# Patient Record
Sex: Male | Born: 1945 | Race: White | Hispanic: No | Marital: Married | Smoking: Former smoker
Health system: Southern US, Community
[De-identification: ages and names within clinical notes are randomized; demographics above are authoritative.]

## PROBLEM LIST (undated history)

## (undated) DIAGNOSIS — H6120 Impacted cerumen, unspecified ear: Secondary | ICD-10-CM

## (undated) DIAGNOSIS — I639 Cerebral infarction, unspecified: Secondary | ICD-10-CM

## (undated) DIAGNOSIS — E785 Hyperlipidemia, unspecified: Secondary | ICD-10-CM

## (undated) DIAGNOSIS — R7309 Other abnormal glucose: Secondary | ICD-10-CM

## (undated) HISTORY — DX: Hyperlipidemia, unspecified: E78.5

## (undated) HISTORY — DX: Impacted cerumen, unspecified ear: H61.20

## (undated) HISTORY — DX: Other abnormal glucose: R73.09

## (undated) HISTORY — DX: Cerebral infarction, unspecified: I63.9

## (undated) HISTORY — PX: TONSILLECTOMY: SUR1361

---

## 1999-08-21 ENCOUNTER — Encounter (INDEPENDENT_AMBULATORY_CARE_PROVIDER_SITE_OTHER): Payer: Self-pay | Admitting: *Deleted

## 1999-08-21 ENCOUNTER — Ambulatory Visit (HOSPITAL_BASED_OUTPATIENT_CLINIC_OR_DEPARTMENT_OTHER): Admission: RE | Admit: 1999-08-21 | Discharge: 1999-08-21 | Payer: Self-pay | Admitting: Urology

## 2003-06-05 ENCOUNTER — Ambulatory Visit (HOSPITAL_COMMUNITY): Admission: RE | Admit: 2003-06-05 | Discharge: 2003-06-05 | Payer: Self-pay | Admitting: Gastroenterology

## 2003-06-05 ENCOUNTER — Encounter (INDEPENDENT_AMBULATORY_CARE_PROVIDER_SITE_OTHER): Payer: Self-pay | Admitting: Specialist

## 2005-05-06 ENCOUNTER — Emergency Department (HOSPITAL_COMMUNITY): Admission: EM | Admit: 2005-05-06 | Discharge: 2005-05-06 | Payer: Self-pay | Admitting: Emergency Medicine

## 2008-09-27 LAB — HM COLONOSCOPY: HM Colonoscopy: NORMAL

## 2008-12-25 ENCOUNTER — Encounter: Payer: Self-pay | Admitting: Family Medicine

## 2009-06-06 ENCOUNTER — Ambulatory Visit: Payer: Self-pay | Admitting: Family Medicine

## 2009-06-06 DIAGNOSIS — E785 Hyperlipidemia, unspecified: Secondary | ICD-10-CM

## 2009-06-06 HISTORY — DX: Hyperlipidemia, unspecified: E78.5

## 2009-06-09 LAB — CONVERTED CEMR LAB
AST: 18 units/L (ref 0–37)
Alkaline Phosphatase: 32 units/L — ABNORMAL LOW (ref 39–117)
BUN: 13 mg/dL (ref 6–23)
CO2: 31 meq/L (ref 19–32)
Creatinine, Ser: 0.9 mg/dL (ref 0.4–1.5)
Eosinophils Relative: 4.1 % (ref 0.0–5.0)
GFR calc non Af Amer: 90.01 mL/min (ref >60)
HCT: 43.4 % (ref 39.0–52.0)
HDL: 30.1 mg/dL — ABNORMAL LOW (ref >39.00)
Neutro Abs: 2.9 10*3/uL (ref 1.4–7.7)
Platelets: 244 10*3/uL (ref 150.0–400.0)
Potassium: 5.1 meq/L (ref 3.5–5.1)
RBC: 4.78 M/uL (ref 4.22–5.81)
RDW: 12.6 % (ref 11.5–14.6)
Sodium: 140 meq/L (ref 135–145)
Total CHOL/HDL Ratio: 7
Triglycerides: 178 mg/dL — ABNORMAL HIGH (ref 0.0–149.0)

## 2009-06-18 ENCOUNTER — Telehealth: Payer: Self-pay | Admitting: Family Medicine

## 2009-07-23 ENCOUNTER — Telehealth: Payer: Self-pay | Admitting: Family Medicine

## 2009-07-25 ENCOUNTER — Ambulatory Visit: Payer: Self-pay | Admitting: Family Medicine

## 2009-07-25 ENCOUNTER — Telehealth: Payer: Self-pay | Admitting: Family Medicine

## 2009-07-30 ENCOUNTER — Ambulatory Visit: Payer: Self-pay | Admitting: Family Medicine

## 2009-07-30 ENCOUNTER — Telehealth: Payer: Self-pay | Admitting: Family Medicine

## 2009-08-22 ENCOUNTER — Ambulatory Visit: Payer: Self-pay | Admitting: Family Medicine

## 2009-08-22 DIAGNOSIS — H6120 Impacted cerumen, unspecified ear: Secondary | ICD-10-CM

## 2009-08-22 HISTORY — DX: Impacted cerumen, unspecified ear: H61.20

## 2010-07-06 ENCOUNTER — Ambulatory Visit: Payer: Self-pay | Admitting: Family Medicine

## 2010-07-06 LAB — CONVERTED CEMR LAB
Basophils Relative: 1.2 % (ref 0.0–3.0)
CO2: 30 meq/L (ref 19–32)
Calcium: 9.6 mg/dL (ref 8.4–10.5)
Chloride: 101 meq/L (ref 96–112)
Cholesterol: 206 mg/dL — ABNORMAL HIGH (ref 0–200)
Creatinine, Ser: 1 mg/dL (ref 0.4–1.5)
Eosinophils Absolute: 0.2 10*3/uL (ref 0.0–0.7)
GFR calc non Af Amer: 81.31 mL/min (ref >60)
Glucose, Bld: 104 mg/dL — ABNORMAL HIGH (ref 70–99)
HCT: 41.7 % (ref 39.0–52.0)
HDL: 30.1 mg/dL — ABNORMAL LOW (ref >39.00)
Hemoglobin: 14.2 g/dL (ref 13.0–17.0)
Lymphocytes Relative: 31.6 % (ref 12.0–46.0)
Monocytes Absolute: 0.5 10*3/uL (ref 0.1–1.0)
PSA: 0.48 ng/mL (ref 0.10–4.00)
Potassium: 4.8 meq/L (ref 3.5–5.1)
RBC: 4.57 M/uL (ref 4.22–5.81)
RDW: 13.5 % (ref 11.5–14.6)
Sodium: 138 meq/L (ref 135–145)
Specific Gravity, Urine: 1.02
TSH: 0.59 microintl units/mL (ref 0.35–5.50)
Total Protein: 6.6 g/dL (ref 6.0–8.3)
WBC Urine, dipstick: NEGATIVE
WBC: 5.6 10*3/uL (ref 4.5–10.5)
pH: 7

## 2010-07-13 ENCOUNTER — Ambulatory Visit: Payer: Self-pay | Admitting: Family Medicine

## 2010-07-13 DIAGNOSIS — R7309 Other abnormal glucose: Secondary | ICD-10-CM | POA: Insufficient documentation

## 2010-07-13 HISTORY — DX: Other abnormal glucose: R73.09

## 2010-07-13 LAB — CONVERTED CEMR LAB
Cholesterol, target level: 200 mg/dL
HDL goal, serum: 40 mg/dL
LDL Goal: 130 mg/dL

## 2010-10-15 NOTE — Assessment & Plan Note (Signed)
Summary: cpx//ccm   Vital Signs:  Patient profile:   65 year old male Height:      66 inches Weight:      172 pounds Temp:     98.4 degrees F oral BP sitting:   140 / 72  (left arm) Cuff size:   regular  Vitals Entered By: Sid Falcon LPN (July 13, 2010 8:55 AM)  History of Present Illness: Here for Medicare wellness visit.  Poor dietary compliance recently. colonoscopy last year.  No hx Shingles vaccine. Tetanus and PVX up to date.  Also has hx of prediabetes.  Father had Type 2 DM.  Pt without symptoms of hyperglycemia. Hx hyperlipidemia but not treated with meds.  Here for Medicare AWV:  1.   Risk factors based on Past M, S, F history:  Hx of hyperlipidemia and prediabetes.  FH of CAD in father but no premature CAD.  Father also had hx type 2 diabetes. 2.   Physical Activities: stays very active but no formal exercise. 3.   Depression/mood: No anxiety or depression issues. 4.   Hearing: No defecits..   5.   ADL's: Fully independent in all ADLs. 6.   Fall Risk: VEry low.  No recent falls.  No major orthopedic, vestibular, or visual difficulty (vision is corrected). 7.   Home Safety: no concerns identified. 8.   Height, weight, &visual acuity:  Some mild weight gain this year.  Ht stable.  regular eye checks.  no visual changes. 9.   Counseling: Needs to lose some weight and exercise more.  consider shingles vaccine . 10.   Labs ordered based on risk factors: Pt had lipids, BMP,CBC, TSH, PSA. 11.           Referral Coordination  No referrals indicated at this time. 12.           Care Plan  Flu vaccine. Rec consider shingles vaccine.  Colonoscopy up to date. 13.            Cognitive Assessment  No difficulty with short term, long term memory, or reasoning skills.   Lipid Management History:      Positive NCEP/ATP III risk factors include male age 70 years old or older and HDL cholesterol less than 40.  Negative NCEP/ATP III risk factors include non-diabetic, no family  history for ischemic heart disease, non-tobacco-user status, non-hypertensive, no ASHD (atherosclerotic heart disease), no prior stroke/TIA, no peripheral vascular disease, and no history of aortic aneurysm.     Preventive Screening-Counseling & Management  Alcohol-Tobacco     Smoking Status: never  Clinical Review Panels:  Prevention   Last Colonoscopy:  normal (09/13/2008)   Last PSA:  0.48 (07/06/2010)  Immunizations   Last Tetanus Booster:  Historical (09/13/2004)   Last Flu Vaccine:  Fluvax 3+ (07/13/2010)   Last Pneumovax:  Pneumovax (Medicare) (06/06/2009)  Lipid Management   Cholesterol:  206 (07/06/2010)   HDL (good cholesterol):  30.10 (07/06/2010)  Diabetes Management   Creatinine:  1.0 (07/06/2010)   Last Flu Vaccine:  Fluvax 3+ (07/13/2010)   Last Pneumovax:  Pneumovax (Medicare) (06/06/2009)  CBC   WBC:  5.6 (07/06/2010)   RBC:  4.57 (07/06/2010)   Hgb:  14.2 (07/06/2010)   Hct:  41.7 (07/06/2010)   Platelets:  250.0 (07/06/2010)   MCV  91.3 (07/06/2010)   MCHC  34.1 (07/06/2010)   RDW  13.5 (07/06/2010)   PMN:  55.0 (07/06/2010)   Lymphs:  31.6 (07/06/2010)   Monos:  8.7 (07/06/2010)  Eosinophils:  3.5 (07/06/2010)   Basophil:  1.2 (07/06/2010)  Complete Metabolic Panel   Glucose:  104 (07/06/2010)   Sodium:  138 (07/06/2010)   Potassium:  4.8 (07/06/2010)   Chloride:  101 (07/06/2010)   CO2:  30 (07/06/2010)   BUN:  16 (07/06/2010)   Creatinine:  1.0 (07/06/2010)   Albumin:  3.7 (07/06/2010)   Total Protein:  6.6 (07/06/2010)   Calcium:  9.6 (07/06/2010)   Total Bili:  0.5 (07/06/2010)   Alk Phos:  38 (07/06/2010)   SGPT (ALT):  36 (07/06/2010)   SGOT (AST):  24 (07/06/2010)   Allergies (verified): No Known Drug Allergies  Past History:  Past Medical History: Last updated: 06/06/2009 Hyperlipidemia  Past Surgical History: Last updated: 06/06/2009 Tonsillectomy 1955  Family History: Last updated: 07/13/2010 Family History of  Alcoholism/Addiction Family History of Stroke Father Family history heart disease Father 59 Father type 2 DM  Social History: Last updated: 06/06/2009 Retired Married Alcohol use-no Has smoked in the past  Family History: Family History of Alcoholism/Addiction Family History of Stroke Father Family history heart disease Father 46 Father type 2 DM  Social History: Smoking Status:  never  Review of Systems  The patient denies anorexia, fever, weight loss, vision loss, decreased hearing, hoarseness, chest pain, syncope, dyspnea on exertion, peripheral edema, prolonged cough, headaches, hemoptysis, abdominal pain, melena, hematochezia, severe indigestion/heartburn, hematuria, incontinence, genital sores, muscle weakness, suspicious skin lesions, transient blindness, difficulty walking, depression, unusual weight change, abnormal bleeding, enlarged lymph nodes, and testicular masses.    Physical Exam  General:  Well-developed,well-nourished,in no acute distress; alert,appropriate and cooperative throughout examination Head:  Normocephalic and atraumatic without obvious abnormalities. No apparent alopecia or balding. Eyes:  No corneal or conjunctival inflammation noted. EOMI. Perrla. Funduscopic exam benign, without hemorrhages, exudates or papilledema. Vision grossly normal. Ears:  External ear exam shows no significant lesions or deformities.  Otoscopic examination reveals clear canals, tympanic membranes are intact bilaterally without bulging, retraction, inflammation or discharge. Hearing is grossly normal bilaterally. Mouth:  Oral mucosa and oropharynx without lesions or exudates.  Teeth in good repair. Neck:  No deformities, masses, or tenderness noted. Lungs:  Normal respiratory effort, chest expands symmetrically. Lungs are clear to auscultation, no crackles or wheezes. Heart:  Normal rate and regular rhythm. S1 and S2 normal without gallop, murmur, click, rub or other extra  sounds. Abdomen:  Bowel sounds positive,abdomen soft and non-tender without masses, organomegaly or hernias noted. Rectal:  colonoscopy 2010 Msk:  No deformity or scoliosis noted of thoracic or lumbar spine.   Extremities:  No clubbing, cyanosis, edema, or deformity noted with normal full range of motion of all joints.   Neurologic:  alert & oriented X3 and cranial nerves II-XII intact.   Skin:  no rashes and no suspicious lesions.   Cervical Nodes:  No lymphadenopathy noted Psych:  normally interactive, good eye contact, not anxious appearing, and not depressed appearing.     Impression & Recommendations:  Problem # 1:  ROUTINE GENERAL MEDICAL EXAM@[email or web address]  CARE FACL (ICD-V70.0)  Orders: Medicare -1st Annual Wellness Visit [phone])  Problem # 2:  HYPERLIPIDEMIA (ICD-272.4) discussed pros and cons of medical therapy and he is not interested at this time.  Problem # 3:  PREDIABETES (ICD-790.29) diet and exercise discussed.  Cont to monitor.  Complete Medication List: 1)  Aspirin 81 Mg Tabs (Aspirin) .... Once daily 2)  Bactroban 2 % Crea (Mupirocin calcium) .... Use as directed three times a day as needed  Other Orders:  Flu Vaccine 73yrs + MEDICARE PATIENTS (J4782) Administration Flu vaccine - MCR (G0008)  Lipid Assessment/Plan:      Based on NCEP/ATP III, the patient's risk factor category is "2 or more risk factors and a calculated 10 year CAD risk of > 20%".  The patient's lipid goals are as follows: Total cholesterol goal is 200; LDL cholesterol goal is 130; HDL cholesterol goal is 40; Triglyceride goal is 150.    Patient Instructions: 1)  It is important that you exercise reguarly at least 20 minutes 5 times a week. If you develop chest pain, have severe difficulty breathing, or feel very tired, stop exercising immediately and seek medical attention.  2)  Consider shingles vaccine (Zostavax)   Orders Added: 1)  Flu Vaccine 32yrs + MEDICARE PATIENTS [Q2039] 2)  Administration  Flu vaccine - MCR [G0008] 3)  Medicare -1st Annual Wellness Visit [G0438] 4)  Est. Patient Level III [95621]    Flu Vaccine Consent Questions     Do you have a history of severe allergic reactions to this vaccine? no    Any prior history of allergic reactions to egg and/or gelatin? no    Do you have a sensitivity to the preservative Thimersol? no    Do you have a past history of Guillan-Barre Syndrome? no    Do you currently have an acute febrile illness? no    Have you ever had a severe reaction to latex? no    Vaccine information given and explained to patient? yes    Are you currently pregnant? no    Lot Number:[id number]   Exp Date:03/13/2011   Site Given  Left Deltoid [id number]

## 2011-01-29 NOTE — Op Note (Signed)
[place]  Patient:    James Pugh                         MRN: [id number] Proc. Date: 08/21/99 Adm. Date:  [id number] Attending:  Ellwood Handler                           Operative Report  DATE OF BIRTH:  1946-05-16  PREOPERATIVE DIAGNOSIS:  Phimosis and balanitis.  POSTOPERATIVE DIAGNOSIS:  Phimosis and balanitis.  PROCEDURE:  Circumcision.  SURGEON:  Verl Dicker, M.D.  ANESTHESIA:  General.  DRAINS:  None.  COMPLICATIONS:  None.  DESCRIPTION OF PROCEDURE:  The patient was prepped and draped in the supine position after institution of an adequate level of general anesthesia by LMA. A  circumferential incision was made proximal to the subcoronal sulcus.  A similar  incision was made proximal to the original incision and a ring of fibrotic preputial skin was removed in a "parallel lines" technique.  Bleeding sites were lightly fulgurated using needle-tip cautery.  The subcutaneous was reapproximated with 4-0 Vicryl.  The skin was reapproximated with interrupted sutures of  4-0 chromic.  The wound was covered with Bacitracin ointment, Telfa gauze and Coban tape.  The patient was returned to recovery in satisfactory condition. DD:  08/21/99 TD:  08/22/99 Job: [id number]

## 2011-01-29 NOTE — Op Note (Signed)
NAMEDESHANNON, Pugh                         ACCOUNT NO.:  [id number]   MEDICAL RECORD NO.:  [id number]                   PATIENT TYPE:  AMB   LOCATION:  ENDO                                 FACILITY:  [facility]   PHYSICIAN:  Bernette Redbird, M.D.                DATE OF BIRTH:  Sep 16, 1945   DATE OF PROCEDURE:  06/05/2003  DATE OF DISCHARGE:                                 OPERATIVE REPORT   PROCEDURE:  Colonoscopy with biopsy.   ENDOSCOPIST:  Bernette Redbird, M.D.   INDICATIONS FOR PROCEDURE:  A 65 year old with heme-positive stool and  occasional rectal bleeding felt to be of hemorrhoidal origin.   FINDINGS:  Slight colonic friability in different places.  Mild-to-moderate  internal hemorrhoids.  Submucosal (probable lipoma) lesion at 20 cm.   DESCRIPTION OF PROCEDURE:  The nature and purpose and risks of the procedure  have been discussed with the patient who provided consent.  Sedation:  Fentanyl 100 mcg and Versed 10 mg IV prior to and during the course of the  procedure without arrhythmias or desaturation.  The Olympus adjustable  tension pediatric videocolonoscope was readily advanced to the terminal  ileum which had a normal appearance and which pull back was then performed.   This was essentially a normal examination.  At 20 cm there was a 1 cm  submucosal soft lesion, felt to probably represent a lipoma.  Several  biopsies were obtained. The overlying mucosa was normal.   No specific polyps apart from that lesion were seen nor did I see any  evidence of cancer, colitis, vascular malformations or diverticulosis.  Retroflexion in the rectum and reinspection of the rectosigmoid was  otherwise unremarkable other than some skid marks and contact mucosal  friability in the rectum and the region of the rectosigmoid junction and  also there was 1 red spot at a fulcrum point in the midcolon possibly  representing a small vascular malformation, but more likely represents a  mild  scope trauma.   The patient tolerated the procedure well and there were no recurrent  complications.   IMPRESSION:  Transiently Hemoccult-positive stool, and more recently  Hemoccult-negative, with no definite source seen on this exam.  Various  areas of friability or contact mucosal fragility noted, not felt to be  clinically significant, not correlated with any background endoscopic  evidence of colitis such as loss of vascularity.  Submucosal lesion at 20  cm, probably not clinically significant.    PLAN:  1. Await pathology results.  2. Consider follow up colonoscopy in 5 years for ongoing screening.                                               Bernette Redbird, M.D.    RB/MEDQ  D:  06/05/2003  T:  06/05/2003  Job:  [id number]   cc:   James Pugh, M.D.  [address]  [place]  [place] [address]  Fax: [phone]

## 2011-04-08 ENCOUNTER — Other Ambulatory Visit: Payer: Self-pay | Admitting: Dermatology

## 2011-07-29 ENCOUNTER — Encounter: Payer: Self-pay | Admitting: Family Medicine

## 2011-07-29 ENCOUNTER — Encounter: Payer: Self-pay | Admitting: *Deleted

## 2011-08-04 ENCOUNTER — Other Ambulatory Visit: Payer: Self-pay

## 2011-08-11 ENCOUNTER — Ambulatory Visit (INDEPENDENT_AMBULATORY_CARE_PROVIDER_SITE_OTHER): Payer: Medicare Other | Admitting: Family Medicine

## 2011-08-11 ENCOUNTER — Encounter: Payer: Self-pay | Admitting: Family Medicine

## 2011-08-11 DIAGNOSIS — R7309 Other abnormal glucose: Secondary | ICD-10-CM

## 2011-08-11 DIAGNOSIS — Z0000 Encounter for general adult medical examination without abnormal findings: Secondary | ICD-10-CM

## 2011-08-11 DIAGNOSIS — Z23 Encounter for immunization: Secondary | ICD-10-CM

## 2011-08-11 DIAGNOSIS — E785 Hyperlipidemia, unspecified: Secondary | ICD-10-CM

## 2011-08-11 DIAGNOSIS — N429 Disorder of prostate, unspecified: Secondary | ICD-10-CM

## 2011-08-11 LAB — PSA: PSA: 0.6 ng/mL (ref 0.10–4.00)

## 2011-08-11 LAB — HEPATIC FUNCTION PANEL
ALT: 28 U/L (ref 0–53)
AST: 27 U/L (ref 0–37)
Alkaline Phosphatase: 41 U/L (ref 39–117)
Total Protein: 7.1 g/dL (ref 6.0–8.3)

## 2011-08-11 LAB — BASIC METABOLIC PANEL
BUN: 15 mg/dL (ref 6–23)
Potassium: 5.9 mEq/L — ABNORMAL HIGH (ref 3.5–5.1)
Sodium: 141 mEq/L (ref 135–145)

## 2011-08-11 LAB — CBC WITH DIFFERENTIAL/PLATELET
Eosinophils Absolute: 0.2 10*3/uL (ref 0.0–0.7)
HCT: 42 % (ref 39.0–52.0)
Hemoglobin: 14.1 g/dL (ref 13.0–17.0)
Lymphs Abs: 1.8 10*3/uL (ref 0.7–4.0)
MCV: 91.4 fl (ref 78.0–100.0)
Monocytes Absolute: 0.4 10*3/uL (ref 0.1–1.0)
Neutro Abs: 2.5 10*3/uL (ref 1.4–7.7)
Platelets: 261 10*3/uL (ref 150.0–400.0)
WBC: 5 10*3/uL (ref 4.5–10.5)

## 2011-08-11 NOTE — Progress Notes (Signed)
Subjective:    Patient ID: James Pugh, male    DOB: Mar 06, 1946, 65 y.o.   MRN: [id number]  HPI  Patient here for Medicare wellness exam and medical followup. History of mild hyperlipidemia. No regular medications. Borderline elevated blood pressure. By home monitoring usually run 130 systolic. No recent dizziness or headaches. No chest pains. History of prediabetes. No symptoms of hyperglycemia.  1.  Risk factors based on Past Medical , Social, and Family history  All reviewed as recorded below 2.  Limitations in physical activities stays very active physically. Low risk for falls 3.  Depression/moodno depression or anxiety issues 4.  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  ADLs fully independent in all 6.  Cognitive function (orientation to time and place, language, writing, speech,memory) no short or long-term memory deficits. Recesses. Judgment intact 7.  Home Safety no issues identified 8.  Height, weight, and visual acuity. 9.  Counseling continue regular exercise. Close monitoring of blood pressure and be in touch if consistently over 140/90. 10. Recommendation of preventive services. Confirm prior shingles vaccine. Flu vaccine given 11. Labs based on risk factors PSA, BMP, lipid, hepatic,cbc 12. Care Plan as above.  Past Medical History  Diagnosis Date  . HYPERLIPIDEMIA 06/06/2009  . CERUMEN IMPACTION 08/22/2009  . PREDIABETES 07/13/2010   Past Surgical History  Procedure Date  . Tonsillectomy     reports that he quit smoking about 28 years ago. His smoking use included Cigarettes. He has a 20 pack-year smoking history. He does not have any smokeless tobacco history on file. His alcohol and drug histories not on file. family history includes Alcohol abuse in his other; Diabetes in his father; Heart disease in his father; and Stroke in his father. No Known Allergies     Review of Systems    Constitutional: Negative for fever, activity change, appetite change and fatigue.  HENT: Negative for ear pain, congestion and trouble swallowing.   Eyes: Negative for pain and visual disturbance.  Respiratory: Negative for cough, shortness of breath and wheezing.   Cardiovascular: Negative for chest pain and palpitations.  Gastrointestinal: Negative for nausea, vomiting, abdominal pain, diarrhea, constipation, blood in stool, abdominal distention and rectal pain.  Genitourinary: Negative for dysuria, hematuria and testicular pain.  Musculoskeletal: Negative for joint swelling and arthralgias.  Skin: Negative for rash.  Neurological: Negative for dizziness, syncope and headaches.  Hematological: Negative for adenopathy.  Psychiatric/Behavioral: Negative for confusion and dysphoric mood.       Objective:   Physical Exam  Constitutional: He is oriented to person, place, and time. He appears well-developed and well-nourished. No distress.  HENT:  Head: Normocephalic and atraumatic.  Right Ear: External ear normal.  Mouth/Throat: Oropharynx is clear and moist.       He has some dried blood at posterior aspect left TM. Relates ear irrigation per ENT last week  Eyes: Conjunctivae and EOM are normal. Pupils are equal, round, and reactive to light.  Neck: Normal range of motion. Neck supple. No thyromegaly present.  Cardiovascular: Normal rate, regular rhythm and normal heart sounds.   No murmur heard. Pulmonary/Chest: No respiratory distress. He has no wheezes. He has no rales.  Abdominal: Soft. Bowel sounds are normal. He exhibits no distension and no mass. There is no tenderness. There is no rebound and no guarding.  Genitourinary: Rectum normal.       Prostate minimally enlarged no nodules. No asymmetry  Musculoskeletal: He exhibits no edema.  Lymphadenopathy:    He has no cervical adenopathy.  Neurological: He is alert and oriented to person, place, and time. He displays normal  reflexes. No cranial nerve deficit.  Skin: No rash noted.  Psychiatric: He has a normal mood and affect.          Assessment & Plan:  #1 Health maintenance.  Check on Shingles vaccine coverage.  Flu vaccine given.  Regular exercise. #2 History of prediabetes.  Reassess CBG. #3 hyperlipidemia.  Reassess. #4 Borderline elevated blood pressure.  Monitor.

## 2011-08-13 ENCOUNTER — Other Ambulatory Visit: Payer: Self-pay | Admitting: Family Medicine

## 2011-08-13 DIAGNOSIS — E785 Hyperlipidemia, unspecified: Secondary | ICD-10-CM

## 2011-08-13 NOTE — Progress Notes (Signed)
Quick Note:  Pt informed on home VM, copy mailed to pt home with instructions highlighted, future labs ordered ______

## 2011-10-21 DIAGNOSIS — H5710 Ocular pain, unspecified eye: Secondary | ICD-10-CM | POA: Diagnosis not present

## 2011-10-21 DIAGNOSIS — T1510XA Foreign body in conjunctival sac, unspecified eye, initial encounter: Secondary | ICD-10-CM | POA: Diagnosis not present

## 2011-11-25 ENCOUNTER — Encounter: Payer: Self-pay | Admitting: Family Medicine

## 2011-11-25 ENCOUNTER — Ambulatory Visit (INDEPENDENT_AMBULATORY_CARE_PROVIDER_SITE_OTHER): Payer: Medicare Other | Admitting: Family Medicine

## 2011-11-25 VITALS — BP 108/58 | Temp 99.0°F | Wt 168.0 lb

## 2011-11-25 DIAGNOSIS — J209 Acute bronchitis, unspecified: Secondary | ICD-10-CM

## 2011-11-25 MED ORDER — AZITHROMYCIN 250 MG PO TABS: ORAL_TABLET | ORAL | Status: AC

## 2011-11-25 MED ORDER — HYDROCOD POLST-CHLORPHEN POLST 10-8 MG/5ML PO LQCR: 5.0000 mL | Freq: Two times a day (BID) | ORAL | Status: DC | PRN

## 2011-11-25 NOTE — Progress Notes (Signed)
  Subjective:    Patient ID: James Pugh, male    DOB: [birth year], 66 y.o.   MRN: [id number]  HPI  Acute illness. 2 weeks of upper respiratory symptoms. Initially had sore throat followed by nasal congestion and now productive cough. Cough is especially bothersome at night. Not relieved with over-the-counter cough medicines. No fever. No chills. Denies nausea/ vomiting. Sore throat symptoms have improved. Patient is nonsmoker.   Review of Systems  Constitutional: Positive for fatigue. Negative for fever and chills.  HENT: Positive for congestion, postnasal drip and sinus pressure.   Respiratory: Positive for cough. Negative for shortness of breath and wheezing.   Cardiovascular: Negative for chest pain.       Objective:   Physical Exam  Constitutional: He appears well-developed and well-nourished. No distress.  HENT:  Right Ear: External ear normal.  Left Ear: External ear normal.  Mouth/Throat: Oropharynx is clear and moist.  Neck: Neck supple. No thyromegaly present.  Cardiovascular: Normal rate and regular rhythm.   Pulmonary/Chest: Effort normal and breath sounds normal. No respiratory distress. He has no wheezes. He has no rales.  Lymphadenopathy:    He has no cervical adenopathy.          Assessment & Plan:  Acute bronchitis/sinusitis. Tussionex for nighttime cough. Start Zithromax. Followup in one to 2 weeks if no better

## 2011-11-25 NOTE — Patient Instructions (Signed)

## 2011-12-24 ENCOUNTER — Ambulatory Visit (INDEPENDENT_AMBULATORY_CARE_PROVIDER_SITE_OTHER)
Admission: RE | Admit: 2011-12-24 | Discharge: 2011-12-24 | Disposition: A | Discharge status: Home / Self Care | Payer: Medicare Other | Source: Ambulatory Visit | Attending: Family Medicine | Admitting: Family Medicine

## 2011-12-24 ENCOUNTER — Ambulatory Visit (INDEPENDENT_AMBULATORY_CARE_PROVIDER_SITE_OTHER): Payer: Medicare Other | Admitting: Family Medicine

## 2011-12-24 ENCOUNTER — Encounter: Payer: Self-pay | Admitting: Family Medicine

## 2011-12-24 VITALS — BP 120/70 | Temp 98.0°F | Wt 163.0 lb

## 2011-12-24 DIAGNOSIS — R05 Cough: Secondary | ICD-10-CM

## 2011-12-24 DIAGNOSIS — R053 Chronic cough: Secondary | ICD-10-CM

## 2011-12-24 DIAGNOSIS — R059 Cough, unspecified: Secondary | ICD-10-CM

## 2011-12-24 DIAGNOSIS — E785 Hyperlipidemia, unspecified: Secondary | ICD-10-CM | POA: Diagnosis not present

## 2011-12-24 DIAGNOSIS — M47814 Spondylosis without myelopathy or radiculopathy, thoracic region: Secondary | ICD-10-CM | POA: Diagnosis not present

## 2011-12-24 LAB — LIPID PANEL
Cholesterol: 190 mg/dL (ref 0–200)
HDL: 28.5 mg/dL — ABNORMAL LOW (ref >39.00)
LDL Cholesterol: 136 mg/dL — ABNORMAL HIGH (ref 0–99)
Total CHOL/HDL Ratio: 7
Triglycerides: 129 mg/dL (ref 0.0–149.0)
VLDL: 25.8 mg/dL (ref 0.0–40.0)

## 2011-12-24 MED ORDER — BENZONATATE 200 MG PO CAPS: 200.0000 mg | ORAL_CAPSULE | Freq: Three times a day (TID) | ORAL | Status: AC | PRN

## 2011-12-24 MED ORDER — ONDANSETRON HCL 8 MG PO TABS: 8.0000 mg | ORAL_TABLET | Freq: Three times a day (TID) | ORAL | Status: AC | PRN

## 2011-12-24 NOTE — Progress Notes (Signed)
  Subjective:    Patient ID: James Pugh, male    DOB: 1946/01/22, 66 y.o.   MRN: [id number]  HPI  Persistent cough. Onset about 6 weeks ago. Quit smoking in 1984. Cough is dry. We treated initially with Zithromax and Tussionex  which did help briefly. He had about 6 pounds of weight loss but this is due to his efforts. He denies any dyspnea. No postnasal drip symptoms.  Mucinex without relief. Denies any fever or chills. No hemoptysis. No pleuritic pain. No wheezing. No GERD symptoms. No history of reactive airway problems.   Review of Systems  Constitutional: Negative for fever and chills.  HENT: Negative for congestion and postnasal drip.   Respiratory: Positive for cough. Negative for shortness of breath and wheezing.   Cardiovascular: Negative for chest pain, palpitations and leg swelling.  Neurological: Negative for headaches.  Hematological: Negative for adenopathy.       Objective:   Physical Exam  Constitutional: He appears well-developed and well-nourished.  HENT:  Mouth/Throat: Oropharynx is clear and moist.  Neck: Neck supple.  Cardiovascular: Normal rate and regular rhythm.   Pulmonary/Chest: Effort normal and breath sounds normal. No respiratory distress. He has no wheezes. He has no rales.  Lymphadenopathy:    He has no cervical adenopathy.          Assessment & Plan:  Chronic cough. Most likely post viral. Nonfocal exam. He has not any obvious GERD symptoms or evidence for reactive airway component. No obvious postnasal drip symptoms. We did recommend consideration for chlorpheniramine 4 mg at night. Chest x-ray obtained. No indication for further antibiotics. Try Tessalon Perles 200 mg every 8 hours when necessary

## 2011-12-24 NOTE — Patient Instructions (Signed)
Consider chlorpheniramine 4mg  at night for any postnasal drip symptoms.

## 2011-12-27 ENCOUNTER — Telehealth: Payer: Self-pay | Admitting: Family Medicine

## 2011-12-27 NOTE — Telephone Encounter (Signed)
Patient called stating that he would like a call with his cxr results. Please assist.

## 2011-12-27 NOTE — Telephone Encounter (Signed)
Pt informed

## 2011-12-27 NOTE — Progress Notes (Signed)
Quick Note:  Pt informed, requested copy, mailed ______

## 2011-12-27 NOTE — Progress Notes (Signed)
Quick Note:  Pt informed ______ 

## 2011-12-28 ENCOUNTER — Other Ambulatory Visit: Payer: Medicare Other

## 2012-03-29 DIAGNOSIS — H02839 Dermatochalasis of unspecified eye, unspecified eyelid: Secondary | ICD-10-CM | POA: Diagnosis not present

## 2012-03-29 DIAGNOSIS — H2510 Age-related nuclear cataract, unspecified eye: Secondary | ICD-10-CM | POA: Diagnosis not present

## 2012-03-29 DIAGNOSIS — H40019 Open angle with borderline findings, low risk, unspecified eye: Secondary | ICD-10-CM | POA: Diagnosis not present

## 2012-03-29 DIAGNOSIS — H04129 Dry eye syndrome of unspecified lacrimal gland: Secondary | ICD-10-CM | POA: Diagnosis not present

## 2012-04-19 DIAGNOSIS — L821 Other seborrheic keratosis: Secondary | ICD-10-CM | POA: Diagnosis not present

## 2012-04-19 DIAGNOSIS — D239 Other benign neoplasm of skin, unspecified: Secondary | ICD-10-CM | POA: Diagnosis not present

## 2012-04-19 DIAGNOSIS — D1801 Hemangioma of skin and subcutaneous tissue: Secondary | ICD-10-CM | POA: Diagnosis not present

## 2012-04-19 DIAGNOSIS — L570 Actinic keratosis: Secondary | ICD-10-CM | POA: Diagnosis not present

## 2012-05-04 ENCOUNTER — Ambulatory Visit (INDEPENDENT_AMBULATORY_CARE_PROVIDER_SITE_OTHER): Payer: Medicare Other | Admitting: Family Medicine

## 2012-05-04 ENCOUNTER — Encounter: Payer: Self-pay | Admitting: Family Medicine

## 2012-05-04 VITALS — BP 120/78 | Temp 98.2°F | Wt 166.0 lb

## 2012-05-04 DIAGNOSIS — H6120 Impacted cerumen, unspecified ear: Secondary | ICD-10-CM

## 2012-05-04 NOTE — Progress Notes (Signed)
  Subjective:    Patient ID: James Pugh, male    DOB: [birth year], 66 y.o.   MRN: [id number]  HPI  Patient seen with concern for possible fluid in both ears left greater than right. She's had history of cerumen buildup in the past. Denies any vertigo. No hearing loss. No pain. No drainage. No fever or chills. He had no recent nasal congestion symptoms. No alleviating factors.   Review of Systems  Constitutional: Negative for fever and chills.  HENT: Negative for ear pain, congestion, tinnitus and ear discharge.   Respiratory: Negative for cough.   Neurological: Negative for dizziness and headaches.       Objective:   Physical Exam  Constitutional: He appears well-developed and well-nourished.  HENT:       Minimal cerumen in both canals. Removed by irrigation. Eardrums appear normal  Neck: Neck supple. No thyromegaly present.  Cardiovascular: Normal rate and regular rhythm.           Assessment & Plan:  Bilateral cerumen. Removed by irrigation. Patient symptomatically improved afterwards.

## 2012-08-02 DIAGNOSIS — Z23 Encounter for immunization: Secondary | ICD-10-CM | POA: Diagnosis not present

## 2012-08-11 ENCOUNTER — Encounter: Payer: Self-pay | Admitting: Family Medicine

## 2012-10-13 ENCOUNTER — Ambulatory Visit (INDEPENDENT_AMBULATORY_CARE_PROVIDER_SITE_OTHER): Payer: Medicare Other | Admitting: Family Medicine

## 2012-10-13 ENCOUNTER — Encounter: Payer: Self-pay | Admitting: Family Medicine

## 2012-10-13 VITALS — BP 120/70 | HR 72 | Temp 98.3°F | Resp 12 | Ht 66.0 in | Wt 168.0 lb

## 2012-10-13 DIAGNOSIS — Z0000 Encounter for general adult medical examination without abnormal findings: Secondary | ICD-10-CM | POA: Diagnosis not present

## 2012-10-13 DIAGNOSIS — R7309 Other abnormal glucose: Secondary | ICD-10-CM

## 2012-10-13 DIAGNOSIS — E785 Hyperlipidemia, unspecified: Secondary | ICD-10-CM

## 2012-10-13 DIAGNOSIS — Z125 Encounter for screening for malignant neoplasm of prostate: Secondary | ICD-10-CM

## 2012-10-13 DIAGNOSIS — N320 Bladder-neck obstruction: Secondary | ICD-10-CM | POA: Diagnosis not present

## 2012-10-13 LAB — LIPID PANEL
Cholesterol: 227 mg/dL — ABNORMAL HIGH (ref 0–200)
HDL: 31.2 mg/dL — ABNORMAL LOW (ref >39.00)
Total CHOL/HDL Ratio: 7
VLDL: 49.2 mg/dL — ABNORMAL HIGH (ref 0.0–40.0)

## 2012-10-13 LAB — HEPATIC FUNCTION PANEL
AST: 19 U/L (ref 0–37)
Albumin: 4 g/dL (ref 3.5–5.2)
Bilirubin, Direct: 0.1 mg/dL (ref 0.0–0.3)
Total Protein: 7.1 g/dL (ref 6.0–8.3)

## 2012-10-13 LAB — BASIC METABOLIC PANEL
BUN: 13 mg/dL (ref 6–23)
Glucose, Bld: 107 mg/dL — ABNORMAL HIGH (ref 70–99)
Potassium: 4.5 mEq/L (ref 3.5–5.1)

## 2012-10-13 NOTE — Progress Notes (Signed)
Subjective:    Patient ID: James Pugh, male    DOB: 1946-06-13, 67 y.o.   MRN: [id number]  HPI Patient here for Medicare wellness exam and medical followup. He has history of prediabetes and hyperlipidemia. He's been able to manage his lipids with lifestyle control. Immunizations up to date with exception of can't document shingles vaccine -though he thinks he's had previously. Colonoscopy up to date. No chronic medications. No formal exercise. No recent appetite or weight changes. No chest pains. No dizziness. Nonsmoker.  Past Medical History  Diagnosis Date  . HYPERLIPIDEMIA 06/06/2009  . CERUMEN IMPACTION 08/22/2009  . PREDIABETES 07/13/2010   Past Surgical History  Procedure Date  . Tonsillectomy     reports that he quit smoking about 29 years ago. His smoking use included Cigarettes. He has a 20 pack-year smoking history. He does not have any smokeless tobacco history on file. His alcohol and drug histories not on file. family history includes Alcohol abuse in his other; Diabetes in his father; Heart disease in his father; and Stroke in his father. No Known Allergies  1.  Risk factors based on Past Medical , Social, and Family history reviewed as above 2.  Limitations in physical activities low risk for fall. 3.  Depression/mood no depression or anxiety issues 4.  Hearing no deficits 5.  ADLs independent in all 6.  Cognitive function (orientation to time and place, language, writing, speech,memory) no memory deficits. Language and judgment intact 7.  Home Safety no issues 8.  Height, weight, and visual acuity. All stable 9.  Counseling discussed regular exercise and reduction in saturated fats 10. Recommendation of preventive services. Immunizations up to date. Confirm prior shingles vaccine 11. Labs based on risk factors basic metabolic panel, lipid panel, PSA 12. Care Plan as above    Review of Systems  Constitutional: Negative for fever, activity change, appetite  change and fatigue.  HENT: Negative for ear pain, congestion and trouble swallowing.   Eyes: Negative for pain and visual disturbance.  Respiratory: Negative for cough, shortness of breath and wheezing.   Cardiovascular: Negative for chest pain and palpitations.  Gastrointestinal: Negative for nausea, vomiting, abdominal pain, diarrhea, constipation, blood in stool, abdominal distention and rectal pain.  Genitourinary: Negative for dysuria, hematuria and testicular pain.  Musculoskeletal: Negative for joint swelling and arthralgias.  Skin: Negative for rash.  Neurological: Negative for dizziness, syncope and headaches.  Hematological: Negative for adenopathy.  Psychiatric/Behavioral: Negative for confusion and dysphoric mood.       Objective:   Physical Exam  Constitutional: He is oriented to person, place, and time. He appears well-developed and well-nourished. No distress.  HENT:  Head: Normocephalic and atraumatic.  Right Ear: External ear normal.  Left Ear: External ear normal.  Mouth/Throat: Oropharynx is clear and moist.  Eyes: Conjunctivae normal and EOM are normal. Pupils are equal, round, and reactive to light.  Neck: Normal range of motion. Neck supple. No thyromegaly present.  Cardiovascular: Normal rate, regular rhythm and normal heart sounds.   No murmur heard. Pulmonary/Chest: No respiratory distress. He has no wheezes. He has no rales.  Abdominal: Soft. Bowel sounds are normal. He exhibits no distension and no mass. There is no tenderness. There is no rebound and no guarding.  Genitourinary:       Prostate is mildly enlarged but symmetric with no nodules  Musculoskeletal: He exhibits no edema.  Lymphadenopathy:    He has no cervical adenopathy.  Neurological: He is alert and oriented to person,  place, and time. He displays normal reflexes. No cranial nerve deficit.  Skin: No rash noted.  Psychiatric: He has a normal mood and affect.          Assessment &  Plan:  Complete physical. Generally healthy 67 year old male. Check labs above. Confirm prior shingles. Colonoscopy up to date. History prediabetes. Work on establishing more consistent exercise and weight control. Hyperlipidemia.  Discussed dietary issues. He is reluctant to idea of statins

## 2012-12-25 DIAGNOSIS — H2510 Age-related nuclear cataract, unspecified eye: Secondary | ICD-10-CM | POA: Diagnosis not present

## 2012-12-25 DIAGNOSIS — H53009 Unspecified amblyopia, unspecified eye: Secondary | ICD-10-CM | POA: Diagnosis not present

## 2012-12-25 DIAGNOSIS — H25019 Cortical age-related cataract, unspecified eye: Secondary | ICD-10-CM | POA: Diagnosis not present

## 2012-12-25 DIAGNOSIS — H5200 Hypermetropia, unspecified eye: Secondary | ICD-10-CM | POA: Diagnosis not present

## 2013-04-19 ENCOUNTER — Other Ambulatory Visit: Payer: Self-pay | Admitting: Dermatology

## 2013-04-19 DIAGNOSIS — C44519 Basal cell carcinoma of skin of other part of trunk: Secondary | ICD-10-CM | POA: Diagnosis not present

## 2013-04-19 DIAGNOSIS — D1739 Benign lipomatous neoplasm of skin and subcutaneous tissue of other sites: Secondary | ICD-10-CM | POA: Diagnosis not present

## 2013-04-19 DIAGNOSIS — D1801 Hemangioma of skin and subcutaneous tissue: Secondary | ICD-10-CM | POA: Diagnosis not present

## 2013-04-19 DIAGNOSIS — D485 Neoplasm of uncertain behavior of skin: Secondary | ICD-10-CM | POA: Diagnosis not present

## 2013-08-14 ENCOUNTER — Other Ambulatory Visit: Payer: Self-pay | Admitting: Dermatology

## 2013-08-14 DIAGNOSIS — C4441 Basal cell carcinoma of skin of scalp and neck: Secondary | ICD-10-CM | POA: Diagnosis not present

## 2013-08-14 DIAGNOSIS — D485 Neoplasm of uncertain behavior of skin: Secondary | ICD-10-CM | POA: Diagnosis not present

## 2013-08-14 DIAGNOSIS — C44319 Basal cell carcinoma of skin of other parts of face: Secondary | ICD-10-CM | POA: Diagnosis not present

## 2013-08-28 DIAGNOSIS — Z85828 Personal history of other malignant neoplasm of skin: Secondary | ICD-10-CM | POA: Diagnosis not present

## 2013-08-28 DIAGNOSIS — C44319 Basal cell carcinoma of skin of other parts of face: Secondary | ICD-10-CM | POA: Diagnosis not present

## 2013-09-10 DIAGNOSIS — M25819 Other specified joint disorders, unspecified shoulder: Secondary | ICD-10-CM | POA: Diagnosis not present

## 2013-10-16 ENCOUNTER — Ambulatory Visit (INDEPENDENT_AMBULATORY_CARE_PROVIDER_SITE_OTHER): Payer: Medicare Other | Admitting: Family Medicine

## 2013-10-16 ENCOUNTER — Encounter: Payer: Self-pay | Admitting: Family Medicine

## 2013-10-16 VITALS — BP 130/70 | HR 66 | Temp 97.4°F | Ht 66.0 in | Wt 171.0 lb

## 2013-10-16 DIAGNOSIS — N320 Bladder-neck obstruction: Secondary | ICD-10-CM | POA: Diagnosis not present

## 2013-10-16 DIAGNOSIS — Z0000 Encounter for general adult medical examination without abnormal findings: Secondary | ICD-10-CM

## 2013-10-16 DIAGNOSIS — E785 Hyperlipidemia, unspecified: Secondary | ICD-10-CM

## 2013-10-16 DIAGNOSIS — R7309 Other abnormal glucose: Secondary | ICD-10-CM

## 2013-10-16 LAB — LIPID PANEL
CHOL/HDL RATIO: 7
Cholesterol: 240 mg/dL — ABNORMAL HIGH (ref 0–200)
HDL: 36 mg/dL — AB (ref >39.00)
Triglycerides: 283 mg/dL — ABNORMAL HIGH (ref 0.0–149.0)
VLDL: 56.6 mg/dL — ABNORMAL HIGH (ref 0.0–40.0)

## 2013-10-16 LAB — HEPATIC FUNCTION PANEL
ALK PHOS: 42 U/L (ref 39–117)
ALT: 23 U/L (ref 0–53)
AST: 19 U/L (ref 0–37)
Albumin: 3.9 g/dL (ref 3.5–5.2)
BILIRUBIN DIRECT: 0.1 mg/dL (ref 0.0–0.3)
BILIRUBIN TOTAL: 0.8 mg/dL (ref 0.3–1.2)
TOTAL PROTEIN: 6.9 g/dL (ref 6.0–8.3)

## 2013-10-16 LAB — PSA: PSA: 0.77 ng/mL (ref 0.10–4.00)

## 2013-10-16 LAB — BASIC METABOLIC PANEL
BUN: 14 mg/dL (ref 6–23)
CALCIUM: 9.9 mg/dL (ref 8.4–10.5)
CHLORIDE: 102 meq/L (ref 96–112)
CO2: 28 meq/L (ref 19–32)
CREATININE: 0.9 mg/dL (ref 0.4–1.5)
GFR: 87.71 mL/min (ref >60.00)
GLUCOSE: 99 mg/dL (ref 70–99)
Potassium: 4.4 mEq/L (ref 3.5–5.1)
SODIUM: 137 meq/L (ref 135–145)

## 2013-10-16 LAB — LDL CHOLESTEROL, DIRECT: Direct LDL: 132.6 mg/dL

## 2013-10-16 NOTE — Progress Notes (Signed)
Pre visit review using our clinic review tool, if applicable. No additional management support is needed unless otherwise documented below in the visit note. 

## 2013-10-16 NOTE — Progress Notes (Signed)
Subjective:    Patient ID: James Pugh, male    DOB: 07-15-1946, 68 y.o.   MRN: [id number]  HPI Patient is seen for Medicare wellness exam and medical followup. Has chronic problems include history of prediabetes, hyperlipidemia, and BPH. He has some slow stream occasionally at night and some nocturia but no recent progressive symptoms. He thinks he had shingles vaccine but cannot confirm. He had previous pneumonia vaccine about 5 years ago. Flu vaccine up-to-date. Tetanus up-to-date. Colonoscopy up to date.  He takes only aspirin 81 mg daily but no other medications.  Past Medical History  Diagnosis Date  . HYPERLIPIDEMIA 06/06/2009  . CERUMEN IMPACTION 08/22/2009  . PREDIABETES 07/13/2010   Past Surgical History  Procedure Laterality Date  . Tonsillectomy      reports that he quit smoking about 30 years ago. His smoking use included Cigarettes. He has a 20 pack-year smoking history. He does not have any smokeless tobacco history on file. His alcohol and drug histories are not on file. family history includes Alcohol abuse in his other; Diabetes in his father; Heart disease in his father; Stroke in his father. No Known Allergies  1.  Risk factors based on Past Medical , Social, and Family history reviewed and as above. 2.  Limitations in physical activities no recent fall. 3.  Depression/mood no depression or mood disorder 4.  Hearing he's had some hearing deficits in the past and has been followed by audiology 5.  ADLs independent in all 6.  Cognitive function (orientation to time and place, language, writing, speech,memory) language and judgment intact. Memory intact 7.  Home Safety no issues 8.  Height, weight, and visual acuity. All stable 9.  Counseling discussed regular exercise. 10. Recommendation of preventive services. Confirm prior shingles. Check on coverage for Prevnar 13 11. Labs based on risk factors PSA, lipid, basic metabolic panel 12. Care Plan as  above    Review of Systems  Constitutional: Negative for fever, activity change, appetite change and fatigue.  HENT: Negative for congestion, ear pain and trouble swallowing.   Eyes: Negative for pain and visual disturbance.  Respiratory: Negative for cough, shortness of breath and wheezing.   Cardiovascular: Negative for chest pain and palpitations.  Gastrointestinal: Negative for nausea, vomiting, abdominal pain, diarrhea, constipation, blood in stool, abdominal distention and rectal pain.  Genitourinary: Negative for dysuria, hematuria and testicular pain.  Musculoskeletal: Negative for arthralgias and joint swelling.  Skin: Negative for rash.  Neurological: Negative for dizziness, syncope and headaches.  Hematological: Negative for adenopathy.  Psychiatric/Behavioral: Negative for confusion and dysphoric mood.       Objective:   Physical Exam  Constitutional: He is oriented to person, place, and time. He appears well-developed and well-nourished. No distress.  HENT:  Head: Normocephalic and atraumatic.  Right Ear: External ear normal.  Left Ear: External ear normal.  Mouth/Throat: Oropharynx is clear and moist.  Eyes: Conjunctivae and EOM are normal. Pupils are equal, round, and reactive to light.  Neck: Normal range of motion. Neck supple. No thyromegaly present.  Cardiovascular: Normal rate, regular rhythm and normal heart sounds.   No murmur heard. Pulmonary/Chest: No respiratory distress. He has no wheezes. He has no rales.  Abdominal: Soft. Bowel sounds are normal. He exhibits no distension and no mass. There is no tenderness. There is no rebound and no guarding.  Genitourinary: Rectum normal and prostate normal.  Prostate is enlarged but no nodules and no asymmetry and no masses  Musculoskeletal: He exhibits  no edema.  Lymphadenopathy:    He has no cervical adenopathy.  Neurological: He is alert and oriented to person, place, and time. He displays normal reflexes. No  cranial nerve deficit.  Skin: No rash noted.  Psychiatric: He has a normal mood and affect.          Assessment & Plan:  #1 complete physical. Confirm prior shingles. Check on coverage for Prevnar 13. Colonoscopy and other preventative health up-to-date #2 prediabetes history. Weight control measures discussed. Check basic metabolic panel fasting #3 history of hyperlipidemia. Check lipid and hepatic panel #4 history of BPH. Symptomatically stable. Check PSA per patient request after discussion of risks and benefits

## 2013-10-16 NOTE — Patient Instructions (Signed)
Check on coverage for Prevnar 13- this is the new pneumonia vaccine Check on whether you have had prior Shingles vaccine.

## 2013-10-17 ENCOUNTER — Encounter: Payer: Self-pay | Admitting: Family Medicine

## 2013-10-19 ENCOUNTER — Ambulatory Visit: Payer: Medicare Other | Admitting: Family Medicine

## 2013-10-23 DIAGNOSIS — L819 Disorder of pigmentation, unspecified: Secondary | ICD-10-CM | POA: Diagnosis not present

## 2013-10-23 DIAGNOSIS — L570 Actinic keratosis: Secondary | ICD-10-CM | POA: Diagnosis not present

## 2013-10-23 DIAGNOSIS — L821 Other seborrheic keratosis: Secondary | ICD-10-CM | POA: Diagnosis not present

## 2013-10-23 DIAGNOSIS — Z85828 Personal history of other malignant neoplasm of skin: Secondary | ICD-10-CM | POA: Diagnosis not present

## 2013-10-23 DIAGNOSIS — D1801 Hemangioma of skin and subcutaneous tissue: Secondary | ICD-10-CM | POA: Diagnosis not present

## 2013-10-24 ENCOUNTER — Ambulatory Visit (INDEPENDENT_AMBULATORY_CARE_PROVIDER_SITE_OTHER): Payer: Medicare Other | Admitting: Family Medicine

## 2013-10-24 DIAGNOSIS — Z23 Encounter for immunization: Secondary | ICD-10-CM | POA: Diagnosis not present

## 2013-12-27 DIAGNOSIS — H5200 Hypermetropia, unspecified eye: Secondary | ICD-10-CM | POA: Diagnosis not present

## 2013-12-27 DIAGNOSIS — H53009 Unspecified amblyopia, unspecified eye: Secondary | ICD-10-CM | POA: Diagnosis not present

## 2013-12-27 DIAGNOSIS — H25019 Cortical age-related cataract, unspecified eye: Secondary | ICD-10-CM | POA: Diagnosis not present

## 2014-04-23 DIAGNOSIS — L570 Actinic keratosis: Secondary | ICD-10-CM | POA: Diagnosis not present

## 2014-04-23 DIAGNOSIS — Z85828 Personal history of other malignant neoplasm of skin: Secondary | ICD-10-CM | POA: Diagnosis not present

## 2014-04-23 DIAGNOSIS — L819 Disorder of pigmentation, unspecified: Secondary | ICD-10-CM | POA: Diagnosis not present

## 2014-04-23 DIAGNOSIS — D1739 Benign lipomatous neoplasm of skin and subcutaneous tissue of other sites: Secondary | ICD-10-CM | POA: Diagnosis not present

## 2014-04-23 DIAGNOSIS — L408 Other psoriasis: Secondary | ICD-10-CM | POA: Diagnosis not present

## 2014-04-23 DIAGNOSIS — D1801 Hemangioma of skin and subcutaneous tissue: Secondary | ICD-10-CM | POA: Diagnosis not present

## 2014-05-13 DIAGNOSIS — L408 Other psoriasis: Secondary | ICD-10-CM | POA: Diagnosis not present

## 2014-05-13 DIAGNOSIS — Z85828 Personal history of other malignant neoplasm of skin: Secondary | ICD-10-CM | POA: Diagnosis not present

## 2014-06-10 ENCOUNTER — Other Ambulatory Visit: Payer: Self-pay | Admitting: Dermatology

## 2014-06-10 DIAGNOSIS — D074 Carcinoma in situ of penis: Secondary | ICD-10-CM | POA: Diagnosis not present

## 2014-06-10 DIAGNOSIS — Z85828 Personal history of other malignant neoplasm of skin: Secondary | ICD-10-CM | POA: Diagnosis not present

## 2014-06-10 DIAGNOSIS — D048 Carcinoma in situ of skin of other sites: Secondary | ICD-10-CM | POA: Diagnosis not present

## 2014-06-10 DIAGNOSIS — R21 Rash and other nonspecific skin eruption: Secondary | ICD-10-CM | POA: Diagnosis not present

## 2014-07-02 DIAGNOSIS — M25551 Pain in right hip: Secondary | ICD-10-CM | POA: Diagnosis not present

## 2014-07-02 DIAGNOSIS — M7061 Trochanteric bursitis, right hip: Secondary | ICD-10-CM | POA: Diagnosis not present

## 2014-07-02 DIAGNOSIS — M5136 Other intervertebral disc degeneration, lumbar region: Secondary | ICD-10-CM | POA: Diagnosis not present

## 2014-07-02 DIAGNOSIS — M5431 Sciatica, right side: Secondary | ICD-10-CM | POA: Diagnosis not present

## 2014-07-08 DIAGNOSIS — Z85828 Personal history of other malignant neoplasm of skin: Secondary | ICD-10-CM | POA: Diagnosis not present

## 2014-07-08 DIAGNOSIS — L570 Actinic keratosis: Secondary | ICD-10-CM | POA: Diagnosis not present

## 2014-07-08 DIAGNOSIS — D045 Carcinoma in situ of skin of trunk: Secondary | ICD-10-CM | POA: Diagnosis not present

## 2014-07-20 DIAGNOSIS — L03031 Cellulitis of right toe: Secondary | ICD-10-CM | POA: Diagnosis not present

## 2014-08-01 DIAGNOSIS — M25551 Pain in right hip: Secondary | ICD-10-CM | POA: Diagnosis not present

## 2014-08-01 DIAGNOSIS — M5431 Sciatica, right side: Secondary | ICD-10-CM | POA: Diagnosis not present

## 2014-08-01 DIAGNOSIS — M7061 Trochanteric bursitis, right hip: Secondary | ICD-10-CM | POA: Diagnosis not present

## 2014-08-12 DIAGNOSIS — Z85828 Personal history of other malignant neoplasm of skin: Secondary | ICD-10-CM | POA: Diagnosis not present

## 2014-08-12 DIAGNOSIS — D045 Carcinoma in situ of skin of trunk: Secondary | ICD-10-CM | POA: Diagnosis not present

## 2014-09-23 DIAGNOSIS — Z85828 Personal history of other malignant neoplasm of skin: Secondary | ICD-10-CM | POA: Diagnosis not present

## 2014-09-23 DIAGNOSIS — D045 Carcinoma in situ of skin of trunk: Secondary | ICD-10-CM | POA: Diagnosis not present

## 2014-11-04 ENCOUNTER — Other Ambulatory Visit (INDEPENDENT_AMBULATORY_CARE_PROVIDER_SITE_OTHER): Payer: Medicare Other

## 2014-11-04 DIAGNOSIS — N400 Enlarged prostate without lower urinary tract symptoms: Secondary | ICD-10-CM

## 2014-11-04 DIAGNOSIS — E039 Hypothyroidism, unspecified: Secondary | ICD-10-CM | POA: Diagnosis not present

## 2014-11-04 DIAGNOSIS — D649 Anemia, unspecified: Secondary | ICD-10-CM

## 2014-11-04 DIAGNOSIS — I10 Essential (primary) hypertension: Secondary | ICD-10-CM | POA: Diagnosis not present

## 2014-11-04 DIAGNOSIS — E785 Hyperlipidemia, unspecified: Secondary | ICD-10-CM

## 2014-11-04 LAB — POCT URINALYSIS DIP (MANUAL ENTRY)
Bilirubin, UA: NEGATIVE
Glucose, UA: NEGATIVE
Ketones, POC UA: NEGATIVE
NITRITE UA: NEGATIVE
Protein Ur, POC: NEGATIVE
RBC UA: NEGATIVE
Urobilinogen, UA: 0.2
pH, UA: 5.5

## 2014-11-04 LAB — TSH: TSH: 0.36 u[IU]/mL (ref 0.35–4.50)

## 2014-11-04 LAB — CBC WITH DIFFERENTIAL/PLATELET
BASOS ABS: 0 10*3/uL (ref 0.0–0.1)
Basophils Relative: 0.7 % (ref 0.0–3.0)
EOS ABS: 0.2 10*3/uL (ref 0.0–0.7)
Eosinophils Relative: 3.2 % (ref 0.0–5.0)
HCT: 41.9 % (ref 39.0–52.0)
HEMOGLOBIN: 14.1 g/dL (ref 13.0–17.0)
Lymphocytes Relative: 26.9 % (ref 12.0–46.0)
Lymphs Abs: 1.8 10*3/uL (ref 0.7–4.0)
MCHC: 33.6 g/dL (ref 30.0–36.0)
MCV: 87.5 fl (ref 78.0–100.0)
Monocytes Absolute: 0.6 10*3/uL (ref 0.1–1.0)
Monocytes Relative: 9 % (ref 3.0–12.0)
Neutro Abs: 4 10*3/uL (ref 1.4–7.7)
Neutrophils Relative %: 60.2 % (ref 43.0–77.0)
PLATELETS: 182 10*3/uL (ref 150.0–400.0)
RBC: 4.79 Mil/uL (ref 4.22–5.81)
RDW: 12.9 % (ref 11.5–15.5)
WBC: 6.6 10*3/uL (ref 4.0–10.5)

## 2014-11-04 LAB — BASIC METABOLIC PANEL
BUN: 9 mg/dL (ref 6–23)
CHLORIDE: 105 meq/L (ref 96–112)
CO2: 28 mEq/L (ref 19–32)
Calcium: 9.5 mg/dL (ref 8.4–10.5)
Creatinine, Ser: 0.83 mg/dL (ref 0.40–1.50)
GFR: 97.23 mL/min (ref >60.00)
GLUCOSE: 108 mg/dL — AB (ref 70–99)
POTASSIUM: 5.1 meq/L (ref 3.5–5.1)
SODIUM: 138 meq/L (ref 135–145)

## 2014-11-04 LAB — LIPID PANEL
Cholesterol: 199 mg/dL (ref 0–200)
HDL: 30.5 mg/dL — ABNORMAL LOW (ref >39.00)
NonHDL: 168.5
Total CHOL/HDL Ratio: 7
Triglycerides: 215 mg/dL — ABNORMAL HIGH (ref 0.0–149.0)
VLDL: 43 mg/dL — ABNORMAL HIGH (ref 0.0–40.0)

## 2014-11-04 LAB — HEPATIC FUNCTION PANEL
ALT: 14 U/L (ref 0–53)
AST: 15 U/L (ref 0–37)
Albumin: 3.8 g/dL (ref 3.5–5.2)
Alkaline Phosphatase: 34 U/L — ABNORMAL LOW (ref 39–117)
BILIRUBIN DIRECT: 0.1 mg/dL (ref 0.0–0.3)
TOTAL PROTEIN: 6.4 g/dL (ref 6.0–8.3)
Total Bilirubin: 0.6 mg/dL (ref 0.2–1.2)

## 2014-11-04 LAB — LDL CHOLESTEROL, DIRECT: LDL DIRECT: 121 mg/dL

## 2014-11-04 LAB — PSA: PSA: 0.8 ng/mL (ref 0.10–4.00)

## 2014-11-11 ENCOUNTER — Ambulatory Visit (INDEPENDENT_AMBULATORY_CARE_PROVIDER_SITE_OTHER): Payer: Medicare Other | Admitting: Family Medicine

## 2014-11-11 ENCOUNTER — Encounter: Payer: Self-pay | Admitting: Family Medicine

## 2014-11-11 ENCOUNTER — Ambulatory Visit (INDEPENDENT_AMBULATORY_CARE_PROVIDER_SITE_OTHER)
Admission: RE | Admit: 2014-11-11 | Discharge: 2014-11-11 | Disposition: A | Discharge status: Home / Self Care | Payer: Medicare Other | Source: Ambulatory Visit | Attending: Family Medicine | Admitting: Family Medicine

## 2014-11-11 VITALS — BP 126/66 | HR 74 | Temp 98.4°F | Ht 66.0 in | Wt 166.0 lb

## 2014-11-11 DIAGNOSIS — R05 Cough: Secondary | ICD-10-CM | POA: Diagnosis not present

## 2014-11-11 DIAGNOSIS — Z23 Encounter for immunization: Secondary | ICD-10-CM

## 2014-11-11 DIAGNOSIS — R053 Chronic cough: Secondary | ICD-10-CM

## 2014-11-11 DIAGNOSIS — Z0000 Encounter for general adult medical examination without abnormal findings: Secondary | ICD-10-CM | POA: Diagnosis not present

## 2014-11-11 MED ORDER — HYDROCODONE-HOMATROPINE 5-1.5 MG/5ML PO SYRP: 5.0000 mL | ORAL_SOLUTION | Freq: Four times a day (QID) | ORAL | Status: AC | PRN

## 2014-11-11 NOTE — Patient Instructions (Signed)
Consider yearly flu vaccine.

## 2014-11-11 NOTE — Progress Notes (Addendum)
Subjective:    Patient ID: James Pugh, male    DOB: 1946/02/11, 69 y.o.   MRN: [id number]  HPI Patient is here for following issues  Medicare wellness exam. He has history of dyslipidemia and prediabetes. Currently takes no medication other than baby aspirin 1 daily. Colonoscopy up-to-date. He did not receive flu vaccine this year. He is due for repeat tetanus. Other vaccines up-to-date. Quit smoking 1984 after about a 20 pack year history.  He has acute issue of over 2 month history of cough. Started around Christmas. Wife had similar symptoms. His cough is dry. It is not associated dyspnea, fever, chills, GERD, postnasal drip symptoms, or any wheezing. No hemoptysis. No pleuritic pain.  Past Medical History  Diagnosis Date  . HYPERLIPIDEMIA 06/06/2009  . CERUMEN IMPACTION 08/22/2009  . PREDIABETES 07/13/2010   Past Surgical History  Procedure Laterality Date  . Tonsillectomy      reports that he quit smoking about 31 years ago. His smoking use included Cigarettes. He has a 20 pack-year smoking history. He does not have any smokeless tobacco history on file. His alcohol and drug histories are not on file. family history includes Alcohol abuse in his other; Diabetes in his father; Heart disease in his father; Stroke in his father. No Known Allergies   1.  Risk factors based on Past Medical , Social, and Family history reviewed and as indicated above with no changes 2.  Limitations in physical activities None.  No recent falls. 3.  Depression/mood No active depression or anxiety issues 4.  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  ADLs independent in all. 6.  Cognitive function (orientation to time and place, language, writing, speech,memory) no short or long term memory issues.  Language and judgement intact. 7.  Home Safety no issues 8.  Height, weight, and visual acuity.all stable. 9.  Counseling discussed flu vaccine annually and he declines for this year. 10.  Recommendation of preventive services. Tdap given.   11. Labs based on risk factors lipid, BMP, Hepatic 12. Care Plan as above. 13. Other Providers none regularly. 14. Written schedule of screening/prevention services given to patient.     Review of Systems  Constitutional: Negative for fever, chills, activity change, appetite change, fatigue and unexpected weight change.  HENT: Negative for congestion, ear pain, sore throat and trouble swallowing.   Respiratory: Positive for cough. Negative for shortness of breath, wheezing and stridor.   Cardiovascular: Negative for chest pain and leg swelling.  Gastrointestinal: Negative for abdominal pain.  Endocrine: Negative for polydipsia and polyuria.  Musculoskeletal: Negative for arthralgias.  Skin: Negative for rash.  Neurological: Negative for syncope and headaches.  Hematological: Negative for adenopathy.       Objective:   Physical Exam  Constitutional: He is oriented to person, place, and time. He appears well-developed and well-nourished. No distress.  HENT:  Head: Normocephalic and atraumatic.  Right Ear: External ear normal.  Left Ear: External ear normal.  Mouth/Throat: Oropharynx is clear and moist.  Eyes: Conjunctivae and EOM are normal. Pupils are equal, round, and reactive to light.  Neck: Normal range of motion. Neck supple. No thyromegaly present.  Cardiovascular: Normal rate, regular rhythm and normal heart sounds.   No murmur heard. Pulmonary/Chest: No respiratory distress. He has no wheezes. He has no rales.  Abdominal: Soft. Bowel sounds are normal. He exhibits no distension and no mass. There is no tenderness. There is no rebound and no guarding.  Musculoskeletal: He exhibits no  edema.  Lymphadenopathy:    He has no cervical adenopathy.  Neurological: He is alert and oriented to person, place, and time. He displays normal reflexes. No cranial nerve deficit.  Skin: No rash noted.  Psychiatric: He has a normal  mood and affect.          Assessment & Plan:  #1 complete physical. Labs reviewed. He has dyslipidemia and glucose in the prediabetes range with glucose 108. We discussed measures to reduce development of full blown type 2 diabetes. Overall, his cholesterol is improved compared to last year.  We discussed importance of yearly flu vaccine. He declines for this year. Tetanus booster given #2 chronic cough of now over 2 months duration. He does not have any red flags such as appetite or weight changes or hemoptysis. He does not have any obvious GERD, wheezing, or postnasal drip symptoms. Obtain chest x-ray. Briefly discussed low-dose CT scanning for lung cancer.  Hycodan cough syrup 1 teaspoon daily at bedtime for severe cough

## 2014-11-11 NOTE — Progress Notes (Signed)
Pre visit review using our clinic review tool, if applicable. No additional management support is needed unless otherwise documented below in the visit note. 

## 2014-11-24 NOTE — Addendum Note (Signed)
Addended by: [name] on: 11/24/2014 08:29 PM   Modules accepted: Level of Service

## 2014-11-28 DIAGNOSIS — Z85828 Personal history of other malignant neoplasm of skin: Secondary | ICD-10-CM | POA: Diagnosis not present

## 2015-01-03 DIAGNOSIS — H2513 Age-related nuclear cataract, bilateral: Secondary | ICD-10-CM | POA: Diagnosis not present

## 2015-01-03 DIAGNOSIS — H25012 Cortical age-related cataract, left eye: Secondary | ICD-10-CM | POA: Diagnosis not present

## 2015-01-21 DIAGNOSIS — Z85828 Personal history of other malignant neoplasm of skin: Secondary | ICD-10-CM | POA: Diagnosis not present

## 2015-01-21 DIAGNOSIS — D049 Carcinoma in situ of skin, unspecified: Secondary | ICD-10-CM | POA: Diagnosis not present

## 2015-02-26 ENCOUNTER — Encounter: Payer: Self-pay | Admitting: Family Medicine

## 2015-02-26 ENCOUNTER — Ambulatory Visit (INDEPENDENT_AMBULATORY_CARE_PROVIDER_SITE_OTHER): Payer: Medicare Other | Admitting: Family Medicine

## 2015-02-26 VITALS — BP 130/74 | HR 62 | Temp 97.4°F | Wt 162.0 lb

## 2015-02-26 DIAGNOSIS — R634 Abnormal weight loss: Secondary | ICD-10-CM

## 2015-02-26 DIAGNOSIS — R432 Parageusia: Secondary | ICD-10-CM | POA: Diagnosis not present

## 2015-02-26 LAB — VITAMIN B12: Vitamin B-12: 247 pg/mL (ref 211–911)

## 2015-02-26 LAB — CBC WITH DIFFERENTIAL/PLATELET
BASOS ABS: 0 10*3/uL (ref 0.0–0.1)
Basophils Relative: 0.5 % (ref 0.0–3.0)
EOS ABS: 0.1 10*3/uL (ref 0.0–0.7)
Eosinophils Relative: 1.8 % (ref 0.0–5.0)
HEMATOCRIT: 38.8 % — AB (ref 39.0–52.0)
HEMOGLOBIN: 12.8 g/dL — AB (ref 13.0–17.0)
LYMPHS ABS: 1.5 10*3/uL (ref 0.7–4.0)
Lymphocytes Relative: 42.1 % (ref 12.0–46.0)
MCHC: 33.1 g/dL (ref 30.0–36.0)
MCV: 87.8 fl (ref 78.0–100.0)
MONO ABS: 0.6 10*3/uL (ref 0.1–1.0)
Monocytes Relative: 15.9 % — ABNORMAL HIGH (ref 3.0–12.0)
NEUTROS ABS: 1.5 10*3/uL (ref 1.4–7.7)
Neutrophils Relative %: 39.7 % — ABNORMAL LOW (ref 43.0–77.0)
Platelets: 205 10*3/uL (ref 150.0–400.0)
RBC: 4.42 Mil/uL (ref 4.22–5.81)
RDW: 14.6 % (ref 11.5–15.5)
WBC: 3.7 10*3/uL — ABNORMAL LOW (ref 4.0–10.5)

## 2015-02-26 LAB — COMPREHENSIVE METABOLIC PANEL
ALT: 13 U/L (ref 0–53)
AST: 16 U/L (ref 0–37)
Albumin: 3.6 g/dL (ref 3.5–5.2)
Alkaline Phosphatase: 32 U/L — ABNORMAL LOW (ref 39–117)
BUN: 10 mg/dL (ref 6–23)
CALCIUM: 9.5 mg/dL (ref 8.4–10.5)
CHLORIDE: 103 meq/L (ref 96–112)
CO2: 29 mEq/L (ref 19–32)
CREATININE: 0.89 mg/dL (ref 0.40–1.50)
GFR: 89.63 mL/min (ref >60.00)
Glucose, Bld: 89 mg/dL (ref 70–99)
Potassium: 3.9 mEq/L (ref 3.5–5.1)
Sodium: 137 mEq/L (ref 135–145)
Total Bilirubin: 0.3 mg/dL (ref 0.2–1.2)
Total Protein: 6.3 g/dL (ref 6.0–8.3)

## 2015-02-26 NOTE — Progress Notes (Signed)
   Subjective:    Patient ID: James Pugh, male    DOB: 04-18-1946, 69 y.o.   MRN: [id number]  HPI Patient seen with several week history of loss of taste. This came on relatively acutely. He's had some diminished appetite and he states about 8 pounds weight loss, though we have recorded about 4 pounds weight loss since last visit last winter. He has not had any headaches. No recent facial weakness to suggest Bell's palsy. No history of B12 deficiency. Denies any dry eyes or dry mouth symptoms. No risk factors for lead toxicity or copper toxicity.  He also relates some intermittent bright blood per stools. He has appointment pending with GI. He had colonoscopy 2010. He denies any focal abdominal pain. No chest pains. No difficulty swallowing. No melena.  Past Medical History  Diagnosis Date  . HYPERLIPIDEMIA 06/06/2009  . CERUMEN IMPACTION 08/22/2009  . PREDIABETES 07/13/2010   Past Surgical History  Procedure Laterality Date  . Tonsillectomy      reports that he quit smoking about 31 years ago. His smoking use included Cigarettes. He has a 20 pack-year smoking history. He does not have any smokeless tobacco history on file. His alcohol and drug histories are not on file. family history includes Alcohol abuse in his other; Diabetes in his father; Heart disease in his father; Stroke in his father. No Known Allergies    Review of Systems  Constitutional: Positive for appetite change and unexpected weight change. Negative for fever and chills.  HENT: Negative for trouble swallowing.   Respiratory: Negative for cough and shortness of breath.   Cardiovascular: Negative for chest pain.  Gastrointestinal: Positive for blood in stool. Negative for nausea, vomiting and abdominal pain.  Genitourinary: Negative for dysuria.  Neurological: Negative for dizziness.  Hematological: Negative for adenopathy.       Objective:   Physical Exam  Constitutional: He appears well-developed and  well-nourished.  HENT:  Right Ear: External ear normal.  Left Ear: External ear normal.  Mouth/Throat: Oropharynx is clear and moist.  Neck: Neck supple. No thyromegaly present.  Cardiovascular: Normal rate and regular rhythm.   Pulmonary/Chest: Effort normal and breath sounds normal. No respiratory distress. He has no wheezes. He has no rales.  Abdominal: Soft. Bowel sounds are normal. He exhibits no distension and no mass. There is no tenderness. There is no rebound and no guarding.  Lymphadenopathy:    He has no cervical adenopathy.  Skin: No rash noted.          Assessment & Plan:  #1 reported relatively acute loss of taste. He does not have associated loss of smell. No evidence for Bell's palsy. No specific risk factors for B12 deficiency other than age. Exam nonfocal. Will check further labs with chemistries, CBC, B12, zinc level #2 loss of weight. Relatively mild. Check labs as above. Recent TSH normal. He has pending follow-up with GI already scheduled to evaluate occasional blood per stool

## 2015-02-26 NOTE — Progress Notes (Signed)
Pre visit review using our clinic review tool, if applicable. No additional management support is needed unless otherwise documented below in the visit note. 

## 2015-03-01 LAB — ZINC: Zinc: 62 ug/dL (ref 60–130)

## 2015-03-26 DIAGNOSIS — K625 Hemorrhage of anus and rectum: Secondary | ICD-10-CM | POA: Diagnosis not present

## 2015-03-26 DIAGNOSIS — K591 Functional diarrhea: Secondary | ICD-10-CM | POA: Diagnosis not present

## 2015-03-28 DIAGNOSIS — D045 Carcinoma in situ of skin of trunk: Secondary | ICD-10-CM | POA: Diagnosis not present

## 2015-03-28 DIAGNOSIS — D074 Carcinoma in situ of penis: Secondary | ICD-10-CM | POA: Diagnosis not present

## 2015-03-28 DIAGNOSIS — Z85828 Personal history of other malignant neoplasm of skin: Secondary | ICD-10-CM | POA: Diagnosis not present

## 2015-03-28 DIAGNOSIS — D485 Neoplasm of uncertain behavior of skin: Secondary | ICD-10-CM | POA: Diagnosis not present

## 2015-04-08 ENCOUNTER — Other Ambulatory Visit: Payer: Self-pay | Admitting: Gastroenterology

## 2015-04-08 DIAGNOSIS — K625 Hemorrhage of anus and rectum: Secondary | ICD-10-CM | POA: Diagnosis not present

## 2015-04-08 DIAGNOSIS — K6289 Other specified diseases of anus and rectum: Secondary | ICD-10-CM | POA: Diagnosis not present

## 2015-04-08 DIAGNOSIS — K529 Noninfective gastroenteritis and colitis, unspecified: Secondary | ICD-10-CM | POA: Diagnosis not present

## 2015-05-09 DIAGNOSIS — K625 Hemorrhage of anus and rectum: Secondary | ICD-10-CM | POA: Diagnosis not present

## 2015-05-26 DIAGNOSIS — Z85828 Personal history of other malignant neoplasm of skin: Secondary | ICD-10-CM | POA: Diagnosis not present

## 2015-05-26 DIAGNOSIS — D048 Carcinoma in situ of skin of other sites: Secondary | ICD-10-CM | POA: Diagnosis not present

## 2015-06-25 DIAGNOSIS — K625 Hemorrhage of anus and rectum: Secondary | ICD-10-CM | POA: Diagnosis not present

## 2015-07-28 DIAGNOSIS — Z85828 Personal history of other malignant neoplasm of skin: Secondary | ICD-10-CM | POA: Diagnosis not present

## 2015-07-28 DIAGNOSIS — L570 Actinic keratosis: Secondary | ICD-10-CM | POA: Diagnosis not present

## 2015-07-28 DIAGNOSIS — K130 Diseases of lips: Secondary | ICD-10-CM | POA: Diagnosis not present

## 2015-09-25 DIAGNOSIS — Z85828 Personal history of other malignant neoplasm of skin: Secondary | ICD-10-CM | POA: Diagnosis not present

## 2015-11-17 ENCOUNTER — Ambulatory Visit (INDEPENDENT_AMBULATORY_CARE_PROVIDER_SITE_OTHER): Payer: Medicare Other | Admitting: Family Medicine

## 2015-11-17 VITALS — BP 128/80 | HR 97 | Temp 98.9°F | Ht 66.0 in | Wt 168.0 lb

## 2015-11-17 DIAGNOSIS — J209 Acute bronchitis, unspecified: Secondary | ICD-10-CM

## 2015-11-17 MED ORDER — HYDROCOD POLST-CPM POLST ER 10-8 MG/5ML PO SUER: 5.0000 mL | Freq: Two times a day (BID) | ORAL | Status: DC | PRN

## 2015-11-17 NOTE — Progress Notes (Signed)
   Subjective:    Patient ID: James Pugh, male    DOB: June 11, 1946, 70 y.o.   MRN: [id number]  HPI Acute visit Onset last Friday of sore throat, nasal congestion, cough. No fevers or chills. Cough has been severe at times. Mostly nonproductive. No dyspnea or wheezing. Wife with similar symptoms. Mucinex DM without much relief. Denies any associated headaches, nausea, vomiting, or diarrhea. No known sick contacts.  Past Medical History  Diagnosis Date  . HYPERLIPIDEMIA 06/06/2009  . CERUMEN IMPACTION 08/22/2009  . PREDIABETES 07/13/2010   Past Surgical History  Procedure Laterality Date  . Tonsillectomy      reports that he quit smoking about 32 years ago. His smoking use included Cigarettes. He has a 20 pack-year smoking history. He does not have any smokeless tobacco history on file. His alcohol and drug histories are not on file. family history includes Alcohol abuse in his other; Diabetes in his father; Heart disease in his father; Stroke in his father. No Known Allergies      Review of Systems  Constitutional: Positive for fatigue. Negative for fever and chills.  HENT: Positive for congestion and sore throat.   Respiratory: Positive for cough.   Gastrointestinal: Negative for nausea and vomiting.       Objective:   Physical Exam  Constitutional: He appears well-developed and well-nourished.  HENT:  Right Ear: External ear normal.  Left Ear: External ear normal.  Mouth/Throat: Oropharynx is clear and moist.  Neck: Neck supple.  Cardiovascular: Normal rate and regular rhythm.   Pulmonary/Chest: Effort normal and breath sounds normal. No respiratory distress. He has no wheezes. He has no rales. He exhibits no tenderness.  Lymphadenopathy:    He has no cervical adenopathy.          Assessment & Plan:  Viral syndrome with cough. Nonfocal exam. Patient requesting cough suppressant. Tussionex 1 teaspoon daily at bedtime when necessary severe cough. Follow-up  for fever or worsening symptoms.

## 2015-11-17 NOTE — Patient Instructions (Signed)

## 2015-11-17 NOTE — Progress Notes (Signed)
Pre visit review using our clinic review tool, if applicable. No additional management support is needed unless otherwise documented below in the visit note. 

## 2016-01-01 DIAGNOSIS — C44319 Basal cell carcinoma of skin of other parts of face: Secondary | ICD-10-CM | POA: Diagnosis not present

## 2016-01-01 DIAGNOSIS — D1801 Hemangioma of skin and subcutaneous tissue: Secondary | ICD-10-CM | POA: Diagnosis not present

## 2016-01-01 DIAGNOSIS — D485 Neoplasm of uncertain behavior of skin: Secondary | ICD-10-CM | POA: Diagnosis not present

## 2016-01-01 DIAGNOSIS — Z85828 Personal history of other malignant neoplasm of skin: Secondary | ICD-10-CM | POA: Diagnosis not present

## 2016-01-01 DIAGNOSIS — L578 Other skin changes due to chronic exposure to nonionizing radiation: Secondary | ICD-10-CM | POA: Diagnosis not present

## 2016-01-01 DIAGNOSIS — D1721 Benign lipomatous neoplasm of skin and subcutaneous tissue of right arm: Secondary | ICD-10-CM | POA: Diagnosis not present

## 2016-01-01 DIAGNOSIS — L821 Other seborrheic keratosis: Secondary | ICD-10-CM | POA: Diagnosis not present

## 2016-01-01 DIAGNOSIS — D074 Carcinoma in situ of penis: Secondary | ICD-10-CM | POA: Diagnosis not present

## 2016-01-01 DIAGNOSIS — D1722 Benign lipomatous neoplasm of skin and subcutaneous tissue of left arm: Secondary | ICD-10-CM | POA: Diagnosis not present

## 2016-01-08 DIAGNOSIS — Z0100 Encounter for examination of eyes and vision without abnormal findings: Secondary | ICD-10-CM | POA: Diagnosis not present

## 2016-01-08 DIAGNOSIS — H2513 Age-related nuclear cataract, bilateral: Secondary | ICD-10-CM | POA: Diagnosis not present

## 2016-01-08 DIAGNOSIS — H53001 Unspecified amblyopia, right eye: Secondary | ICD-10-CM | POA: Diagnosis not present

## 2016-01-21 ENCOUNTER — Encounter: Payer: Self-pay | Admitting: Family Medicine

## 2016-01-28 DIAGNOSIS — C44319 Basal cell carcinoma of skin of other parts of face: Secondary | ICD-10-CM | POA: Diagnosis not present

## 2016-01-28 DIAGNOSIS — Z85828 Personal history of other malignant neoplasm of skin: Secondary | ICD-10-CM | POA: Diagnosis not present

## 2016-02-24 DIAGNOSIS — Z85828 Personal history of other malignant neoplasm of skin: Secondary | ICD-10-CM | POA: Diagnosis not present

## 2016-04-22 DIAGNOSIS — M7061 Trochanteric bursitis, right hip: Secondary | ICD-10-CM | POA: Diagnosis not present

## 2016-04-22 DIAGNOSIS — M5116 Intervertebral disc disorders with radiculopathy, lumbar region: Secondary | ICD-10-CM | POA: Diagnosis not present

## 2016-05-06 ENCOUNTER — Other Ambulatory Visit: Payer: Self-pay | Admitting: Orthopedic Surgery

## 2016-05-06 DIAGNOSIS — M545 Low back pain: Secondary | ICD-10-CM

## 2016-05-06 DIAGNOSIS — M5441 Lumbago with sciatica, right side: Secondary | ICD-10-CM | POA: Diagnosis not present

## 2016-05-06 DIAGNOSIS — M7061 Trochanteric bursitis, right hip: Secondary | ICD-10-CM | POA: Diagnosis not present

## 2016-05-07 ENCOUNTER — Ambulatory Visit
Admission: RE | Admit: 2016-05-07 | Discharge: 2016-05-07 | Disposition: A | Discharge status: Home / Self Care | Payer: Medicare Other | Source: Ambulatory Visit | Attending: Orthopedic Surgery | Admitting: Orthopedic Surgery

## 2016-05-07 DIAGNOSIS — M5126 Other intervertebral disc displacement, lumbar region: Secondary | ICD-10-CM | POA: Diagnosis not present

## 2016-05-07 DIAGNOSIS — M545 Low back pain: Secondary | ICD-10-CM

## 2016-05-19 DIAGNOSIS — M1611 Unilateral primary osteoarthritis, right hip: Secondary | ICD-10-CM | POA: Diagnosis not present

## 2016-05-19 DIAGNOSIS — M25551 Pain in right hip: Secondary | ICD-10-CM | POA: Diagnosis not present

## 2016-05-25 DIAGNOSIS — M5441 Lumbago with sciatica, right side: Secondary | ICD-10-CM | POA: Diagnosis not present

## 2016-05-28 DIAGNOSIS — M5441 Lumbago with sciatica, right side: Secondary | ICD-10-CM | POA: Diagnosis not present

## 2016-05-31 DIAGNOSIS — M5116 Intervertebral disc disorders with radiculopathy, lumbar region: Secondary | ICD-10-CM | POA: Diagnosis not present

## 2016-05-31 DIAGNOSIS — M5441 Lumbago with sciatica, right side: Secondary | ICD-10-CM | POA: Diagnosis not present

## 2016-05-31 DIAGNOSIS — M5136 Other intervertebral disc degeneration, lumbar region: Secondary | ICD-10-CM | POA: Diagnosis not present

## 2016-06-08 ENCOUNTER — Encounter: Payer: Self-pay | Admitting: Family Medicine

## 2016-06-08 DIAGNOSIS — M5136 Other intervertebral disc degeneration, lumbar region: Secondary | ICD-10-CM | POA: Diagnosis not present

## 2016-06-28 DIAGNOSIS — M5441 Lumbago with sciatica, right side: Secondary | ICD-10-CM | POA: Diagnosis not present

## 2016-06-28 DIAGNOSIS — M5136 Other intervertebral disc degeneration, lumbar region: Secondary | ICD-10-CM | POA: Diagnosis not present

## 2016-08-04 ENCOUNTER — Telehealth: Payer: Self-pay | Admitting: Family Medicine

## 2016-08-04 NOTE — Telephone Encounter (Signed)
Called James Pugh to schedule awv appt. Left msg for pt to call office to schedule appt.

## 2016-08-26 DIAGNOSIS — D485 Neoplasm of uncertain behavior of skin: Secondary | ICD-10-CM | POA: Diagnosis not present

## 2016-08-26 DIAGNOSIS — C44319 Basal cell carcinoma of skin of other parts of face: Secondary | ICD-10-CM | POA: Diagnosis not present

## 2016-08-26 DIAGNOSIS — L570 Actinic keratosis: Secondary | ICD-10-CM | POA: Diagnosis not present

## 2016-08-26 DIAGNOSIS — D1801 Hemangioma of skin and subcutaneous tissue: Secondary | ICD-10-CM | POA: Diagnosis not present

## 2016-08-26 DIAGNOSIS — Z85828 Personal history of other malignant neoplasm of skin: Secondary | ICD-10-CM | POA: Diagnosis not present

## 2016-09-22 DIAGNOSIS — Z85828 Personal history of other malignant neoplasm of skin: Secondary | ICD-10-CM | POA: Diagnosis not present

## 2016-09-22 DIAGNOSIS — C44319 Basal cell carcinoma of skin of other parts of face: Secondary | ICD-10-CM | POA: Diagnosis not present

## 2016-10-04 DIAGNOSIS — Z85828 Personal history of other malignant neoplasm of skin: Secondary | ICD-10-CM | POA: Diagnosis not present

## 2016-10-04 DIAGNOSIS — C44319 Basal cell carcinoma of skin of other parts of face: Secondary | ICD-10-CM | POA: Diagnosis not present

## 2016-10-13 ENCOUNTER — Other Ambulatory Visit (INDEPENDENT_AMBULATORY_CARE_PROVIDER_SITE_OTHER): Payer: Medicare Other

## 2016-10-13 DIAGNOSIS — R7303 Prediabetes: Secondary | ICD-10-CM | POA: Diagnosis not present

## 2016-10-13 DIAGNOSIS — I10 Essential (primary) hypertension: Secondary | ICD-10-CM

## 2016-10-13 DIAGNOSIS — C61 Malignant neoplasm of prostate: Secondary | ICD-10-CM | POA: Diagnosis not present

## 2016-10-13 DIAGNOSIS — E785 Hyperlipidemia, unspecified: Secondary | ICD-10-CM | POA: Diagnosis not present

## 2016-10-13 LAB — CBC WITH DIFFERENTIAL/PLATELET
Basophils Absolute: 0.1 10*3/uL (ref 0.0–0.1)
Basophils Relative: 1 % (ref 0.0–3.0)
EOS PCT: 3.8 % (ref 0.0–5.0)
Eosinophils Absolute: 0.2 10*3/uL (ref 0.0–0.7)
HEMATOCRIT: 43.1 % (ref 39.0–52.0)
HEMOGLOBIN: 14.5 g/dL (ref 13.0–17.0)
LYMPHS PCT: 36.4 % (ref 12.0–46.0)
Lymphs Abs: 2 10*3/uL (ref 0.7–4.0)
MCHC: 33.6 g/dL (ref 30.0–36.0)
MCV: 88.6 fl (ref 78.0–100.0)
Monocytes Absolute: 0.5 10*3/uL (ref 0.1–1.0)
Monocytes Relative: 8.5 % (ref 3.0–12.0)
Neutro Abs: 2.7 10*3/uL (ref 1.4–7.7)
Neutrophils Relative %: 50.3 % (ref 43.0–77.0)
Platelets: 283 10*3/uL (ref 150.0–400.0)
RBC: 4.86 Mil/uL (ref 4.22–5.81)
RDW: 13.2 % (ref 11.5–15.5)
WBC: 5.4 10*3/uL (ref 4.0–10.5)

## 2016-10-13 LAB — BASIC METABOLIC PANEL
BUN: 14 mg/dL (ref 6–23)
CO2: 31 mEq/L (ref 19–32)
Calcium: 9.7 mg/dL (ref 8.4–10.5)
Chloride: 105 mEq/L (ref 96–112)
Creatinine, Ser: 0.89 mg/dL (ref 0.40–1.50)
GFR: 89.22 mL/min (ref >60.00)
GLUCOSE: 127 mg/dL — AB (ref 70–99)
Potassium: 4.7 mEq/L (ref 3.5–5.1)
SODIUM: 139 meq/L (ref 135–145)

## 2016-10-13 LAB — HEPATIC FUNCTION PANEL
ALBUMIN: 4 g/dL (ref 3.5–5.2)
ALT: 20 U/L (ref 0–53)
AST: 16 U/L (ref 0–37)
Alkaline Phosphatase: 38 U/L — ABNORMAL LOW (ref 39–117)
Bilirubin, Direct: 0.1 mg/dL (ref 0.0–0.3)
Total Bilirubin: 0.3 mg/dL (ref 0.2–1.2)
Total Protein: 6.7 g/dL (ref 6.0–8.3)

## 2016-10-13 LAB — HEMOGLOBIN A1C: Hgb A1c MFr Bld: 6.6 % — ABNORMAL HIGH (ref 4.6–6.5)

## 2016-10-13 LAB — LIPID PANEL
CHOLESTEROL: 207 mg/dL — AB (ref 0–200)
HDL: 31.4 mg/dL — ABNORMAL LOW (ref >39.00)
LDL Cholesterol: 139 mg/dL — ABNORMAL HIGH (ref 0–99)
NONHDL: 175.13
Total CHOL/HDL Ratio: 7
Triglycerides: 183 mg/dL — ABNORMAL HIGH (ref 0.0–149.0)
VLDL: 36.6 mg/dL (ref 0.0–40.0)

## 2016-10-13 LAB — PSA: PSA: 0.77 ng/mL (ref 0.10–4.00)

## 2016-10-13 LAB — TSH: TSH: 0.65 u[IU]/mL (ref 0.35–4.50)

## 2016-10-21 ENCOUNTER — Other Ambulatory Visit: Payer: Medicare Other

## 2016-10-26 ENCOUNTER — Ambulatory Visit (INDEPENDENT_AMBULATORY_CARE_PROVIDER_SITE_OTHER): Payer: Medicare Other | Admitting: Family Medicine

## 2016-10-26 ENCOUNTER — Encounter: Payer: Self-pay | Admitting: Family Medicine

## 2016-10-26 VITALS — BP 120/70 | HR 72 | Ht 66.0 in | Wt 169.1 lb

## 2016-10-26 DIAGNOSIS — Z0000 Encounter for general adult medical examination without abnormal findings: Secondary | ICD-10-CM

## 2016-10-26 DIAGNOSIS — E785 Hyperlipidemia, unspecified: Secondary | ICD-10-CM

## 2016-10-26 DIAGNOSIS — R7309 Other abnormal glucose: Secondary | ICD-10-CM | POA: Diagnosis not present

## 2016-10-26 NOTE — Progress Notes (Signed)
Subjective:     Patient ID: James Pugh, male   DOB: [birth year], 71 y.o.   MRN: [id number]  HPI Patient seen for Medicare wellness visit and medical follow-up. Generally very healthy. He does have history of dyslipidemia and prediabetes. Not monitoring blood sugars. No polyuria or polydipsia. He has been trying to scale back sugar intake for the past few weeks. Poor compliance over the winter. During the past year, he had some right lower extremity pain. He saw multiple specialists and had MRI and nerve conduction velocities with no clear etiology. He eventually had epidural injection and pain is fully resolved at this time. Takes no medications other than baby aspirin. He walks some for exercise but no consistent exercise. No recent chest pains. No dyspnea.  No specific risk factors for hepatitis C. Donated blood 2 months ago Immunizations are up-to-date. He declines flu vaccination  He had recent skin cancer excised from his face. He is not sure if this was basal cell or squamous cell.  Past Medical History:  Diagnosis Date  . CERUMEN IMPACTION 08/22/2009  . HYPERLIPIDEMIA 06/06/2009  . PREDIABETES 07/13/2010   Past Surgical History:  Procedure Laterality Date  . TONSILLECTOMY      reports that he quit smoking about 33 years ago. His smoking use included Cigarettes. He has a 20.00 pack-year smoking history. He has never used smokeless tobacco. He reports that he does not drink alcohol or use drugs. family history includes Alcohol abuse in his other; Diabetes in his father; Heart disease in his father; Stroke in his father. No Known Allergies  1.  Risk factors based on Past Medical , Social, and Family history reviewed and as indicated above with no changes 2.  Limitations in physical activities None.  No recent falls. 3.  Depression/mood No active depression or anxiety issues 4.  Hearing No defiits 5.  ADLs independent in all. 6.  Cognitive function (orientation to time and place,  language, writing, speech,memory) no short or long term memory issues.  Language and judgement intact. 7.  Home Safety no issues 8.  Height, weight, and visual acuity.all stable. 9.  Counseling discussed -Dietary changes to restrict sugars and white starches and also saturated fats 10. Recommendation of preventive services. Flu vaccine offered and declined. He had recent blood donation so hepatitis C screening not indicated 11. Labs based on risk factors-recent labs reviewed with patient's 12. Care Plan-as above 13. Other Providers-none 14. Written schedule of screening/prevention services given to patient.   Review of Systems  Constitutional: Negative for activity change, appetite change, fatigue and fever.  HENT: Negative for congestion, ear pain and trouble swallowing.   Eyes: Negative for pain and visual disturbance.  Respiratory: Negative for cough, shortness of breath and wheezing.   Cardiovascular: Negative for chest pain and palpitations.  Gastrointestinal: Negative for abdominal distention, abdominal pain, blood in stool, constipation, diarrhea, nausea, rectal pain and vomiting.  Endocrine: Negative for polydipsia and polyuria.  Genitourinary: Negative for dysuria, hematuria and testicular pain.  Musculoskeletal: Negative for arthralgias and joint swelling.  Skin: Negative for rash.  Neurological: Negative for dizziness, syncope and headaches.  Hematological: Negative for adenopathy.  Psychiatric/Behavioral: Negative for confusion and dysphoric mood.       Objective:   Physical Exam  Constitutional: He is oriented to person, place, and time. He appears well-developed and well-nourished. No distress.  HENT:  Head: Normocephalic and atraumatic.  Right Ear: External ear normal.  Left Ear: External ear normal.  Mouth/Throat: Oropharynx is  clear and moist.  Eyes: Conjunctivae and EOM are normal. Pupils are equal, round, and reactive to light.  Neck: Normal range of motion.  Neck supple. No thyromegaly present.  Cardiovascular: Normal rate, regular rhythm and normal heart sounds.   No murmur heard. Pulmonary/Chest: No respiratory distress. He has no wheezes. He has no rales.  Abdominal: Soft. Bowel sounds are normal. He exhibits no distension and no mass. There is no tenderness. There is no rebound and no guarding.  Musculoskeletal: He exhibits no edema.  Lymphadenopathy:    He has no cervical adenopathy.  Neurological: He is alert and oriented to person, place, and time. He displays normal reflexes. No cranial nerve deficit.  Skin: No rash noted.  Psychiatric: He has a normal mood and affect.       Assessment:     #1 Medicare subsequent annual wellness visit-flu vaccine offered and declined. Other immunizations up-to-date  #2 hyperglycemia. Recent A1c 6.6%  #3 dyslipidemia with high triglycerides and low HDL      Plan:     -Discussed dietary modification- especially reduction in saturated fats and sugars and starches -Establish more consistent exercise -Follow-up in 6 months and repeat A1c then  [name] MD [facility]

## 2016-10-26 NOTE — Progress Notes (Signed)
Pre visit review using our clinic review tool, if applicable. No additional management support is needed unless otherwise documented below in the visit note. 

## 2016-10-26 NOTE — Patient Instructions (Signed)
Hyperglycemia  Hyperglycemia occurs when the level of sugar (glucose) in the blood is too high. Glucose is a type of sugar that provides the body's main source of energy. Certain hormones (insulin and glucagon) control the level of glucose in the blood. Insulin lowers blood glucose, and glucagon increases blood glucose. Hyperglycemia can result from having too little insulin in the bloodstream, or from the body not responding normally to insulin.  Hyperglycemia occurs most often in people who have diabetes (diabetes mellitus), but it can happen in people who do not have diabetes. It can develop quickly, and it can be life-threatening if it causes you to become severely dehydrated (diabetic ketoacidosis or hyperglycemic hyperosmolar state). Severe hyperglycemia is a medical emergency.  What are the causes?  If you have diabetes, hyperglycemia may be caused by:  · Diabetes medicine.  · Medicines that increase blood glucose or affect your diabetes control.  · Not eating enough, or not eating often enough.  · Changes in physical activity level.  · Being sick or having an infection.    If you have prediabetes or undiagnosed diabetes:  · Hyperglycemia may be caused by those conditions.    If you do not have diabetes, hyperglycemia may be caused by:  · Certain medicines, including steroid medicines, beta-blockers, epinephrine, and thiazide diuretics.  · Stress.  · Serious illness.  · Surgery.  · Diseases of the pancreas.  · Infection.    What increases the risk?  Hyperglycemia is more likely to develop in people who have risk factors for diabetes, such as:  · Having a family member with diabetes.  · Having a gene for type 1 diabetes that is passed from parent to child (inherited).  · Living in an area with cold weather conditions.  · Exposure to certain viruses.  · Certain conditions in which the body's disease-fighting (immune) system attacks itself (autoimmune disorders).  · Being overweight or obese.  · Having an  inactive (sedentary) lifestyle.  · Having been diagnosed with insulin resistance.  · Having a history of prediabetes, gestational diabetes, or polycystic ovarian syndrome (PCOS).  · Being of American-Indian, African-American, Hispanic/Latino, or Asian/Pacific Islander descent.    What are the signs or symptoms?  Hyperglycemia may not cause any symptoms. If you do have symptoms, they may include early warning signs, such as:  · Increased thirst.  · Hunger.  · Feeling very tired.  · Needing to urinate more often than usual.  · Blurry vision.    Other symptoms may develop if hyperglycemia gets worse, such as:  · Dry mouth.  · Loss of appetite.  · Fruity-smelling breath.  · Weakness.  · Unexpected or rapid weight gain or weight loss.  · Tingling or numbness in the hands or feet.  · Headache.  · Skin that does not quickly return to normal after being lightly pinched and released (poor skin turgor).  · Abdominal pain.  · Cuts or bruises that are slow to heal.    How is this diagnosed?  Hyperglycemia is diagnosed with a blood test to measure your blood glucose level. This blood test is usually done while you are having symptoms. Your health care provider may also do a physical exam and review your medical history.  You may have more tests to determine the cause of your hyperglycemia, such as:  · A fasting blood glucose (FBG) test. You will not be allowed to eat (you will fast) for at least 8 hours before a blood sample is   taken.  · An A1c (hemoglobin A1c) blood test. This provides information about blood glucose control over the previous 2-3 months.  · An oral glucose tolerance test (OGTT). This measures your blood glucose at two times:  ? After fasting. This is your baseline blood glucose level.  ? Two hours after drinking a beverage that contains glucose.    How is this treated?  Treatment depends on the cause of your hyperglycemia. Treatment may include:  · Taking medicine to regulate your blood glucose levels. If you  take insulin or other diabetes medicines, your medicine or dosage may be adjusted.  · Lifestyle changes, such as exercising more, eating healthier foods, or losing weight.  · Treating an illness or infection, if this caused your hyperglycemia.  · Checking your blood glucose more often.  · Stopping or reducing steroid medicines, if these caused your hyperglycemia.    If your hyperglycemia becomes severe and it results in hyperglycemic hyperosmolar state, you must be hospitalized and given IV fluids.  Follow these instructions at home:  General instructions   · Take over-the-counter and prescription medicines only as told by your health care provider.  · Do not use any products that contain nicotine or tobacco, such as cigarettes and e-cigarettes. If you need help quitting, ask your health care provider.  · Limit alcohol intake to no more than 1 drink per day for nonpregnant women and 2 drinks per day for men. One drink equals 12 oz of beer, 5 oz of wine, or 1½ oz of hard liquor.  · Learn to manage stress. If you need help with this, ask your health care provider.  · Keep all follow-up visits as told by your health care provider. This is important.  Eating and drinking   · Maintain a healthy weight.  · Exercise regularly, as directed by your health care provider.  · Stay hydrated, especially when you exercise, get sick, or spend time in hot temperatures.  · Eat healthy foods, such as:  ? Lean proteins.  ? Complex carbohydrates.  ? Fresh fruits and vegetables.  ? Low-fat dairy products.  ? Healthy fats.  · Drink enough fluid to keep your urine clear or pale yellow.  If you have diabetes:     · Make sure you know the symptoms of hyperglycemia.  · Follow your diabetes management plan, as told by your health care provider. Make sure you:  ? Take your insulin and medicines as directed.  ? Follow your exercise plan.  ? Follow your meal plan. Eat on time, and do not skip meals.  ? Check your blood glucose as often as  directed. Make sure to check your blood glucose before and after exercise. If you exercise longer or in a different way than usual, check your blood glucose more often.  ? Follow your sick day plan whenever you cannot eat or drink normally. Make this plan in advance with your health care provider.  · Share your diabetes management plan with people in your workplace, school, and household.  · Check your urine for ketones when you are ill and as told by your health care provider.  · Carry a medical alert card or wear medical alert jewelry.  Contact a health care provider if:  · Your blood glucose is at or above 240 mg/dL (13.3 mmol/L) for 2 days in a row.  · You have problems keeping your blood glucose in your target range.  · You have frequent episodes of hyperglycemia.    having symptoms. Your health care provider may also do a physical exam and review your medical history.  If you have diabetes, follow your diabetes management plan as told by your health care provider.  Contact your health care provider if you have problems keeping your blood glucose in your target range. This information is not intended to replace advice given to you by your health care provider. Make sure you discuss any questions you have with your health care provider. Document Released: 02/23/2001 Document Revised: 05/17/2016  Document Reviewed: 05/17/2016 Elsevier Interactive Patient Education  2017 [place] and Cholesterol Restricted Diet Introduction Getting too much fat and cholesterol in your diet may cause health problems. Following this diet helps keep your fat and cholesterol at normal levels. This can keep you from getting sick. What types of fat should I choose?  Choose monosaturated and polyunsaturated fats. These are found in foods such as olive oil, canola oil, flaxseeds, walnuts, almonds, and seeds.  Eat more omega-3 fats. Good choices include salmon, mackerel, sardines, tuna, flaxseed oil, and ground flaxseeds.  Limit saturated fats. These are in animal products such as meats, butter, and cream. They can also be in plant products such as palm oil, palm kernel oil, and coconut oil.  Avoid foods with partially hydrogenated oils in them. These contain trans fats. Examples of foods that have trans fats are stick margarine, some tub margarines, cookies, crackers, and other baked goods. What general guidelines do I need to follow?  Check food labels. Look for the words "trans fat" and "saturated fat."  When preparing a meal:  Fill half of your plate with vegetables and green salads.  Fill one fourth of your plate with whole grains. Look for the word "whole" as the first word in the ingredient list.  Fill one fourth of your plate with lean protein foods.  Eat more foods that have fiber, like apples, carrots, beans, peas, and barley.  Eat more home-cooked foods. Eat less at restaurants and buffets.  Limit or avoid alcohol.  Limit foods high in starch and sugar.  Limit fried foods.  Cook foods without frying them. Baking, boiling, grilling, and broiling are all great options.  Lose weight if you are overweight. Losing even a small amount of weight can help your overall health. It can also help prevent diseases such as diabetes and heart disease. What foods can I eat? Grains  Whole  grains, such as whole wheat or whole grain breads, crackers, cereals, and pasta. Unsweetened oatmeal, bulgur, barley, quinoa, or brown rice. Corn or whole wheat flour tortillas. Vegetables  Fresh or frozen vegetables (raw, steamed, roasted, or grilled). Green salads. Fruits  All fresh, canned (in natural juice), or frozen fruits. Meat and Other Protein Products  Ground beef (85% or leaner), grass-fed beef, or beef trimmed of fat. Skinless chicken or [place]. Ground chicken or [place]. Pork trimmed of fat. All fish and seafood. Eggs. Dried beans, peas, or lentils. Unsalted nuts or seeds. Unsalted canned or dry beans. Dairy  Low-fat dairy products, such as skim or 1% milk, 2% or reduced-fat cheeses, low-fat ricotta or cottage cheese, or plain low-fat yogurt. Fats and Oils  Tub margarines without trans fats. Light or reduced-fat mayonnaise and salad dressings. Avocado. Olive, canola, sesame, or safflower oils. Natural peanut or almond butter (choose ones without added sugar and oil). The items listed above may not be a complete list of recommended foods or beverages. Contact your dietitian for more options.  What foods are not  recommended? Grains  White bread. White pasta. White rice. Cornbread. Bagels, pastries, and croissants. Crackers that contain trans fat. Vegetables  White potatoes. Corn. Creamed or fried vegetables. Vegetables in a cheese sauce. Fruits  Dried fruits. Canned fruit in light or heavy syrup. Fruit juice. Meat and Other Protein Products  Fatty cuts of meat. Ribs, chicken wings, bacon, sausage, bologna, salami, chitterlings, fatback, hot dogs, bratwurst, and packaged luncheon meats. Liver and organ meats. Dairy  Whole or 2% milk, cream, half-and-half, and cream cheese. Whole milk cheeses. Whole-fat or sweetened yogurt. Full-fat cheeses. Nondairy creamers and whipped toppings. Processed cheese, cheese spreads, or cheese curds. Sweets and Desserts  Corn syrup, sugars, honey, and  molasses. Candy. Jam and jelly. Syrup. Sweetened cereals. Cookies, pies, cakes, donuts, muffins, and ice cream. Fats and Oils  Butter, stick margarine, lard, shortening, ghee, or bacon fat. Coconut, palm kernel, or palm oils. Beverages  Alcohol. Sweetened drinks (such as sodas, lemonade, and fruit drinks or punches). The items listed above may not be a complete list of foods and beverages to avoid. Contact your dietitian for more information.  This information is not intended to replace advice given to you by your health care provider. Make sure you discuss any questions you have with your health care provider. Document Released: 02/29/2012 Document Revised: 05/06/2016 Document Reviewed: 11/29/2013  2017 Elsevier  Let's plan on 6 month follow up and recheck A1C then.

## 2017-03-03 DIAGNOSIS — D1801 Hemangioma of skin and subcutaneous tissue: Secondary | ICD-10-CM | POA: Diagnosis not present

## 2017-03-03 DIAGNOSIS — L821 Other seborrheic keratosis: Secondary | ICD-10-CM | POA: Diagnosis not present

## 2017-03-03 DIAGNOSIS — Z85828 Personal history of other malignant neoplasm of skin: Secondary | ICD-10-CM | POA: Diagnosis not present

## 2017-03-03 DIAGNOSIS — L570 Actinic keratosis: Secondary | ICD-10-CM | POA: Diagnosis not present

## 2017-03-04 DIAGNOSIS — H53001 Unspecified amblyopia, right eye: Secondary | ICD-10-CM | POA: Diagnosis not present

## 2017-03-04 DIAGNOSIS — H2513 Age-related nuclear cataract, bilateral: Secondary | ICD-10-CM | POA: Diagnosis not present

## 2017-03-04 DIAGNOSIS — H524 Presbyopia: Secondary | ICD-10-CM | POA: Diagnosis not present

## 2017-04-25 ENCOUNTER — Ambulatory Visit: Payer: Medicare Other | Admitting: Family Medicine

## 2017-05-09 IMAGING — MR MR LUMBAR SPINE W/O CM
4 of 5 series · 21 of 48 positions shown · non-contrast
Comparison: None.

CLINICAL DATA: Left hip, buttock and leg pain for 1 month. No known
injury. Initial encounter.

EXAM:
MRI LUMBAR SPINE WITHOUT CONTRAST
TECHNIQUE: Multiplanar, multisequence MR imaging of the lumbar spine was
performed. No intravenous contrast was administered.

[Series 7: T1 · sagittal · 4.0mm · 0.73mm/px · 3 of 15 slices shown (1 of 2)]
[im 3/15]
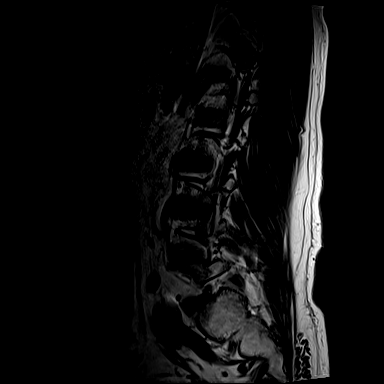
[im 9/15]
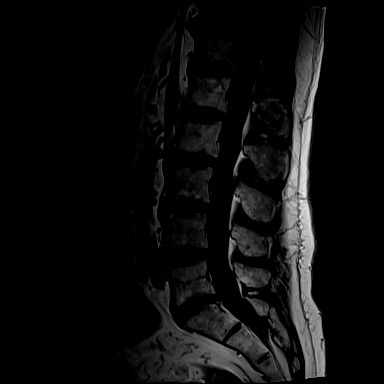
[im 15/15]
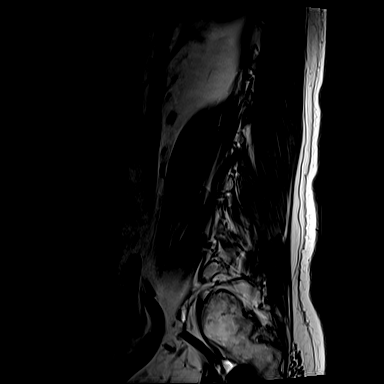

[Series 8: T2 · sagittal · 4.0mm · 0.73mm/px · 6 of 15 slices shown (1 of 2)]
[im 1/15]
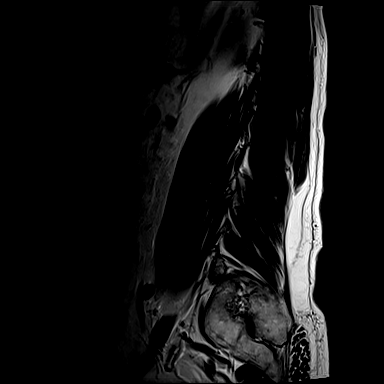
[im 3/15]
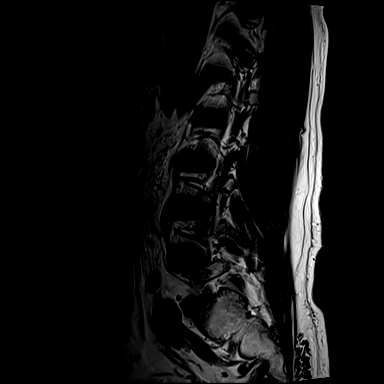
[im 6/15]
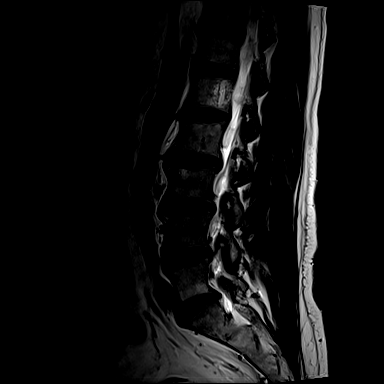
[im 9/15]
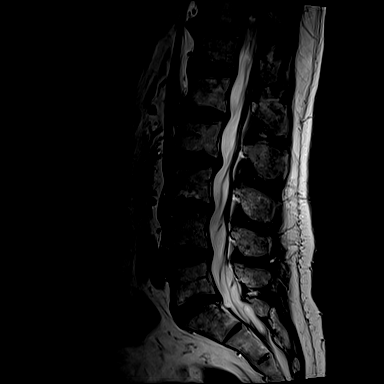
[im 12/15]
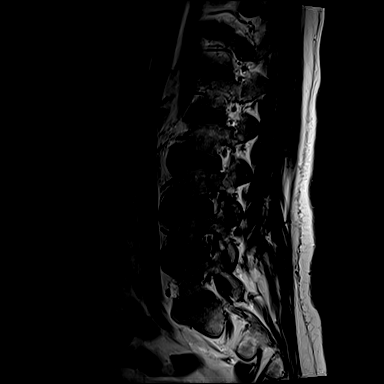
[im 15/15]
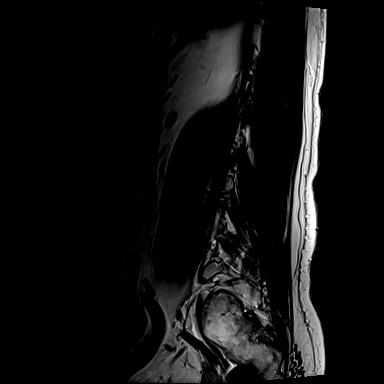

[Series 9: T1 · axial · 4.0mm · 0.56mm/px · z∈[-89,+71]mm · 3 of 41 slices shown (2 of 2)]
[im 6/41]
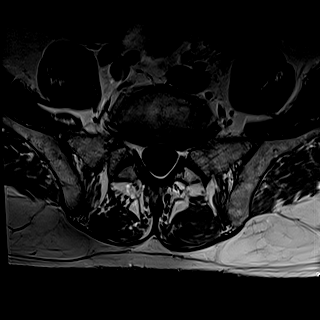
[im 21/41]
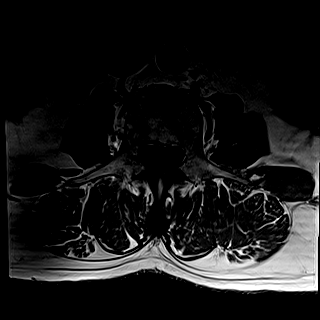
[im 35/41]
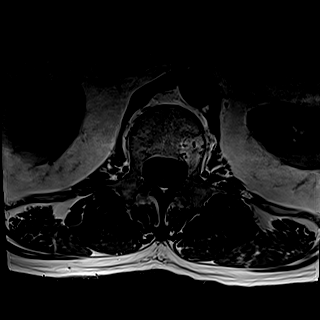

[Series 10: T2 · axial · 4.0mm · 0.28mm/px · z∈[-114,+101]mm · 9 of 41 slices shown (2 of 2)]
[im 1/41]
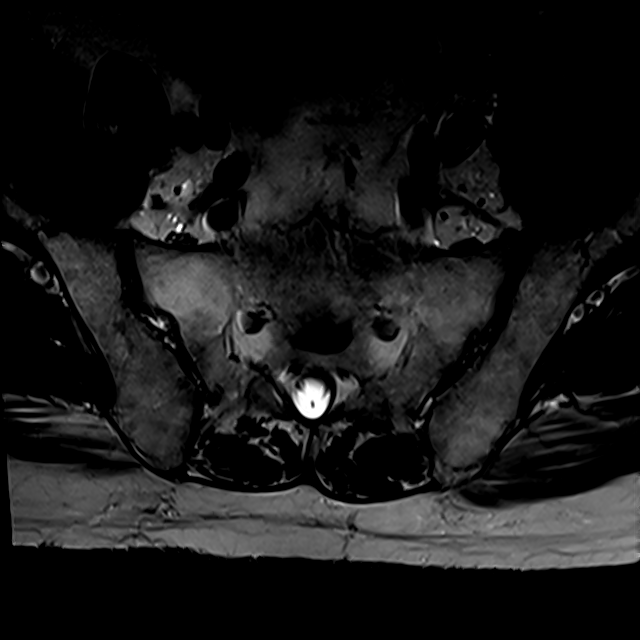
[im 6/41]
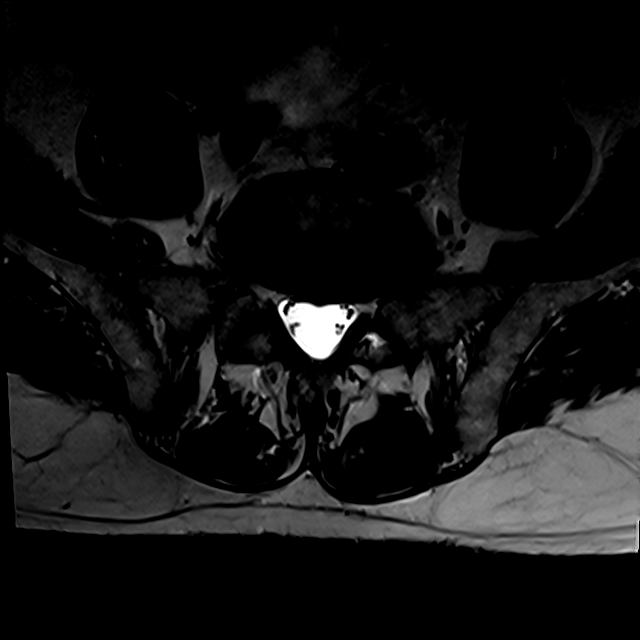
[im 12/41]
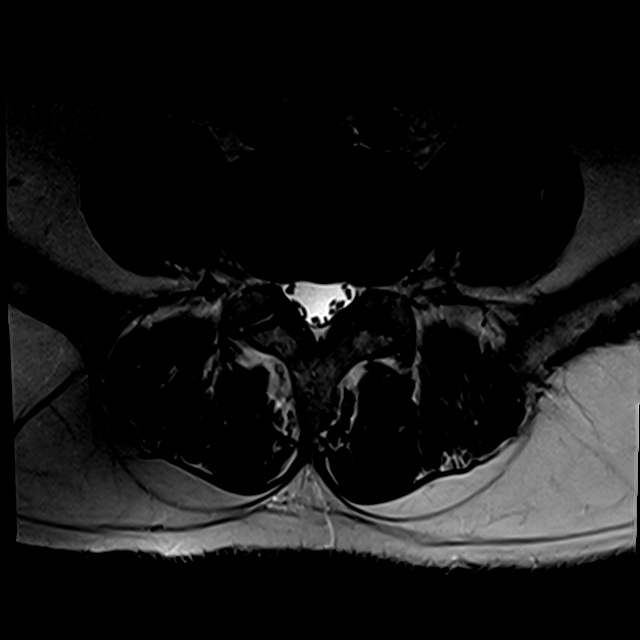
[im 18/41]
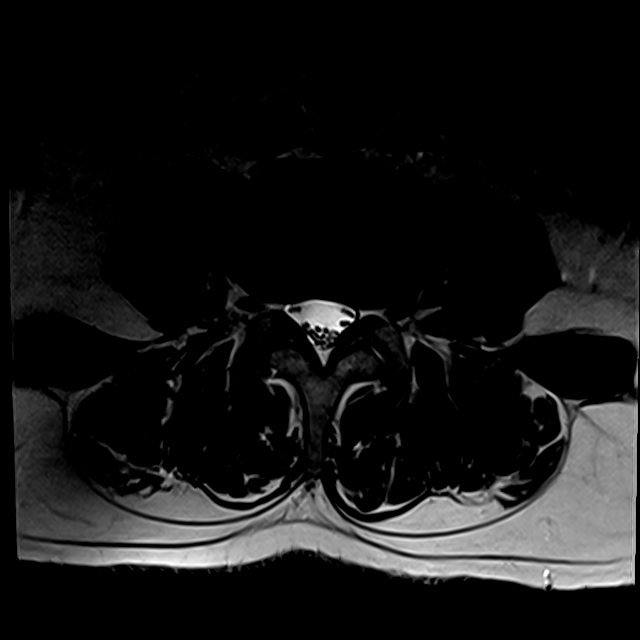
[im 21/41]
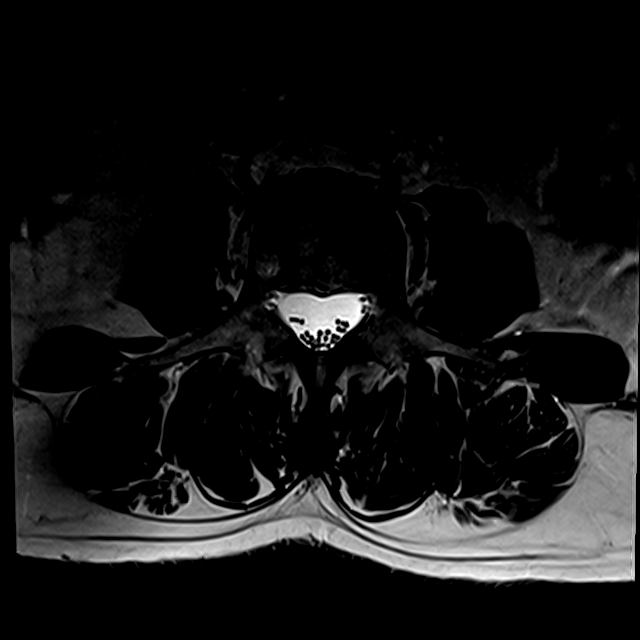
[im 23/41]
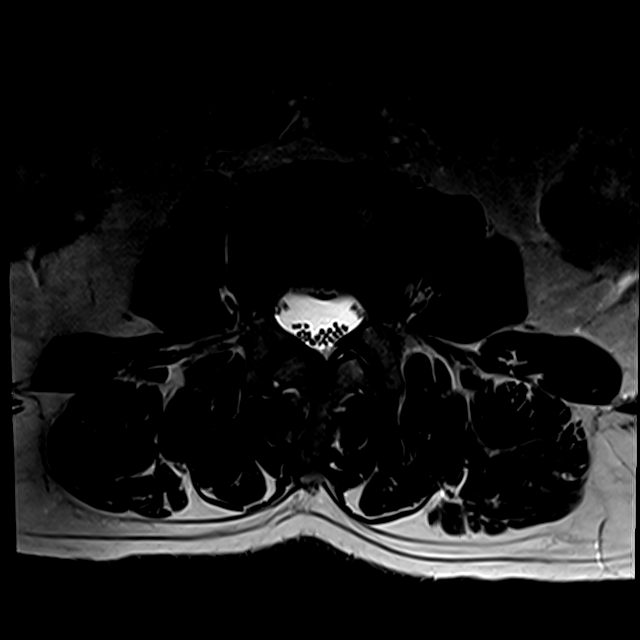
[im 29/41]
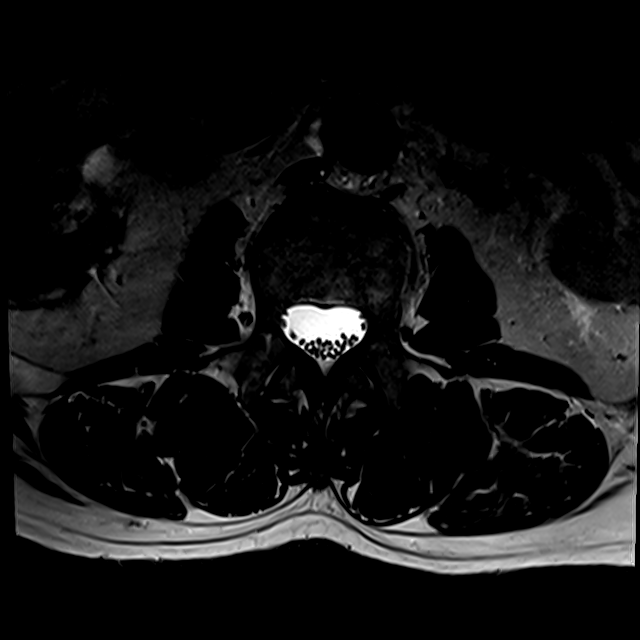
[im 35/41]
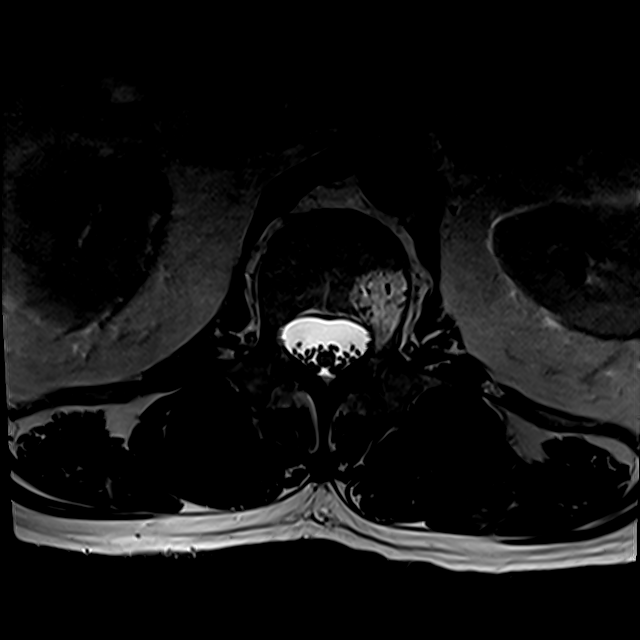
[im 41/41]
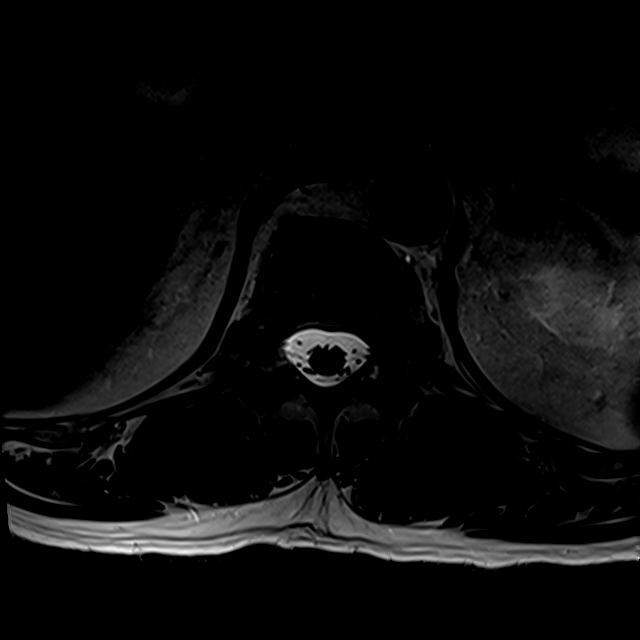

[21 of 48 positions shown; findings below may reference images not displayed]

FINDINGS: Segmentation:  Maintained.

Alignment:  Anatomic.

Vertebrae: No fracture. Hemangioma in L1 is incidentally noted. A
few small Schmorl's nodes are seen. No worrisome marrow lesion.

Conus medullaris: Extends to the L1 level and appears normal.

Paraspinal and other soft tissues: Unremarkable.

Disc levels:

T11-12 is imaged in the sagittal plane only and negative.

T12-L1:  Negative.

L1-2:  Negative.

L2-3: A synovial or ganglion cyst measuring 0.5 cm AP by 0.3 cm
transverse by 0.8 cm craniocaudal is seen subjacent to the right
ligamentum flavum posteriorly and does not impact the thecal sac. A
shallow disc bulge and small annular fissure are identified. The
central canal and foramina are widely patent.

L3-4: A very shallow central protrusion without central canal
stenosis is identified. The foramina are open.

L4-5: Shallow disc bulge without central canal or foraminal
narrowing.

L5-S1: Shallow disc bulge without central canal or foraminal
narrowing.
IMPRESSION: Mild spondylosis of the lumbar spine as described above. The central
canal and foramina appear open at all levels.

## 2017-05-13 DIAGNOSIS — R0789 Other chest pain: Secondary | ICD-10-CM | POA: Diagnosis not present

## 2017-05-13 DIAGNOSIS — R071 Chest pain on breathing: Secondary | ICD-10-CM | POA: Diagnosis not present

## 2017-05-13 DIAGNOSIS — Z7982 Long term (current) use of aspirin: Secondary | ICD-10-CM | POA: Diagnosis not present

## 2017-05-13 DIAGNOSIS — Z87891 Personal history of nicotine dependence: Secondary | ICD-10-CM | POA: Diagnosis not present

## 2017-05-13 DIAGNOSIS — R079 Chest pain, unspecified: Secondary | ICD-10-CM | POA: Diagnosis not present

## 2017-05-13 DIAGNOSIS — J9811 Atelectasis: Secondary | ICD-10-CM | POA: Diagnosis not present

## 2017-05-23 ENCOUNTER — Ambulatory Visit: Payer: Medicare Other | Admitting: Family Medicine

## 2017-05-24 DIAGNOSIS — H25811 Combined forms of age-related cataract, right eye: Secondary | ICD-10-CM | POA: Diagnosis not present

## 2017-05-24 DIAGNOSIS — H2511 Age-related nuclear cataract, right eye: Secondary | ICD-10-CM | POA: Diagnosis not present

## 2017-06-02 ENCOUNTER — Encounter: Payer: Self-pay | Admitting: Family Medicine

## 2017-06-07 DIAGNOSIS — H2512 Age-related nuclear cataract, left eye: Secondary | ICD-10-CM | POA: Diagnosis not present

## 2017-06-07 DIAGNOSIS — H25012 Cortical age-related cataract, left eye: Secondary | ICD-10-CM | POA: Diagnosis not present

## 2017-06-07 DIAGNOSIS — H25812 Combined forms of age-related cataract, left eye: Secondary | ICD-10-CM | POA: Diagnosis not present

## 2017-07-18 ENCOUNTER — Ambulatory Visit (INDEPENDENT_AMBULATORY_CARE_PROVIDER_SITE_OTHER): Payer: Medicare Other | Admitting: Family Medicine

## 2017-07-18 ENCOUNTER — Encounter: Payer: Self-pay | Admitting: Family Medicine

## 2017-07-18 VITALS — BP 128/68 | HR 59

## 2017-07-18 DIAGNOSIS — R7303 Prediabetes: Secondary | ICD-10-CM

## 2017-07-18 DIAGNOSIS — Z0000 Encounter for general adult medical examination without abnormal findings: Secondary | ICD-10-CM

## 2017-07-18 LAB — POCT GLYCOSYLATED HEMOGLOBIN (HGB A1C): Hemoglobin A1C: 6.1

## 2017-07-18 NOTE — Progress Notes (Signed)
Subjective:     Patient ID: James Pugh, male   DOB: 02-02-1946, 71 y.o.   MRN: [id number]  HPI Patient is here for follow-up or guarding prediabetes. Get A1c which is actually early diabetes range of 6.6% several months ago. No polyuria or polydipsia. Not monitoring blood sugars regularly. His weight is down a few pounds. He has had some stress recently in dealing with a leak problem in his house. Takes aspirin but no other medications. He declines flu vaccine.  Past Medical History:  Diagnosis Date  . CERUMEN IMPACTION 08/22/2009  . HYPERLIPIDEMIA 06/06/2009  . PREDIABETES 07/13/2010   Past Surgical History:  Procedure Laterality Date  . TONSILLECTOMY      reports that he quit smoking about 33 years ago. His smoking use included cigarettes. He has a 20.00 pack-year smoking history. he has never used smokeless tobacco. He reports that he does not drink alcohol or use drugs. family history includes Alcohol abuse in his other; Diabetes in his father; Heart disease in his father; Stroke in his father. No Known Allergies   Review of Systems  Constitutional: Negative for fatigue.  Eyes: Negative for visual disturbance.  Respiratory: Negative for cough, chest tightness and shortness of breath.   Cardiovascular: Negative for chest pain, palpitations and leg swelling.  Endocrine: Negative for polydipsia and polyuria.  Neurological: Negative for dizziness, syncope, weakness, light-headedness and headaches.       Objective:   Physical Exam  Constitutional: He is oriented to person, place, and time. He appears well-developed and well-nourished.  HENT:  Right Ear: External ear normal.  Left Ear: External ear normal.  Mouth/Throat: Oropharynx is clear and moist.  Eyes: Pupils are equal, round, and reactive to light.  Neck: Neck supple. No thyromegaly present.  Cardiovascular: Normal rate and regular rhythm.  Pulmonary/Chest: Effort normal and breath sounds normal. No respiratory  distress. He has no wheezes. He has no rales.  Musculoskeletal: He exhibits no edema.  Neurological: He is alert and oriented to person, place, and time.  Skin: No rash noted.       Assessment:     History prediabetes/early type 2 diabetes improved today with A1c 6.1%    Plan:     -Continue lifestyle management of hyperglycemia -Recommend flu vaccine and he declines -Will schedule physical exam for after February 2019  [name] MD [facility]

## 2017-07-18 NOTE — Patient Instructions (Signed)
Set up physical for after 10-26-17.

## 2017-09-02 DIAGNOSIS — Z85828 Personal history of other malignant neoplasm of skin: Secondary | ICD-10-CM | POA: Diagnosis not present

## 2017-09-02 DIAGNOSIS — L812 Freckles: Secondary | ICD-10-CM | POA: Diagnosis not present

## 2017-09-02 DIAGNOSIS — L853 Xerosis cutis: Secondary | ICD-10-CM | POA: Diagnosis not present

## 2017-09-02 DIAGNOSIS — L821 Other seborrheic keratosis: Secondary | ICD-10-CM | POA: Diagnosis not present

## 2017-09-02 DIAGNOSIS — L570 Actinic keratosis: Secondary | ICD-10-CM | POA: Diagnosis not present

## 2017-09-02 DIAGNOSIS — D1801 Hemangioma of skin and subcutaneous tissue: Secondary | ICD-10-CM | POA: Diagnosis not present

## 2017-10-22 ENCOUNTER — Other Ambulatory Visit: Payer: Self-pay

## 2017-10-22 ENCOUNTER — Emergency Department (HOSPITAL_COMMUNITY): Payer: Medicare Other

## 2017-10-22 ENCOUNTER — Inpatient Hospital Stay (HOSPITAL_COMMUNITY)
Admission: EM | Admit: 2017-10-22 | Discharge: 2017-10-24 | DRG: 065 | Disposition: A | Discharge status: Rehabilitation Facility | Payer: Medicare Other | Attending: Internal Medicine | Admitting: Internal Medicine

## 2017-10-22 ENCOUNTER — Encounter (HOSPITAL_COMMUNITY): Payer: Self-pay | Admitting: Emergency Medicine

## 2017-10-22 DIAGNOSIS — I6502 Occlusion and stenosis of left vertebral artery: Secondary | ICD-10-CM | POA: Diagnosis present

## 2017-10-22 DIAGNOSIS — E663 Overweight: Secondary | ICD-10-CM | POA: Diagnosis present

## 2017-10-22 DIAGNOSIS — G8194 Hemiplegia, unspecified affecting left nondominant side: Secondary | ICD-10-CM | POA: Diagnosis not present

## 2017-10-22 DIAGNOSIS — E119 Type 2 diabetes mellitus without complications: Secondary | ICD-10-CM

## 2017-10-22 DIAGNOSIS — I6523 Occlusion and stenosis of bilateral carotid arteries: Secondary | ICD-10-CM | POA: Diagnosis present

## 2017-10-22 DIAGNOSIS — Z7982 Long term (current) use of aspirin: Secondary | ICD-10-CM

## 2017-10-22 DIAGNOSIS — Z6827 Body mass index (BMI) 27.0-27.9, adult: Secondary | ICD-10-CM

## 2017-10-22 DIAGNOSIS — E785 Hyperlipidemia, unspecified: Secondary | ICD-10-CM | POA: Diagnosis not present

## 2017-10-22 DIAGNOSIS — R260 Ataxic gait: Secondary | ICD-10-CM | POA: Diagnosis present

## 2017-10-22 DIAGNOSIS — Z87891 Personal history of nicotine dependence: Secondary | ICD-10-CM

## 2017-10-22 DIAGNOSIS — I6302 Cerebral infarction due to thrombosis of basilar artery: Secondary | ICD-10-CM | POA: Diagnosis not present

## 2017-10-22 DIAGNOSIS — G4733 Obstructive sleep apnea (adult) (pediatric): Secondary | ICD-10-CM | POA: Diagnosis present

## 2017-10-22 DIAGNOSIS — I10 Essential (primary) hypertension: Secondary | ICD-10-CM

## 2017-10-22 DIAGNOSIS — I6389 Other cerebral infarction: Secondary | ICD-10-CM

## 2017-10-22 DIAGNOSIS — I69322 Dysarthria following cerebral infarction: Secondary | ICD-10-CM

## 2017-10-22 DIAGNOSIS — R471 Dysarthria and anarthria: Secondary | ICD-10-CM | POA: Diagnosis not present

## 2017-10-22 DIAGNOSIS — R29706 NIHSS score 6: Secondary | ICD-10-CM | POA: Diagnosis present

## 2017-10-22 DIAGNOSIS — I6381 Other cerebral infarction due to occlusion or stenosis of small artery: Secondary | ICD-10-CM | POA: Diagnosis not present

## 2017-10-22 DIAGNOSIS — Z9849 Cataract extraction status, unspecified eye: Secondary | ICD-10-CM

## 2017-10-22 DIAGNOSIS — I639 Cerebral infarction, unspecified: Secondary | ICD-10-CM | POA: Diagnosis not present

## 2017-10-22 DIAGNOSIS — R29818 Other symptoms and signs involving the nervous system: Secondary | ICD-10-CM | POA: Diagnosis not present

## 2017-10-22 DIAGNOSIS — R200 Anesthesia of skin: Secondary | ICD-10-CM | POA: Diagnosis not present

## 2017-10-22 LAB — BASIC METABOLIC PANEL
ANION GAP: 10 (ref 5–15)
BUN: 11 mg/dL (ref 6–20)
CHLORIDE: 102 mmol/L (ref 101–111)
CO2: 25 mmol/L (ref 22–32)
Calcium: 9.4 mg/dL (ref 8.9–10.3)
Creatinine, Ser: 0.99 mg/dL (ref 0.61–1.24)
GFR calc Af Amer: >60 mL/min (ref >60)
GLUCOSE: 138 mg/dL — AB (ref 65–99)
POTASSIUM: 4.2 mmol/L (ref 3.5–5.1)
Sodium: 137 mmol/L (ref 135–145)

## 2017-10-22 LAB — URINALYSIS, ROUTINE W REFLEX MICROSCOPIC
BILIRUBIN URINE: NEGATIVE
Glucose, UA: NEGATIVE mg/dL
Hgb urine dipstick: NEGATIVE
Ketones, ur: 20 mg/dL — AB
Leukocytes, UA: NEGATIVE
NITRITE: NEGATIVE
PH: 6 (ref 5.0–8.0)
Protein, ur: NEGATIVE mg/dL
SPECIFIC GRAVITY, URINE: 1.018 (ref 1.005–1.030)

## 2017-10-22 LAB — CBC
HEMATOCRIT: 42.5 % (ref 39.0–52.0)
HEMOGLOBIN: 14.2 g/dL (ref 13.0–17.0)
MCH: 29.3 pg (ref 26.0–34.0)
MCHC: 33.4 g/dL (ref 30.0–36.0)
MCV: 87.6 fL (ref 78.0–100.0)
Platelets: 337 10*3/uL (ref 150–400)
RBC: 4.85 MIL/uL (ref 4.22–5.81)
RDW: 14 % (ref 11.5–15.5)
WBC: 6.8 10*3/uL (ref 4.0–10.5)

## 2017-10-22 LAB — TROPONIN I

## 2017-10-22 LAB — CBG MONITORING, ED: Glucose-Capillary: 108 mg/dL — ABNORMAL HIGH (ref 65–99)

## 2017-10-22 MED ORDER — ACETAMINOPHEN 325 MG PO TABS: 650.0000 mg | ORAL_TABLET | ORAL | Status: DC | PRN

## 2017-10-22 MED ORDER — SENNOSIDES-DOCUSATE SODIUM 8.6-50 MG PO TABS: 1.0000 | ORAL_TABLET | Freq: Every evening | ORAL | Status: DC | PRN

## 2017-10-22 MED ORDER — CLOPIDOGREL BISULFATE 75 MG PO TABS
75.0000 mg | ORAL_TABLET | Freq: Every day | ORAL | Status: DC
  Administered 2017-10-23 – 2017-10-24 (×2): 75 mg via ORAL
  Filled 2017-10-22 (×2): qty 1

## 2017-10-22 MED ORDER — SODIUM CHLORIDE 0.9 % IV BOLUS (SEPSIS)
1000.0000 mL | Freq: Once | INTRAVENOUS | Status: AC
  Administered 2017-10-22: 1000 mL via INTRAVENOUS

## 2017-10-22 MED ORDER — SODIUM CHLORIDE 0.9 % IV BOLUS (SEPSIS)
500.0000 mL | Freq: Once | INTRAVENOUS | Status: AC
  Administered 2017-10-22: 500 mL via INTRAVENOUS

## 2017-10-22 MED ORDER — ACETAMINOPHEN 650 MG RE SUPP: 650.0000 mg | RECTAL | Status: DC | PRN

## 2017-10-22 MED ORDER — SODIUM CHLORIDE 0.9 % IV SOLN
INTRAVENOUS | Status: DC
  Administered 2017-10-22 – 2017-10-24 (×3): via INTRAVENOUS

## 2017-10-22 MED ORDER — GADOBENATE DIMEGLUMINE 529 MG/ML IV SOLN
15.0000 mL | Freq: Once | INTRAVENOUS | Status: AC
  Administered 2017-10-22: 15 mL via INTRAVENOUS

## 2017-10-22 MED ORDER — CLOPIDOGREL BISULFATE 75 MG PO TABS
300.0000 mg | ORAL_TABLET | Freq: Every day | ORAL | Status: DC
  Administered 2017-10-22: 300 mg via ORAL
  Filled 2017-10-22: qty 4

## 2017-10-22 MED ORDER — STROKE: EARLY STAGES OF RECOVERY BOOK
Freq: Once | Status: AC
  Administered 2017-10-22: 22:00:00

## 2017-10-22 MED ORDER — ATORVASTATIN CALCIUM 80 MG PO TABS
80.0000 mg | ORAL_TABLET | Freq: Every day | ORAL | Status: DC
  Administered 2017-10-22 – 2017-10-24 (×3): 80 mg via ORAL
  Filled 2017-10-22 (×3): qty 1

## 2017-10-22 MED ORDER — ACETAMINOPHEN 160 MG/5ML PO SOLN: 650.0000 mg | ORAL | Status: DC | PRN

## 2017-10-22 MED ORDER — ASPIRIN 325 MG PO TABS
325.0000 mg | ORAL_TABLET | Freq: Every day | ORAL | Status: DC
  Administered 2017-10-22 – 2017-10-23 (×2): 325 mg via ORAL
  Filled 2017-10-22 (×2): qty 1

## 2017-10-22 MED ORDER — STROKE: EARLY STAGES OF RECOVERY BOOK
Freq: Once | Status: AC
  Administered 2017-10-22: 16:00:00
  Filled 2017-10-22: qty 1

## 2017-10-22 MED ORDER — ENOXAPARIN SODIUM 40 MG/0.4ML ~~LOC~~ SOLN
40.0000 mg | SUBCUTANEOUS | Status: DC
  Administered 2017-10-22 – 2017-10-23 (×2): 40 mg via SUBCUTANEOUS
  Filled 2017-10-22 (×2): qty 0.4

## 2017-10-22 NOTE — Consult Note (Signed)
Referring Physician: Hayden Rasmussen, MD    Chief Complaint: left leg weakness  HPI: James Pugh is an 72 y.o. male without any significant past medical history presents to the ED with persistent left upper and lower extremity weakness, numbness tingling and ataxic gait which began the night before at 9 PM.  Patient states last night around 9 PM he noted intermittent jerking of his left arm it would last for 30 seconds and continued until almost 4 AM this morning.  Also that time, he noted he was leaning more to his left side with left upper extremity and left lower extremity weakness-feelings of  loss of sensation, numbness and tingling in the left upper and left lower extremity and left face as well as unsteady gait.  He noted his speech had been a little bit slurred but he went to bed and woke up this morning without any improvement in his symptoms.  He called his daughter who was able to confirm that patient did have slurred speech with word finding difficulties.  Admits to feeling off balanced yesterday through to today, denies associated headache, dizziness, visual changes-he had recent cataract surgery,, nausea or vomiting.  Patient denies chest pain, shortness of breath recent fevers, chills or  illnesses but admits to being under immense stress.    Patient was brought to the ER by his daughter, who describes patient with unsteady gait as he walked into the ER.  Initial Noncon CT of the head did not show any acute abnormalities.  Due to patient presenting with posterior ischemic stroke symptoms, neuro was consulted.  She denies a personal history of stroke but admits to history of stroke in his brother, as well as over 38-year 1 pack/day smoking history.  Of note about 4 days ago, patient hit his head on a beam in his crawl space.  He said he hit the top of his head, without any resultant loss of consciousness but he did see stars and denied any confusion.  Patient's vitals and abs have remained  stable while in the ER.  On assessment , patient is awake alert and oriented to self place and time with normal comprehension, and repetition and recall. Family states slurred speech has been intermittent and it was evident during assessment.    LSN: 10/21/17 9pm tPA Given: No: out of the time frame window  Premorbid modified Rankin scale (mRS):  0  Past Medical History:  Diagnosis Date  . CERUMEN IMPACTION 08/22/2009  . HYPERLIPIDEMIA 06/06/2009  . PREDIABETES 07/13/2010    Past Surgical History:  Procedure Laterality Date  . TONSILLECTOMY      Family History  Problem Relation Age of Onset  . Stroke Father   . Heart disease Father   . Diabetes Father        type ll  . Alcohol abuse Other    Social History:  reports that he quit smoking about 34 years ago. His smoking use included cigarettes. He has a 20.00 pack-year smoking history. he has never used smokeless tobacco. He reports that he does not drink alcohol or use drugs.  Allergies: No Known Allergies  Medications:  Scheduled: . aspirin  325 mg Oral Daily  . atorvastatin  80 mg Oral q1800    ROS: 13 point systems reviewed with patient and are negative except for above in HPI   Physical Examination: Blood pressure (!) 149/69, pulse (!) 50, temperature 97.9 F (36.6 C), resp. rate 18, height 5\' 6"  (1.676 m), weight 76.2  kg (168 lb), SpO2 97 %. HEENT-  Normocephalic, no lesions, without obvious abnormality.  Normal external eye and conjunctiva.   Cardiovascular- S1-S2 audible, pulses palpable throughout   Lungs-no rhonchi or wheezing noted, no excessive working breathing.  Saturations within normal limits Abdomen- All 4 quadrants palpated and nontender Extremities- Warm, dry and intact Musculoskeletal-no joint tenderness, deformity or swelling Skin-warm and dry,   Neurological Examination Mental Status: Alert, oriented, thought content appropriate.  Speech fluent with mild dysarthria able to follow 3 step  commands without difficulty. Cranial Nerves: II: Discs flat bilaterally; Visual fields grossly normal, -recent cataract surgery III,IV, VI: ptosis not present, extra-ocular motions intact bilaterally pupils equal, round, reactive to light and accommodation V,VII: Left nasolabial flattening, smile mildly assymmetric, left facial weakness and decreased sensation on left side of face VIII: Hearing intact to voice IX,X: uvula rises symmetrically XI: bilateral shoulder shrug XII: midline tongue extension Motor: Right : Upper extremity/ tricep/bicep   5/5    Left:     Upper extremity/bicep/tricep   4/5    Lower extremity   5/5     Lower extremity   4/5  Abduction adduction 5/5    Abduction/adduction 5/5 Tone and bulk:normal tone throughout; no atrophy noted Sensory: Decreased sensation to pinprick and light touch on the left face, upper and left lower extremity Deep Tendon Reflexes: Admitted  plantars: Right: downgoing   Left: downgoing Cerebellar: Left hand ataxia with normal finger-to-nose, left hand stationary with orbiting, mild ataxia with left  heel-to-shin test Gait: Not assessed secondary to safety  NIHSS 1a Level of Conscious.: 0 1b LOC Questions: 0 1c LOC Commands: 0 2 Best Gaze: 0 3 Visual: 0 4 Facial Palsy: 1 5a Motor Arm - left: 1 5b Motor Arm - Right: 0 6a Motor Leg - Left: 0 6b Motor Leg - Right: 0 7 Limb Ataxia: 2 8 Sensory: 1 9 Best Language: 0 10 Dysarthria:1 11 Extinct. and Inatten.: 0 TOTAL: 6   Ct Head Wo Contrast  10/22/2017 IMPRESSION: No acute finding or explanation for symptoms.   Assessment: 72 y.o. male without any significant past medical history presents to the ED with persistent left upper and lower extremity weakness, numbness tingling,  Left facial weakens, dysarthria and ataxic gait which began the night before at 9 PM and persisted until now with minimal improvement  1. Acute stroke-presenting with Ischemic posterior circulation symptoms of  feeling off balance, unilateral limb weakness, gait and limb ataxia and dysarthria.  CT head did not show any acute findings.  Currently pending MRI of the brain and MRA of the head and neck, for more definitive head imaging results.  We will complete the full stroke workup with echocardiogram to rule out further etiologies.  At this time will start patient on therapeutic dose aspirin 325 mg as well as high-dose statin atorvastatin 80 mg daily.  We will also consult rehab therapy PT, OT, speech therapy.  Stroke Risk Factors - family history and smoking  Plan: - HgbA1c, fasting lipid panel pending - MRI brain MRA  of the head/neck pending - PT consult, OT consult, Speech consult - Echocardiogram - Carotid dopplers - Prophylactic therapy-Antiplatelet med: Aspirin - dose 325 - High dose Statin : Atorvastatin 80mg  - Risk factor modification - Telemetry monitoring -  Frequent neuro checks - Fall precautions   Jacob Moores DNP Neuro-hospitalist Team [phone] 10/22/2017, 3:51 PM

## 2017-10-22 NOTE — ED Triage Notes (Signed)
Pt. Stated, I started having some left side weakness last night and it seems like Im having a little bit of putting words together.  No weakness bilateral, = grips, symmetrical smile. Alert and oriented x 4.

## 2017-10-22 NOTE — ED Provider Notes (Signed)
[name] Provider Note   CSN: [id number] Arrival date & time: 10/22/17  0932     History   Chief Complaint Chief Complaint  Patient presents with  . Weakness    left side    HPI James Pugh is a 72 y.o. male.  The history is provided by the patient.  Weakness  Primary symptoms include focal weakness, loss of sensation, loss of balance, speech change. This is a new problem. The current episode started 12 to 24 hours ago. The problem has not changed since onset.There was left upper extremity and left lower extremity focality noted. There has been no fever. Pertinent negatives include no shortness of breath, no chest pain, no vomiting, no altered mental status, no confusion and no headaches. There were no medications administered prior to arrival. Associated medical issues include trauma. Associated medical issues do not include CVA.    72 year old male with history of hyperlipidemia and prediabetes here with left arm and leg tingling numbness and weakness since about 8 PM last night.  States he is never had these symptoms before.  He is noticed a little bit of difficulty with ambulating feeling he is drifting into the side.  It is not associated with any blurry vision or numbness.  He feels his speech is a little bit difficult in getting the words articulated correctly.  There was a minor head trauma a few days ago when he hit his head on a beam at home.  He states there was no LOC but he did see stars.  He is not complaining of any headache.  Past Medical History:  Diagnosis Date  . CERUMEN IMPACTION 08/22/2009  . HYPERLIPIDEMIA 06/06/2009  . PREDIABETES 07/13/2010    Patient Active Problem List   Diagnosis Date Noted  . PREDIABETES 07/13/2010  . CERUMEN IMPACTION 08/22/2009  . Dyslipidemia 06/06/2009    Past Surgical History:  Procedure Laterality Date  . TONSILLECTOMY         Home Medications    Prior to Admission  medications   Medication Sig Start Date End Date Taking? Authorizing Provider  aspirin 81 MG tablet Take 81 mg by mouth daily.     [provider]    Family History Family History  Problem Relation Age of Onset  . Stroke Father   . Heart disease Father   . Diabetes Father        type ll  . Alcohol abuse Other     Social History Social History   Tobacco Use  . Smoking status: Former Smoker    Packs/day: 1.00    Years: 20.00    Pack years: 20.00    Types: Cigarettes    Last attempt to quit: 08/11/1983    Years since quitting: 34.2  . Smokeless tobacco: Never Used  Substance Use Topics  . Alcohol use: No  . Drug use: No     Allergies   Patient has no known allergies.   Review of Systems Review of Systems  Constitutional: Negative for chills and fever.  HENT: Negative for ear pain and sore throat.   Eyes: Negative for pain and visual disturbance.  Respiratory: Negative for cough and shortness of breath.   Cardiovascular: Negative for chest pain and palpitations.  Gastrointestinal: Negative for abdominal pain and vomiting.  Genitourinary: Negative for dysuria and hematuria.  Musculoskeletal: Negative for arthralgias and back pain.  Skin: Negative for color change and rash.  Neurological: Positive for speech change, focal weakness,  speech difficulty, weakness, numbness and loss of balance. Negative for seizures, syncope and headaches.  Psychiatric/Behavioral: Negative for confusion.  All other systems reviewed and are negative.    Physical Exam Updated Vital Signs BP (!) 141/67 (BP Location: Left Arm)   Pulse (!) 55   Temp 98 F (36.7 C) (Oral)   Resp 17   Ht 5\' 6"  (1.676 m)   Wt 76.2 kg (168 lb)   SpO2 98%   BMI 27.12 kg/m   Physical Exam  Constitutional: He appears well-developed and well-nourished.  HENT:  Head: Normocephalic and atraumatic.  Eyes: Conjunctivae are normal.  Neck: Neck supple.  Cardiovascular: Normal rate and regular  rhythm.  No murmur heard. Pulmonary/Chest: Effort normal and breath sounds normal. No respiratory distress.  Abdominal: Soft. There is no tenderness.  Musculoskeletal: He exhibits no edema.  Neurological: He is alert. He displays no tremor. A cranial nerve deficit (sl assymetry left) and sensory deficit (subjective decrease left arm and leg, nl proprioception) is present. He exhibits normal muscle tone. He displays a negative Romberg sign. GCS eye subscore is 4. GCS verbal subscore is 5. GCS motor subscore is 6.  Strength is 5/5 upper and lower, but patient states feels like it takes more effort on left.   Skin: Skin is warm and dry.  Psychiatric: He has a normal mood and affect.  Nursing note and vitals reviewed.    ED Treatments / Results  Labs (all labs ordered are listed, but only abnormal results are displayed) Labs Reviewed  BASIC METABOLIC PANEL - Abnormal; Notable for the following components:      Result Value   Glucose, Bld 138 (*)    All other components within normal limits  CBC  URINALYSIS, ROUTINE W REFLEX MICROSCOPIC  TROPONIN I  CBG MONITORING, ED    EKG  EKG Interpretation  Date/Time:  Saturday October 22 2017 09:37:04 EST Ventricular Rate:  62 PR Interval:  156 QRS Duration: 82 QT Interval:  388 QTC Calculation: 393 R Axis:   84 Text Interpretation:  Normal sinus rhythm Normal ECG no acute st/ts no prior available Confirmed by Aletta Edouard [phone]) on 10/22/2017 12:01:57 PM       Radiology Ct Head Wo Contrast  Result Date: 10/22/2017 CLINICAL DATA:  Left-sided numbness and tingling since 10 a.m. today. EXAM: CT HEAD WITHOUT CONTRAST TECHNIQUE: Contiguous axial images were obtained from the base of the skull through the vertex without intravenous contrast. COMPARISON:  None. FINDINGS: Brain: No evidence of acute infarction, hemorrhage, hydrocephalus, extra-axial collection or mass lesion/mass effect. Mild patchy is likely chronic small vessel ischemia  low-density in the cerebral white matter that. Normal brain volume. Vascular: Atherosclerotic calcification.  No hyperdense vessel. Skull: Normal. Negative for fracture or focal lesion. Sinuses/Orbits: Bilateral cataract resection.  No acute finding. IMPRESSION: No acute finding or explanation for symptoms. Electronically Signed   By: Monte Fantasia M.D.   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 Dr. Leonie Man is aware and first noted this finding. Electronically Signed   By: Monte Fantasia M.D.   On: 10/23/2017 08:19   Result Date: 10/23/2017 CLINICAL DATA:  Focal neuro deficit for greater than 6 hours, stroke suspected. New onset left upper and lower extremity weakness beginning at 9 p.m. last night. EXAM: MRI HEAD WITHOUT CONTRAST MRA HEAD WITHOUT CONTRAST MRA NECK WITHOUT AND WITH CONTRAST  TECHNIQUE: Multiplanar, multiecho pulse sequences of the brain and surrounding structures were obtained without intravenous contrast. Angiographic images of the Circle of Willis were obtained using MRA technique without intravenous contrast. Angiographic images of the neck were obtained using MRA technique without and with intravenous contrast. Carotid stenosis measurements (when applicable) are obtained utilizing NASCET criteria, using the distal internal carotid diameter as the denominator. 21mL MULTIHANCE GADOBENATE DIMEGLUMINE 529 MG/ML IV SOLN COMPARISON:  CT head without contrast from the same day. FINDINGS: MRI HEAD FINDINGS Brain: Diffusion-weighted images demonstrate no acute or subacute infarction. Scattered periventricular and subcortical T2 hyperintensities bilaterally are mildly advanced for age. No acute hemorrhage or mass lesion is present. Ventricles are of normal size. No significant extra-axial fluid collection is present. The internal auditory canals are within normal limits  bilaterally. The brainstem and cerebellum are normal. Vascular: Flow is present in the major intracranial arteries. Skull and upper cervical spine: The craniocervical junction is normal. Midline sagittal structures are unremarkable. Marrow signal is normal. Sinuses/Orbits: The paranasal sinuses are clear. There is some fluid in left mastoid air cells. No obstructing nasopharyngeal lesion is present. Bilateral lens replacements are present. Globes and orbits are within normal limits. Other: MRA HEAD FINDINGS The time-of-flight images demonstrate a normal appearance of the internal carotid arteries from the high cervical segments through the ICA termini bilaterally. The A1 and M1 segments are normal. The anterior communicating artery is patent. MCA bifurcations are normal. The ACA and MCA branch vessels are intact. The left vertebral artery is slightly dominant to the right. PICA origins are visualized and normal. The vertebrobasilar junction is normal. The basilar artery is normal. Both posterior cerebral arteries originate from basilar tip. There is some attenuation of distal PCA branch vessels bilaterally without a significant proximal stenosis or occlusion. MRA NECK FINDINGS The time-of-flight images demonstrate no significant flow disturbance at either carotid bifurcation. Flow is antegrade in the vertebral arteries bilaterally. The postcontrast images demonstrate a 3 vessel arch configuration. The common carotid arteries are within normal limits bilaterally. The right MCA scratched at the right carotid bifurcation is normal. The right cervical ICA is normal. Atherosclerotic changes are present at the left carotid bifurcation with 50% narrowing relative to the more distal vessel. No tandem stenoses are present. The left vertebral artery is slightly dominant to the right. There is a moderate stenosis of the left vertebral artery origin without other tandem stenosis. There is no significant stenosis at the origin  of the right vertebral artery. IMPRESSION: 1. No acute or focal abnormality to explain the patient's symptoms. No acute or subacute infarct. 2. Atrophy and white matter changes are mildly advanced for age. 3. Normal MRA circle of will is without significant proximal stenosis, aneurysm, or branch vessel occlusion. 4. 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 5. 50% stenosis at the left carotid bifurcation without a significant stenosis of greater than 50% in the anterior circulation on either side. Electronically Signed: By: San Morelle M.D. 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 Dr. Leonie Man is aware and first noted this finding. Electronically Signed   By: Monte Fantasia M.D.   On: 10/23/2017 08:19   Result Date: 10/23/2017 CLINICAL DATA:  Focal neuro deficit for greater than 6 hours, stroke suspected. New onset left upper and lower extremity weakness beginning at 9 p.m. last night. EXAM:  MRI HEAD WITHOUT CONTRAST MRA HEAD WITHOUT CONTRAST MRA NECK WITHOUT AND WITH CONTRAST TECHNIQUE: Multiplanar, multiecho pulse sequences of the brain and surrounding structures were obtained without intravenous contrast. Angiographic images of the Circle of Willis were obtained using MRA technique without intravenous contrast. Angiographic images of the neck were obtained using MRA technique without and with intravenous contrast. Carotid stenosis measurements (when applicable) are obtained utilizing NASCET criteria, using the distal internal carotid diameter as the denominator. 61mL MULTIHANCE GADOBENATE DIMEGLUMINE 529 MG/ML IV SOLN COMPARISON:  CT head without contrast from the same day. FINDINGS: MRI HEAD FINDINGS Brain: Diffusion-weighted images demonstrate no acute or subacute infarction. Scattered periventricular and  subcortical T2 hyperintensities bilaterally are mildly advanced for age. No acute hemorrhage or mass lesion is present. Ventricles are of normal size. No significant extra-axial fluid collection is present. The internal auditory canals are within normal limits bilaterally. The brainstem and cerebellum are normal. Vascular: Flow is present in the major intracranial arteries. Skull and upper cervical spine: The craniocervical junction is normal. Midline sagittal structures are unremarkable. Marrow signal is normal. Sinuses/Orbits: The paranasal sinuses are clear. There is some fluid in left mastoid air cells. No obstructing nasopharyngeal lesion is present. Bilateral lens replacements are present. Globes and orbits are within normal limits. Other: MRA HEAD FINDINGS The time-of-flight images demonstrate a normal appearance of the internal carotid arteries from the high cervical segments through the ICA termini bilaterally. The A1 and M1 segments are normal. The anterior communicating artery is patent. MCA bifurcations are normal. The ACA and MCA branch vessels are intact. The left vertebral artery is slightly dominant to the right. PICA origins are visualized and normal. The vertebrobasilar junction is normal. The basilar artery is normal. Both posterior cerebral arteries originate from basilar tip. There is some attenuation of distal PCA branch vessels bilaterally without a significant proximal stenosis or occlusion. MRA NECK FINDINGS The time-of-flight images demonstrate no significant flow disturbance at either carotid bifurcation. Flow is antegrade in the vertebral arteries bilaterally. The postcontrast images demonstrate a 3 vessel arch configuration. The common carotid arteries are within normal limits bilaterally. The right MCA scratched at the right carotid bifurcation is normal. The right cervical ICA is normal. Atherosclerotic changes are present at the left carotid bifurcation with 50% narrowing relative to  the more distal vessel. No tandem stenoses are present. The left vertebral artery is slightly dominant to the right. There is a moderate stenosis of the left vertebral artery origin without other tandem stenosis. There is no significant stenosis at the origin of the right vertebral artery. IMPRESSION: 1. No acute or focal abnormality to explain the patient's symptoms. No acute or subacute infarct. 2. Atrophy and white matter changes are mildly advanced for age. 3. Normal MRA circle of will is without significant proximal stenosis, aneurysm, or branch vessel occlusion. 4. 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 5. 50% stenosis at the left carotid bifurcation without a significant stenosis of greater than 50% in the anterior circulation on either side. Electronically Signed: By: San Morelle M.D. 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 Dr. Leonie Man is aware and first noted this finding. Electronically Signed   By: Monte Fantasia M.D.   On: 10/23/2017 08:19   Result Date: 10/23/2017 CLINICAL DATA:  Focal neuro deficit for greater than 6 hours, stroke suspected.  New onset left upper and lower extremity weakness beginning at 9 p.m. last night. EXAM: MRI HEAD WITHOUT CONTRAST MRA HEAD WITHOUT CONTRAST MRA NECK WITHOUT AND WITH CONTRAST TECHNIQUE: Multiplanar, multiecho pulse sequences of the brain and surrounding structures were obtained without intravenous contrast. Angiographic images of the Circle of Willis were obtained using MRA technique without intravenous contrast. Angiographic images of the neck were obtained using MRA technique without and with intravenous contrast. Carotid stenosis measurements (when applicable) are obtained utilizing NASCET criteria, using the distal internal carotid diameter as the  denominator. 20mL MULTIHANCE GADOBENATE DIMEGLUMINE 529 MG/ML IV SOLN COMPARISON:  CT head without contrast from the same day. FINDINGS: MRI HEAD FINDINGS Brain: Diffusion-weighted images demonstrate no acute or subacute infarction. Scattered periventricular and subcortical T2 hyperintensities bilaterally are mildly advanced for age. No acute hemorrhage or mass lesion is present. Ventricles are of normal size. No significant extra-axial fluid collection is present. The internal auditory canals are within normal limits bilaterally. The brainstem and cerebellum are normal. Vascular: Flow is present in the major intracranial arteries. Skull and upper cervical spine: The craniocervical junction is normal. Midline sagittal structures are unremarkable. Marrow signal is normal. Sinuses/Orbits: The paranasal sinuses are clear. There is some fluid in left mastoid air cells. No obstructing nasopharyngeal lesion is present. Bilateral lens replacements are present. Globes and orbits are within normal limits. Other: MRA HEAD FINDINGS The time-of-flight images demonstrate a normal appearance of the internal carotid arteries from the high cervical segments through the ICA termini bilaterally. The A1 and M1 segments are normal. The anterior communicating artery is patent. MCA bifurcations are normal. The ACA and MCA branch vessels are intact. The left vertebral artery is slightly dominant to the right. PICA origins are visualized and normal. The vertebrobasilar junction is normal. The basilar artery is normal. Both posterior cerebral arteries originate from basilar tip. There is some attenuation of distal PCA branch vessels bilaterally without a significant proximal stenosis or occlusion. MRA NECK FINDINGS The time-of-flight images demonstrate no significant flow disturbance at either carotid bifurcation. Flow is antegrade in the vertebral arteries bilaterally. The postcontrast images demonstrate a 3 vessel arch configuration. The  common carotid arteries are within normal limits bilaterally. The right MCA scratched at the right carotid bifurcation is normal. The right cervical ICA is normal. Atherosclerotic changes are present at the left carotid bifurcation with 50% narrowing relative to the more distal vessel. No tandem stenoses are present. The left vertebral artery is slightly dominant to the right. There is a moderate stenosis of the left vertebral artery origin without other tandem stenosis. There is no significant stenosis at the origin of the right vertebral artery. IMPRESSION: 1. No acute or focal abnormality to explain the patient's symptoms. No acute or subacute infarct. 2. Atrophy and white matter changes are mildly advanced for age. 3. Normal MRA circle of will is without significant proximal stenosis, aneurysm, or branch vessel occlusion. 4. 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 5. 50% stenosis at the left carotid bifurcation without a significant stenosis of greater than 50% in the anterior circulation on either side. Electronically Signed: By: San Morelle M.D. On: 10/22/2017 17:51    Procedures Procedures (including critical care time)  Medications Ordered in ED Medications - No data to display   Initial Impression / Assessment and Plan / ED Course  I have reviewed the triage vital signs and the nursing notes.  Pertinent labs & imaging results that were available  during my care of the patient were reviewed by me and considered in my medical decision making (see chart for details).  Clinical Course as of Oct 23 1812  Sat Oct 22, 2017  1411 Discussed with Dr. Leonel Ramsay neurology who agrees with current workup and will see the patient in the department.  [MB]  1507 Discussed with the hospitalist accepts patient for admission.  I updated the patient with the plan for an MRI neuro consult and admission to the hospital on they are  in agreement with the plan.  [MB]    Clinical Course User Index [MB] Hayden Rasmussen, MD   72 year old male with no prior stroke history here with acute onset of left arm left leg paresthesias and some subtle weakness.  He symptoms been ongoing since about 8 PM last night.  I have consulted neurology and ordered him a head CT.  Rest of his lab work and EKG are ongoing.  He is outside the window for TPA.  Final Clinical Impressions(s) / ED Diagnoses   Final diagnoses:  Acute CVA (cerebrovascular accident) [facility])    ED Discharge Orders    None       Hayden Rasmussen, MD 10/23/17 [phone]

## 2017-10-22 NOTE — Progress Notes (Signed)
Patient arrived to room 3w09 from ED; oriented to room and unit routine. Family at bedside.

## 2017-10-22 NOTE — ED Notes (Signed)
Patient transported to CT 

## 2017-10-22 NOTE — ED Notes (Signed)
Pt returned from MRI, neuro at bedside.

## 2017-10-22 NOTE — Progress Notes (Signed)
Symptoms are waxing/waning, I suspect stuttering TIA. No evidence of stroke on MRI, but when symptoms are present, consistent with stroke. No LVO, suspect stuttering small vessel infarct. Unclear that he has ever been completely asymptomatic, so I do not think he is a candidate for tPA.   I will load with plavix 300mg  and give 500 cc bolus.   Roland Rack, MD [facility] [phone]  If 7pm- 7am, please page neurology on call as listed in [place].

## 2017-10-22 NOTE — ED Notes (Signed)
Attempted to call report x 1  

## 2017-10-22 NOTE — Progress Notes (Signed)
Patient symptoms waxing/waning since arrival; neurology paged; orders received.

## 2017-10-22 NOTE — H&P (Signed)
History and Physical    James Pugh ZYS:[id number] DOB: January 07, 1946 DOA: 10/22/2017  PCP: [name], MD   Patient coming from: home   Chief Complaint: L sided weakness  HPI: James Pugh is a 73 y.o. male with medical history significant of hyperlipidemia who comes in with left-sided weakness.  In brief yesterday patient noted to have some dysarthria, left-sided weakness including arm and leg along with numbness and tingling.  Patient went to sleep as he was hoping this would improve however this did not improve in the morning.  He is still able to ambulate and walk.  He continues to have numbness and tingling.  He reports his weakness is getting better.  He called his daughter who is a Marine scientist and was brought into EMS.  He denies any chest pain, palpitations, nausea, vomiting, abdominal pain, diarrhea, rash, prior stroke. ED Course: Vitals were stable in the ED.  MRI is pending.  Noncontrasted head CT was negative.  Labs were reassuring.  Patient was admitted for stroke rule out  Review of Systems: As per HPI otherwise 10 point review of systems negative.   Past Medical History:  Diagnosis Date  . CERUMEN IMPACTION 08/22/2009  . HYPERLIPIDEMIA 06/06/2009  . PREDIABETES 07/13/2010    Past Surgical History:  Procedure Laterality Date  . TONSILLECTOMY       reports that he quit smoking about 34 years ago. His smoking use included cigarettes. He has a 20.00 pack-year smoking history. he has never used smokeless tobacco. He reports that he does not drink alcohol or use drugs.  No Known Allergies  Family History  Problem Relation Age of Onset  . Stroke Father   . Heart disease Father   . Diabetes Father        type ll  . Alcohol abuse Other    Unacceptable: Noncontributory, unremarkable, or negative. Acceptable: Family history reviewed and not pertinent (If you reviewed it)  Prior to Admission medications   Medication Sig Start Date End Date Taking? Authorizing  Provider  aspirin 81 MG tablet Take 81 mg by mouth daily.     [provider]    Physical Exam: Vitals:   10/22/17 1300 10/22/17 1330 10/22/17 1348 10/22/17 1400  BP: 135/69 128/70  (!) 149/69  Pulse: (!) 49 (!) 50  (!) 50  Resp: 14 15  18   Temp:   97.9 F (36.6 C)   TempSrc:      SpO2: 98% 99%  97%  Weight:      Height:        Constitutional: NAD, calm, comfortable Vitals:   10/22/17 1300 10/22/17 1330 10/22/17 1348 10/22/17 1400  BP: 135/69 128/70  (!) 149/69  Pulse: (!) 49 (!) 50  (!) 50  Resp: 14 15  18   Temp:   97.9 F (36.6 C)   TempSrc:      SpO2: 98% 99%  97%  Weight:      Height:       Eyes: PERRL, extraocular muscles intact, anicteric sclera ENMT: Moist mucous membranes Neck: normal, supple Respiratory: clear to auscultation bilaterally, no wheezing, no crackles. Normal respiratory effort. No accessory muscle use.  Cardiovascular: R regular rate and rhythm, no carotid bruits Abdomen: no tenderness, no masses palpated. No hepatosplenomegaly. Bowel sounds positive.  Musculoskeletal: Edema Skin: no rashes on visible skin Neurologic: CN 2-12 intact, diminished sensation on left side of face, arm, leg.  Strength 5 out of 5 in upper extremity and lower extremity on  right.  Strength 4+ out of 5 on left upper extremity with flexion of the forearm.  Reflexes 2+ throughout, upgoing toe on left Psychiatric: Normal judgment and insight. Alert and oriented x 3. Normal mood.   Labs on Admission: I have personally reviewed following labs and imaging studies  CBC: Recent Labs  Lab 10/22/17 0943  WBC 6.8  HGB 14.2  HCT 42.5  MCV 87.6  PLT 361   Basic Metabolic Panel: Recent Labs  Lab 10/22/17 0943  NA 137  K 4.2  CL 102  CO2 25  GLUCOSE 138*  BUN 11  CREATININE 0.99  CALCIUM 9.4   GFR: Estimated Creatinine Clearance: 60 mL/min (by C-G formula based on SCr of 0.99 mg/dL). Liver Function Tests: No results for input(s): AST, ALT, ALKPHOS, BILITOT,  PROT, ALBUMIN in the last 168 hours. No results for input(s): LIPASE, AMYLASE in the last 168 hours. No results for input(s): AMMONIA in the last 168 hours. Coagulation Profile: No results for input(s): INR, PROTIME in the last 168 hours. Cardiac Enzymes: Recent Labs  Lab 10/22/17 1000  TROPONINI <0.03   BNP (last 3 results) No results for input(s): PROBNP in the last 8760 hours. HbA1C: No results for input(s): HGBA1C in the last 72 hours. CBG: Recent Labs  Lab 10/22/17 1249  GLUCAP 108*   Lipid Profile: No results for input(s): CHOL, HDL, LDLCALC, TRIG, CHOLHDL, LDLDIRECT in the last 72 hours. Thyroid Function Tests: No results for input(s): TSH, T4TOTAL, FREET4, T3FREE, THYROIDAB in the last 72 hours. Anemia Panel: No results for input(s): VITAMINB12, FOLATE, FERRITIN, TIBC, IRON, RETICCTPCT in the last 72 hours. Urine analysis:    Component Value Date/Time   COLORURINE YELLOW 10/22/2017 0945   APPEARANCEUR HAZY (A) 10/22/2017 0945   LABSPEC 1.018 10/22/2017 0945   PHURINE 6.0 10/22/2017 0945   GLUCOSEU NEGATIVE 10/22/2017 0945   HGBUR NEGATIVE 10/22/2017 0945   HGBUR negative 07/06/2010 0837   BILIRUBINUR NEGATIVE 10/22/2017 0945   BILIRUBINUR negative 11/04/2014 1116   KETONESUR 20 (A) 10/22/2017 0945   PROTEINUR NEGATIVE 10/22/2017 0945   UROBILINOGEN 0.2 11/04/2014 1116   UROBILINOGEN 0.2 07/06/2010 0837   NITRITE NEGATIVE 10/22/2017 0945   LEUKOCYTESUR NEGATIVE 10/22/2017 0945    Radiological Exams on Admission: Ct Head Wo Contrast  Result Date: 10/22/2017 CLINICAL DATA:  Left-sided numbness and tingling since 10 a.m. today. EXAM: CT HEAD WITHOUT CONTRAST TECHNIQUE: Contiguous axial images were obtained from the base of the skull through the vertex without intravenous contrast. COMPARISON:  None. FINDINGS: Brain: No evidence of acute infarction, hemorrhage, hydrocephalus, extra-axial collection or mass lesion/mass effect. Mild patchy is likely chronic small  vessel ischemia low-density in the cerebral white matter that. Normal brain volume. Vascular: Atherosclerotic calcification.  No hyperdense vessel. Skull: Normal. Negative for fracture or focal lesion. Sinuses/Orbits: Bilateral cataract resection.  No acute finding. IMPRESSION: No acute finding or explanation for symptoms. Electronically Signed   By: Monte Fantasia M.D.   On: 10/22/2017 14:23    EKG: Independently reviewed.  No acute ST segment changes  Assessment/Plan Active Problems:   Dyslipidemia   CVA (cerebral vascular accident) ([place])  ##) TIA versus stroke: Symptoms appear to be improving however quite large territory. -Neurology consult -Carotid ultrasound -MRI pending -Lipid and A1c panel ordered -Permissive hypertension -Neurochecks -Maintain on telemetry for possible A. fib -Continue atorvastatin 80 mg -Continue aspirin 325 mg  Fluids: Tolerating p.o., passed bedside speech eval, 2 g sodium restricted diet Electrolytes: Normal Nutrition 2 g sodium restricted diet  Prophylaxis: Enoxaparin  Disposition: Pending neurology input, stroke workup  Full code  Cristy Folks MD [facility]  If 7PM-7AM, please contact night-coverage [email or web address] Password [email or web address]  10/22/2017, 4:09 PM

## 2017-10-22 NOTE — ED Notes (Signed)
Patient transported to MRI 

## 2017-10-23 ENCOUNTER — Observation Stay (HOSPITAL_BASED_OUTPATIENT_CLINIC_OR_DEPARTMENT_OTHER): Payer: Medicare Other

## 2017-10-23 DIAGNOSIS — K5900 Constipation, unspecified: Secondary | ICD-10-CM | POA: Diagnosis present

## 2017-10-23 DIAGNOSIS — Z7982 Long term (current) use of aspirin: Secondary | ICD-10-CM | POA: Diagnosis not present

## 2017-10-23 DIAGNOSIS — E785 Hyperlipidemia, unspecified: Secondary | ICD-10-CM | POA: Diagnosis not present

## 2017-10-23 DIAGNOSIS — I6350 Cerebral infarction due to unspecified occlusion or stenosis of unspecified cerebral artery: Secondary | ICD-10-CM | POA: Diagnosis not present

## 2017-10-23 DIAGNOSIS — I6300 Cerebral infarction due to thrombosis of unspecified precerebral artery: Secondary | ICD-10-CM | POA: Diagnosis not present

## 2017-10-23 DIAGNOSIS — I69354 Hemiplegia and hemiparesis following cerebral infarction affecting left non-dominant side: Secondary | ICD-10-CM | POA: Diagnosis present

## 2017-10-23 DIAGNOSIS — Z6827 Body mass index (BMI) 27.0-27.9, adult: Secondary | ICD-10-CM | POA: Diagnosis not present

## 2017-10-23 DIAGNOSIS — R260 Ataxic gait: Secondary | ICD-10-CM | POA: Diagnosis present

## 2017-10-23 DIAGNOSIS — E663 Overweight: Secondary | ICD-10-CM | POA: Diagnosis present

## 2017-10-23 DIAGNOSIS — I6381 Other cerebral infarction due to occlusion or stenosis of small artery: Secondary | ICD-10-CM | POA: Diagnosis present

## 2017-10-23 DIAGNOSIS — I639 Cerebral infarction, unspecified: Secondary | ICD-10-CM | POA: Diagnosis not present

## 2017-10-23 DIAGNOSIS — R471 Dysarthria and anarthria: Secondary | ICD-10-CM | POA: Diagnosis present

## 2017-10-23 DIAGNOSIS — E46 Unspecified protein-calorie malnutrition: Secondary | ICD-10-CM | POA: Diagnosis not present

## 2017-10-23 DIAGNOSIS — I6302 Cerebral infarction due to thrombosis of basilar artery: Secondary | ICD-10-CM | POA: Diagnosis not present

## 2017-10-23 DIAGNOSIS — I10 Essential (primary) hypertension: Secondary | ICD-10-CM

## 2017-10-23 DIAGNOSIS — I6523 Occlusion and stenosis of bilateral carotid arteries: Secondary | ICD-10-CM | POA: Diagnosis present

## 2017-10-23 DIAGNOSIS — Z823 Family history of stroke: Secondary | ICD-10-CM | POA: Diagnosis not present

## 2017-10-23 DIAGNOSIS — R29706 NIHSS score 6: Secondary | ICD-10-CM | POA: Diagnosis present

## 2017-10-23 DIAGNOSIS — E1142 Type 2 diabetes mellitus with diabetic polyneuropathy: Secondary | ICD-10-CM | POA: Diagnosis present

## 2017-10-23 DIAGNOSIS — I69322 Dysarthria following cerebral infarction: Secondary | ICD-10-CM | POA: Diagnosis not present

## 2017-10-23 DIAGNOSIS — G8194 Hemiplegia, unspecified affecting left nondominant side: Secondary | ICD-10-CM | POA: Diagnosis present

## 2017-10-23 DIAGNOSIS — Z87891 Personal history of nicotine dependence: Secondary | ICD-10-CM | POA: Diagnosis not present

## 2017-10-23 DIAGNOSIS — E119 Type 2 diabetes mellitus without complications: Secondary | ICD-10-CM | POA: Diagnosis not present

## 2017-10-23 DIAGNOSIS — Z9849 Cataract extraction status, unspecified eye: Secondary | ICD-10-CM | POA: Diagnosis not present

## 2017-10-23 DIAGNOSIS — D62 Acute posthemorrhagic anemia: Secondary | ICD-10-CM | POA: Diagnosis present

## 2017-10-23 DIAGNOSIS — I6389 Other cerebral infarction: Secondary | ICD-10-CM | POA: Diagnosis not present

## 2017-10-23 DIAGNOSIS — G4733 Obstructive sleep apnea (adult) (pediatric): Secondary | ICD-10-CM | POA: Diagnosis present

## 2017-10-23 DIAGNOSIS — I6502 Occlusion and stenosis of left vertebral artery: Secondary | ICD-10-CM | POA: Diagnosis present

## 2017-10-23 LAB — ECHOCARDIOGRAM COMPLETE
Height: 66 in
Weight: 2688 [oz_av]

## 2017-10-23 LAB — HEMOGLOBIN A1C
Hgb A1c MFr Bld: 6.9 % — ABNORMAL HIGH (ref 4.8–5.6)
Mean Plasma Glucose: 151.33 mg/dL

## 2017-10-23 LAB — LIPID PANEL
Cholesterol: 196 mg/dL (ref 0–200)
HDL: 28 mg/dL — ABNORMAL LOW (ref >40)
LDL Cholesterol: 142 mg/dL — ABNORMAL HIGH (ref 0–99)
Total CHOL/HDL Ratio: 7 RATIO
Triglycerides: 131 mg/dL (ref <150)
VLDL: 26 mg/dL (ref 0–40)

## 2017-10-23 MED ORDER — ASPIRIN EC 81 MG PO TBEC
81.0000 mg | DELAYED_RELEASE_TABLET | Freq: Every day | ORAL | Status: DC
  Administered 2017-10-24: 81 mg via ORAL
  Filled 2017-10-23: qty 1

## 2017-10-23 MED ORDER — DOCUSATE SODIUM 100 MG PO CAPS
100.0000 mg | ORAL_CAPSULE | Freq: Two times a day (BID) | ORAL | Status: DC
  Administered 2017-10-23 – 2017-10-24 (×3): 100 mg via ORAL
  Filled 2017-10-23 (×3): qty 1

## 2017-10-23 NOTE — Evaluation (Signed)
Clinical/Bedside Swallow Evaluation Patient Details  Name: James Pugh MRN: [id number] Date of Birth: 09/30/1945  Today's Date: 10/23/2017 Time: SLP Start Time (ACUTE ONLY): 1245 SLP Stop Time (ACUTE ONLY): 1610 SLP Time Calculation (min) (ACUTE ONLY): 22 min  Past Medical History:  Past Medical History:  Diagnosis Date  . CERUMEN IMPACTION 08/22/2009  . HYPERLIPIDEMIA 06/06/2009  . PREDIABETES 07/13/2010   Past Surgical History:  Past Surgical History:  Procedure Laterality Date  . TONSILLECTOMY     HPI:  72 y.o. male with medical history significant of hyperlipidemia who comes in with left-sided weakness. 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 Pt passed stroke swallow screen in ED; RN reporting difficulty with liquids   Assessment / Plan / Recommendation Clinical Impression  Patient presents with one instance of strong coughing after ice chip. Aside from this event, there are no overt signs of aspiration with any consistency, including repeated challenging with thin liquids via straw. CN examination is remarkable for mild left facial weakness and slight lingual deviation to left. Per RN and pt, his symptoms and weakness have been waxing and waning. Hyolarygneal elevation feels adequate to palpation at the time of my examination, and swallow appears timely. Pt reports he has been biting his left cheek when chewing, however he reports sensation is symmetrical bilaterally. He is impulsive with liquids, particularly when drinking via straw. His daughter reports pt had coughing episode after drinking iced tea with a straw earlier today, and another with a piece of pineapple. SLP educated re: increased risk for dysphagia with pontine infarct. Encouraged pt to take small, single sips and use a slow rate of intake, and to cease intake if he is coughing or short of breath. Daughter is an Therapist, sports and reports she will  monitor closely for overt signs of aspiration. Continue regular diet with thin liquids; no straws. Recommend meds whole in puree and intermittent supervision as pt will need reminders to use a slow rate of intake. SLP will f/u for tolerance vs need for instrumental assessment.    SLP Visit Diagnosis: Dysphagia, unspecified (R13.10)    Aspiration Risk  Mild aspiration risk    Diet Recommendation Regular;Thin liquid   Liquid Administration via: Cup;No straw Medication Administration: Whole meds with puree Supervision: Intermittent supervision to cue for compensatory strategies Compensations: Slow rate;Small sips/bites;Minimize environmental distractions    Other  Recommendations Oral Care Recommendations: Oral care BID   Follow up Recommendations Other (comment)(tba)      Frequency and Duration min 2x/week  1 week       Prognosis Prognosis for Safe Diet Advancement: Good      Swallow Study   General Date of Onset: 10/22/17 HPI: 72 y.o. male with medical history significant of hyperlipidemia who comes in with left-sided weakness. 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 Pt passed stroke swallow screen in ED; RN reporting difficulty with liquids Type of Study: Bedside Swallow Evaluation Previous Swallow Assessment: passed stroke swallow screen in ED Diet Prior to this Study: Regular;Thin liquids Temperature Spikes Noted: No Respiratory Status: Room air History of Recent Intubation: No Behavior/Cognition: Alert;Cooperative;Impulsive Oral Cavity Assessment: Within Functional Limits Oral Care Completed by SLP: No Oral Cavity - Dentition: Adequate natural dentition Vision: Functional for self-feeding Self-Feeding Abilities: Able to feed self Patient Positioning: Upright in bed Baseline Vocal Quality: Normal Volitional Cough: Strong Volitional Swallow: Able to elicit    Oral/Motor/Sensory  Function  Overall Oral Motor/Sensory Function: Mild impairment Facial ROM: Reduced left;Suspected CN VII (facial) dysfunction Facial Symmetry: Abnormal symmetry left;Suspected CN VII (facial) dysfunction Facial Strength: Reduced left;Suspected CN VII (facial) dysfunction Facial Sensation: Within Functional Limits Lingual Symmetry: Abnormal symmetry left(minimal) Lingual Strength: Within Functional Limits Lingual Sensation: Within Functional Limits Velum: Within Functional Limits Mandible: Within Functional Limits   Ice Chips Ice chips: Impaired Presentation: Spoon Pharyngeal Phase Impairments: Cough - Immediate   Thin Liquid Thin Liquid: Within functional limits Presentation: Cup;Straw;Self Fed    Nectar Thick Nectar Thick Liquid: Not tested   Honey Thick Honey Thick Liquid: Not tested   Puree Puree: Within functional limits Presentation: Spoon;Self Fed   Solid   GO   Solid: Within functional limits Presentation: [place], [place], [employer] Speech-Language Pathologist [phone]  Aliene Altes 10/23/2017,4:17 PM

## 2017-10-23 NOTE — Evaluation (Signed)
Occupational Therapy Evaluation Patient Details Name: James Pugh MRN: [id number] DOB: 14-Oct-1945 Today's Date: 10/23/2017    History of Present Illness 72 y.o. male with medical history significant of hyperlipidemia who comes in with left-sided weakness. No evidence of stroke on MRI. Symptoms are waxing/waning, stuttering TIA suspected.    Clinical Impression   Pt admitted with the above diagnoses and presents with below problem list. Pt will benefit from continued acute OT to address the below listed deficits and maximize independence with basic ADLs prior to d/c to next venue. PTA pt was independent with ADLs and very active. Pt presents with significant L side weakness, also noted R side gaze preference (vs muscle weakness?) impacting functional performance. Pt is currently min A with functional transfers/mobility and min to mod A with UB/LB ADLs. Pt very motivated and given PLOF and good support system feel he would be an great candidate for CIR prior to returning home. OT to follow and update recommendations as appropriate.      Follow Up Recommendations  CIR    Equipment Recommendations  Other (comment)(defer to next venue)    Recommendations for Other Services PT consult;Rehab consult     Precautions / Restrictions Precautions Precautions: Fall Restrictions Weight Bearing Restrictions: No      Mobility Bed Mobility Overal bed mobility: Needs Assistance Bed Mobility: Supine to Sit     Supine to sit: Mod assist;HOB elevated     General bed mobility comments: exited on strong side. assist to powerup   Transfers Overall transfer level: Needs assistance Equipment used: None;Rolling walker (2 wheeled) Transfers: Sit to/from Stand Sit to Stand: Min assist         General transfer comment: more steady with rw. seeks external support. to/from EOB and recliner.     Balance Overall balance assessment: Needs assistance Sitting-balance support: Feet  supported Sitting balance-Leahy Scale: Fair Sitting balance - Comments: sat EOB about 5 minutes   Standing balance support: No upper extremity supported;Bilateral upper extremity supported Standing balance-Leahy Scale: Poor Standing balance comment: fair static, poor dynamic. fatigued easily.                            ADL either performed or assessed with clinical judgement   ADL Overall ADL's : Needs assistance/impaired Eating/Feeding: Set up;Sitting   Grooming: Minimal assistance;Sitting   Upper Body Bathing: Moderate assistance;Sitting   Lower Body Bathing: Moderate assistance;Sit to/from stand   Upper Body Dressing : Moderate assistance;Sitting   Lower Body Dressing: Moderate assistance;Sit to/from stand   Toilet Transfer: Minimal assistance;Ambulation;RW   Toileting- Clothing Manipulation and Hygiene: Minimal assistance;Sit to/from stand   Tub/ Shower Transfer: Walk-in shower;Minimal assistance;Ambulation;3 in 1;Rolling walker   Functional mobility during ADLs: Minimal assistance;Rolling walker General ADL Comments: Pt completed bed mobility, ambulated in the room from bed to chair.      Vision Patient Visual Report: No change from baseline Additional Comments: Right gaze preference vs. muscle weakness?     Perception     Praxis      Pertinent Vitals/Pain Pain Assessment: No/denies pain     Hand Dominance Right   Extremity/Trunk Assessment Upper Extremity Assessment Upper Extremity Assessment: LUE deficits/detail LUE Deficits / Details: grossly 2/5 stronger proximal than distal. Sensation intact with exception of numb/tingling in digits. Thumb opposition impaired.  LUE Coordination: decreased fine motor;decreased gross motor   Lower Extremity Assessment Lower Extremity Assessment: Defer to PT evaluation   Cervical / Trunk Assessment  Cervical / Trunk Assessment: Normal   Communication Communication Communication: Other (comment)(very mild  slurring at times)   Cognition Arousal/Alertness: Awake/alert Behavior During Therapy: WFL for tasks assessed/performed Overall Cognitive Status: Within Functional Limits for tasks assessed                                     General Comments       Exercises     Shoulder Instructions      Home Living Family/patient expects to be discharged to:: Private residence Living Arrangements: Spouse/significant other Available Help at Discharge: Family;Available 24 hours/day Type of Home: House Home Access: Level entry     Home Layout: One level     Bathroom Shower/Tub: Walk-in shower         Home Equipment: Shower seat          Prior Functioning/Environment Level of Independence: Independent        Comments: active         OT Problem List: Decreased strength;Decreased range of motion;Impaired balance (sitting and/or standing);Decreased coordination;Decreased knowledge of use of DME or AE;Decreased knowledge of precautions;Impaired sensation;Impaired tone;Impaired UE functional use      OT Treatment/Interventions: Self-care/ADL training;Therapeutic exercise;Neuromuscular education;DME and/or AE instruction;Therapeutic activities;Patient/family education;Balance training    OT Goals(Current goals can be found in the care plan section) Acute Rehab OT Goals Patient Stated Goal: home, regain full function of L side OT Goal Formulation: With patient/family Time For Goal Achievement: 11/06/17 Potential to Achieve Goals: Good ADL Goals Pt Will Perform Grooming: with modified independence;sitting Pt Will Perform Upper Body Bathing: with modified independence;sitting Pt Will Perform Lower Body Bathing: with modified independence;sit to/from stand Pt Will Perform Upper Body Dressing: with modified independence;sitting Pt Will Perform Lower Body Dressing: with modified independence;sit to/from stand Pt Will Transfer to Toilet: with modified  independence;ambulating Pt Will Perform Toileting - Clothing Manipulation and hygiene: with modified independence;sit to/from stand Pt Will Perform Tub/Shower Transfer: Shower transfer;with modified independence;ambulating;shower seat;rolling walker Pt/caregiver will Perform Home Exercise Program: Increased ROM;Increased strength;Left upper extremity;Independently  OT Frequency: Min 3X/week   Barriers to D/C:            Co-evaluation              AM-PAC PT "6 Clicks" Daily Activity     Outcome Measure Help from another person eating meals?: A Little Help from another person taking care of personal grooming?: A Little Help from another person toileting, which includes using toliet, bedpan, or urinal?: A Lot Help from another person bathing (including washing, rinsing, drying)?: A Lot Help from another person to put on and taking off regular upper body clothing?: A Lot Help from another person to put on and taking off regular lower body clothing?: A Lot 6 Click Score: 14   End of Session Equipment Utilized During Treatment: Rolling walker;Gait belt Nurse Communication: Mobility status  Activity Tolerance: Patient tolerated treatment well Patient left: in chair;with call bell/phone within reach;with family/visitor present  OT Visit Diagnosis: Unsteadiness on feet (R26.81);Hemiplegia and hemiparesis Hemiplegia - Right/Left: Left Hemiplegia - dominant/non-dominant: Non-Dominant                Time: 9983-3825 OT Time Calculation (min): 40 min Charges:  OT General Charges $OT Visit: 1 Visit OT Evaluation $OT Eval Low Complexity: 1 Low OT Treatments $Self Care/Home Management : 8-22 mins G-Codes:       [name] Schimke  H 10/23/2017, 10:10 AM

## 2017-10-23 NOTE — Progress Notes (Signed)
PROGRESS NOTE    LINDWOOD MOGEL  VHQ:[id number] DOB: Jun 14, 1946 DOA: 10/22/2017 PCP: [name], MD   Chief Complaint  Patient presents with  . Weakness    left side    Brief Narrative:  HPI on 10/22/2017 by Dr. Brigid Re Purohit ROLFE HARTSELL is a 72 y.o. male with medical history significant of hyperlipidemia who comes in with left-sided weakness.  In brief yesterday patient noted to have some dysarthria, left-sided weakness including arm and leg along with numbness and tingling.  Patient went to sleep as he was hoping this would improve however this did not improve in the morning.  He is still able to ambulate and walk.  He continues to have numbness and tingling.  He reports his weakness is getting better.  He called his daughter who is a Marine scientist and was brought into EMS.  He denies any chest pain, palpitations, nausea, vomiting, abdominal pain, diarrhea, rash, prior stroke.  Interim history Admitted for left arm and leg weakness.  MRI unremarkable for CVA.  Thought to have stuttering TIA.  Neurology consulted and appreciated.  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  Pending CVA workup and neurology  recommendations  Consultants Neurology  Procedures  None  Antibiotics   Anti-infectives (From admission, onward)   None      Subjective:   Nosson Wender seen and examined today.  Continues to complain of left-sided weakness with numbness and tingling in his fingers.  States this comes and goes.  Feels his weakness is  mildly improving today.  Denies current chest pain, shortness of breath, abdominal pain, nausea or vomiting, diarrhea or constipation, dizziness or headache.  Objective:   Vitals:   10/22/17 2100 10/23/17 0100 10/23/17 0524 10/23/17 1018  BP: (!) 127/58 (!) 146/65 (!) 149/55 (!) 165/67  Pulse: 63 (!) 48 (!) 54 (!) 57  Resp: 18 18 18 18   Temp: 98.4 F (36.9 C) 98 F (36.7 C) 97.7 F (36.5 C) 97.7 F (36.5 C)  TempSrc: Oral Oral Oral Oral  SpO2: 98% 95% 100% 100%  Weight:      Height:        Intake/Output Summary (Last 24 hours) at 10/23/2017 1046 Last data filed at 10/23/2017 0854 Gross per 24 hour  Intake -  Output 1475 ml  Net -1475 ml   Filed Weights   10/22/17 0943  Weight: 76.2 kg (168 lb)    Exam  General: Well developed, well nourished, NAD, appears stated age  HEENT: NCAT, mucous membranes moist.   Cardiovascular: S1 S2 auscultated, no rubs, murmurs or gallops. Regular rate and rhythm.  Respiratory: Clear to auscultation bilaterally with equal chest rise  Abdomen: Soft, nontender, nondistended, + bowel sounds  Extremities: warm dry without cyanosis clubbing or edema  Neuro: AAOx3, left-sided facial weakness with decreased sensation.  4 out  of 5 left upper and lower extremity strength, otherwise nonfocal  Psych: Normal affect and demeanor with intact judgement and insight   Data Reviewed: I have personally reviewed following labs and imaging studies  CBC: Recent Labs  Lab 10/22/17 0943  WBC 6.8  HGB 14.2  HCT 42.5  MCV 87.6  PLT 885   Basic Metabolic Panel: Recent Labs  Lab 10/22/17 0943  NA 137  K 4.2  CL 102   CO2 25  GLUCOSE 138*  BUN 11  CREATININE 0.99  CALCIUM 9.4   GFR: Estimated Creatinine Clearance: 60 mL/min (by C-G formula based on SCr of 0.99 mg/dL). Liver Function Tests: No results for input(s): AST, ALT, ALKPHOS, BILITOT, PROT, ALBUMIN in the last 168 hours. No results for input(s): LIPASE, AMYLASE in the last 168 hours. No results for input(s): AMMONIA in the last 168 hours. Coagulation Profile: No results for input(s): INR, PROTIME in the last 168 hours. Cardiac Enzymes: Recent Labs  Lab 10/22/17 1000  TROPONINI <0.03   BNP (last 3 results) No results for input(s): PROBNP in the last 8760 hours. HbA1C: Recent Labs    10/23/17 0419  HGBA1C 6.9*   CBG: Recent Labs  Lab 10/22/17 1249  GLUCAP 108*   Lipid Profile: Recent Labs    10/23/17 0419  CHOL 196  HDL 28*  LDLCALC 142*  TRIG 131  CHOLHDL 7.0   Thyroid Function Tests: No results for input(s): TSH, T4TOTAL, FREET4, T3FREE, THYROIDAB in the last 72 hours. Anemia Panel: No results for input(s): VITAMINB12, FOLATE, FERRITIN, TIBC, IRON, RETICCTPCT in the last 72 hours. Urine analysis:    Component Value Date/Time   COLORURINE YELLOW 10/22/2017 0945   APPEARANCEUR HAZY (A) 10/22/2017 0945   LABSPEC 1.018 10/22/2017 0945   PHURINE 6.0 10/22/2017 0945   GLUCOSEU NEGATIVE 10/22/2017 0945   HGBUR NEGATIVE 10/22/2017 0945   HGBUR negative 07/06/2010 0837   BILIRUBINUR NEGATIVE 10/22/2017 0945   BILIRUBINUR negative 11/04/2014 1116   KETONESUR 20 (A) 10/22/2017 0945   PROTEINUR NEGATIVE 10/22/2017 0945   UROBILINOGEN 0.2 11/04/2014 1116   UROBILINOGEN 0.2 07/06/2010 0837   NITRITE NEGATIVE 10/22/2017 0945   LEUKOCYTESUR NEGATIVE 10/22/2017 0945   Sepsis Labs: @LABRCNTIP (procalcitonin:4,lacticidven:4)  )No results found for this or any previous visit (from the past 240 hour(s)).    Radiology Studies: Ct Head Wo Contrast  Result Date: 10/22/2017 CLINICAL DATA:  Left-sided numbness and tingling  since 10 a.m. today. EXAM: CT HEAD WITHOUT CONTRAST TECHNIQUE: Contiguous axial images were obtained from the base of the skull through the vertex without intravenous contrast. COMPARISON:  None. FINDINGS: Brain: No evidence of acute infarction, hemorrhage, hydrocephalus, extra-axial collection or mass lesion/mass effect. Mild patchy is likely chronic small vessel ischemia low-density in the cerebral white matter that. Normal brain volume. Vascular: Atherosclerotic calcification.  No hyperdense vessel. Skull: Normal. Negative for fracture or focal lesion. Sinuses/Orbits: Bilateral cataract resection.  No acute finding. IMPRESSION: No acute finding or explanation for symptoms. Electronically Signed   By: Monte Fantasia M.D.   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 Dr. Leonie Man is aware and first noted this finding. Electronically Signed   By: Monte Fantasia M.D.   On: 10/23/2017 08:19   Result Date: 10/23/2017 CLINICAL DATA:  Focal neuro deficit for greater than 6 hours, stroke suspected. New onset left upper and lower extremity weakness beginning  at 9 p.m. last night. EXAM: MRI HEAD WITHOUT CONTRAST MRA HEAD WITHOUT CONTRAST MRA NECK WITHOUT AND WITH CONTRAST TECHNIQUE: Multiplanar, multiecho pulse sequences of the brain and surrounding structures were obtained without intravenous contrast. Angiographic images of the Circle of Willis were obtained using MRA technique without intravenous contrast. Angiographic images of the neck were obtained using MRA technique without and with intravenous contrast. Carotid stenosis measurements (when applicable) are obtained utilizing NASCET criteria, using the distal internal carotid diameter as the denominator. 50mL MULTIHANCE GADOBENATE DIMEGLUMINE 529 MG/ML IV SOLN COMPARISON:  CT head without contrast from the same day. FINDINGS: MRI HEAD  FINDINGS Brain: Diffusion-weighted images demonstrate no acute or subacute infarction. Scattered periventricular and subcortical T2 hyperintensities bilaterally are mildly advanced for age. No acute hemorrhage or mass lesion is present. Ventricles are of normal size. No significant extra-axial fluid collection is present. The internal auditory canals are within normal limits bilaterally. The brainstem and cerebellum are normal. Vascular: Flow is present in the major intracranial arteries. Skull and upper cervical spine: The craniocervical junction is normal. Midline sagittal structures are unremarkable. Marrow signal is normal. Sinuses/Orbits: The paranasal sinuses are clear. There is some fluid in left mastoid air cells. No obstructing nasopharyngeal lesion is present. Bilateral lens replacements are present. Globes and orbits are within normal limits. Other: MRA HEAD FINDINGS The time-of-flight images demonstrate a normal appearance of the internal carotid arteries from the high cervical segments through the ICA termini bilaterally. The A1 and M1 segments are normal. The anterior communicating artery is patent. MCA bifurcations are normal. The ACA and MCA branch vessels are intact. The left vertebral artery is slightly dominant to the right. PICA origins are visualized and normal. The vertebrobasilar junction is normal. The basilar artery is normal. Both posterior cerebral arteries originate from basilar tip. There is some attenuation of distal PCA branch vessels bilaterally without a significant proximal stenosis or occlusion. MRA NECK FINDINGS The time-of-flight images demonstrate no significant flow disturbance at either carotid bifurcation. Flow is antegrade in the vertebral arteries bilaterally. The postcontrast images demonstrate a 3 vessel arch configuration. The common carotid arteries are within normal limits bilaterally. The right MCA scratched at the right carotid bifurcation is normal. The right  cervical ICA is normal. Atherosclerotic changes are present at the left carotid bifurcation with 50% narrowing relative to the more distal vessel. No tandem stenoses are present. The left vertebral artery is slightly dominant to the right. There is a moderate stenosis of the left vertebral artery origin without other tandem stenosis. There is no significant stenosis at the origin of the right vertebral artery. IMPRESSION: 1. No acute or focal abnormality to explain the patient's symptoms. No acute or subacute infarct. 2. Atrophy and white matter changes are mildly advanced for age. 3. Normal MRA circle of will is without significant proximal stenosis, aneurysm, or branch vessel occlusion. 4. 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 5. 50% stenosis at the left carotid bifurcation without a significant stenosis of greater than 50% in the anterior circulation on either side. Electronically Signed: By: San Morelle M.D. 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 Dr. Leonie Man is aware and first noted this finding. Electronically Signed   By: Monte Fantasia M.D.   On: 10/23/2017 08:19   Result Date: 10/23/2017 CLINICAL DATA:  Focal neuro deficit for greater  than 6 hours, stroke suspected. New onset left upper and lower extremity weakness beginning at 9 p.m. last night. EXAM: MRI HEAD WITHOUT CONTRAST MRA HEAD WITHOUT CONTRAST MRA NECK WITHOUT AND WITH CONTRAST TECHNIQUE: Multiplanar, multiecho pulse sequences of the brain and surrounding structures were obtained without intravenous contrast. Angiographic images of the Circle of Willis were obtained using MRA technique without intravenous contrast. Angiographic images of the neck were obtained using MRA technique without and with intravenous contrast. Carotid stenosis  measurements (when applicable) are obtained utilizing NASCET criteria, using the distal internal carotid diameter as the denominator. 22mL MULTIHANCE GADOBENATE DIMEGLUMINE 529 MG/ML IV SOLN COMPARISON:  CT head without contrast from the same day. FINDINGS: MRI HEAD FINDINGS Brain: Diffusion-weighted images demonstrate no acute or subacute infarction. Scattered periventricular and subcortical T2 hyperintensities bilaterally are mildly advanced for age. No acute hemorrhage or mass lesion is present. Ventricles are of normal size. No significant extra-axial fluid collection is present. The internal auditory canals are within normal limits bilaterally. The brainstem and cerebellum are normal. Vascular: Flow is present in the major intracranial arteries. Skull and upper cervical spine: The craniocervical junction is normal. Midline sagittal structures are unremarkable. Marrow signal is normal. Sinuses/Orbits: The paranasal sinuses are clear. There is some fluid in left mastoid air cells. No obstructing nasopharyngeal lesion is present. Bilateral lens replacements are present. Globes and orbits are within normal limits. Other: MRA HEAD FINDINGS The time-of-flight images demonstrate a normal appearance of the internal carotid arteries from the high cervical segments through the ICA termini bilaterally. The A1 and M1 segments are normal. The anterior communicating artery is patent. MCA bifurcations are normal. The ACA and MCA branch vessels are intact. The left vertebral artery is slightly dominant to the right. PICA origins are visualized and normal. The vertebrobasilar junction is normal. The basilar artery is normal. Both posterior cerebral arteries originate from basilar tip. There is some attenuation of distal PCA branch vessels bilaterally without a significant proximal stenosis or occlusion. MRA NECK FINDINGS The time-of-flight images demonstrate no significant flow disturbance at either carotid bifurcation. Flow is  antegrade in the vertebral arteries bilaterally. The postcontrast images demonstrate a 3 vessel arch configuration. The common carotid arteries are within normal limits bilaterally. The right MCA scratched at the right carotid bifurcation is normal. The right cervical ICA is normal. Atherosclerotic changes are present at the left carotid bifurcation with 50% narrowing relative to the more distal vessel. No tandem stenoses are present. The left vertebral artery is slightly dominant to the right. There is a moderate stenosis of the left vertebral artery origin without other tandem stenosis. There is no significant stenosis at the origin of the right vertebral artery. IMPRESSION: 1. No acute or focal abnormality to explain the patient's symptoms. No acute or subacute infarct. 2. Atrophy and white matter changes are mildly advanced for age. 3. Normal MRA circle of will is without significant proximal stenosis, aneurysm, or branch vessel occlusion. 4. 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 5. 50% stenosis at the left carotid bifurcation without a significant stenosis of greater than 50% in the anterior circulation on either side. Electronically Signed: By: San Morelle M.D. 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 Dr. Leonie Man is aware and first noted this finding. Electronically Signed   By: Monte Fantasia M.D.   On:  10/23/2017 08:19   Result Date: 10/23/2017 CLINICAL DATA:  Focal neuro deficit for greater than 6 hours, stroke suspected. New onset left upper and lower extremity weakness beginning at 9 p.m. last night. EXAM: MRI HEAD WITHOUT CONTRAST MRA HEAD WITHOUT CONTRAST MRA NECK WITHOUT AND WITH CONTRAST TECHNIQUE: Multiplanar, multiecho pulse sequences of the brain and surrounding structures were  obtained without intravenous contrast. Angiographic images of the Circle of Willis were obtained using MRA technique without intravenous contrast. Angiographic images of the neck were obtained using MRA technique without and with intravenous contrast. Carotid stenosis measurements (when applicable) are obtained utilizing NASCET criteria, using the distal internal carotid diameter as the denominator. 22mL MULTIHANCE GADOBENATE DIMEGLUMINE 529 MG/ML IV SOLN COMPARISON:  CT head without contrast from the same day. FINDINGS: MRI HEAD FINDINGS Brain: Diffusion-weighted images demonstrate no acute or subacute infarction. Scattered periventricular and subcortical T2 hyperintensities bilaterally are mildly advanced for age. No acute hemorrhage or mass lesion is present. Ventricles are of normal size. No significant extra-axial fluid collection is present. The internal auditory canals are within normal limits bilaterally. The brainstem and cerebellum are normal. Vascular: Flow is present in the major intracranial arteries. Skull and upper cervical spine: The craniocervical junction is normal. Midline sagittal structures are unremarkable. Marrow signal is normal. Sinuses/Orbits: The paranasal sinuses are clear. There is some fluid in left mastoid air cells. No obstructing nasopharyngeal lesion is present. Bilateral lens replacements are present. Globes and orbits are within normal limits. Other: MRA HEAD FINDINGS The time-of-flight images demonstrate a normal appearance of the internal carotid arteries from the high cervical segments through the ICA termini bilaterally. The A1 and M1 segments are normal. The anterior communicating artery is patent. MCA bifurcations are normal. The ACA and MCA branch vessels are intact. The left vertebral artery is slightly dominant to the right. PICA origins are visualized and normal. The vertebrobasilar junction is normal. The basilar artery is normal. Both posterior cerebral arteries  originate from basilar tip. There is some attenuation of distal PCA branch vessels bilaterally without a significant proximal stenosis or occlusion. MRA NECK FINDINGS The time-of-flight images demonstrate no significant flow disturbance at either carotid bifurcation. Flow is antegrade in the vertebral arteries bilaterally. The postcontrast images demonstrate a 3 vessel arch configuration. The common carotid arteries are within normal limits bilaterally. The right MCA scratched at the right carotid bifurcation is normal. The right cervical ICA is normal. Atherosclerotic changes are present at the left carotid bifurcation with 50% narrowing relative to the more distal vessel. No tandem stenoses are present. The left vertebral artery is slightly dominant to the right. There is a moderate stenosis of the left vertebral artery origin without other tandem stenosis. There is no significant stenosis at the origin of the right vertebral artery. IMPRESSION: 1. No acute or focal abnormality to explain the patient's symptoms. No acute or subacute infarct. 2. Atrophy and white matter changes are mildly advanced for age. 3. Normal MRA circle of will is without significant proximal stenosis, aneurysm, or branch vessel occlusion. 4. 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 5. 50% stenosis at the left carotid bifurcation without a significant stenosis of greater than 50% in the anterior circulation on either side. Electronically Signed: By: San Morelle M.D. On: 10/22/2017 17:51     Scheduled Meds: . aspirin  325 mg Oral Daily  . atorvastatin  80 mg Oral q1800  . clopidogrel  75 mg Oral Daily  . enoxaparin (  LOVENOX) injection  40 mg Subcutaneous Q24H   Continuous Infusions: . sodium chloride 100 mL/hr at 10/23/17 0413     LOS: 0 days   Time Spent in minutes   30 minutes  Kebin Maye D.O. on 10/23/2017 at 10:46 AM  Between 7am to 7pm  - Pager - [phone]  After 7pm go to [email or web address] - password [email or web address]  And look for the night coverage person covering for me after hours  [facility] Office  [phone]

## 2017-10-23 NOTE — Evaluation (Signed)
Physical Therapy Evaluation Patient Details Name: James Pugh MRN: [id number] DOB: 06-17-1946 Today's Date: 10/23/2017   History of Present Illness  72 y.o. male with medical history significant of hyperlipidemia who comes in with left-sided weakness. No evidence of stroke on MRI. Symptoms are waxing/waning, stuttering TIA suspected.     Clinical Impression  Pt admitted with above diagnosis. Pt currently with functional limitations due to the deficits listed below (see PT Problem List). PTA, pt very active and independent. On eval, pt required mod assist bed mobility, min assist sit to stand and min assist ambulation 25 feet with RW.  He presents with L side weakness and balance deficits. Right gaze preference noted as well as mildly slurred speech.  He is very motivated to participate in therapy.  Pt will benefit from skilled PT to increase their independence and safety with mobility to allow discharge to the venue listed below.       Follow Up Recommendations CIR    Equipment Recommendations  Other (comment)(TBD)    Recommendations for Other Services Rehab consult     Precautions / Restrictions Precautions Precautions: Fall      Mobility  Bed Mobility Overal bed mobility: Needs Assistance Bed Mobility: Supine to Sit     Supine to sit: Mod assist;HOB elevated     General bed mobility comments: assist to elevate trunk  Transfers Overall transfer level: Needs assistance Equipment used: Rolling walker (2 wheeled)   Sit to Stand: Min assist         General transfer comment: verbal cues for hand placement, assist to power up  Ambulation/Gait Ambulation/Gait assistance: Min assist Ambulation Distance (Feet): 25 Feet Assistive device: Rolling walker (2 wheeled) Gait Pattern/deviations: Step-through pattern;Decreased stride length Gait velocity: decreased Gait velocity interpretation: Below normal speed for age/gender General Gait Details: mild steppage gait LLE,  assist to maintain grip on RW with L hand  Stairs            Wheelchair Mobility    Modified Rankin (Stroke Patients Only) Modified Rankin (Stroke Patients Only) Pre-Morbid Rankin Score: No symptoms Modified Rankin: Moderate disability     Balance Overall balance assessment: Needs assistance Sitting-balance support: No upper extremity supported;Feet supported Sitting balance-Leahy Scale: Good     Standing balance support: Bilateral upper extremity supported;During functional activity Standing balance-Leahy Scale: Poor Standing balance comment: reliant on RW                             Pertinent Vitals/Pain Pain Assessment: No/denies pain    Home Living Family/patient expects to be discharged to:: Private residence Living Arrangements: Spouse/significant other Available Help at Discharge: Family;Available 24 hours/day Type of Home: House Home Access: Level entry     Home Layout: One level Home Equipment: Shower seat      Prior Function Level of Independence: Independent         Comments: active      Hand Dominance   Dominant Hand: Right    Extremity/Trunk Assessment   Upper Extremity Assessment Upper Extremity Assessment: Defer to OT evaluation    Lower Extremity Assessment Lower Extremity Assessment: LLE deficits/detail LLE Deficits / Details: c/o tingling L foot; 4-/5 flexors grossly graded LLE Sensation: decreased proprioception    Cervical / Trunk Assessment Cervical / Trunk Assessment: Normal  Communication   Communication: Other (comment)(mild slurred speech)  Cognition Arousal/Alertness: Awake/alert Behavior During Therapy: WFL for tasks assessed/performed Overall Cognitive Status: Within Functional Limits for  tasks assessed                                        General Comments      Exercises     Assessment/Plan    PT Assessment Patient needs continued PT services  PT Problem List Decreased  strength;Decreased mobility;Decreased coordination;Decreased knowledge of precautions;Decreased activity tolerance;Decreased balance;Decreased knowledge of use of DME       PT Treatment Interventions DME instruction;Therapeutic activities;Gait training;Therapeutic exercise;Patient/family education;Balance training;Stair training;Functional mobility training;Neuromuscular re-education    PT Goals (Current goals can be found in the Care Plan section)  Acute Rehab PT Goals Patient Stated Goal: home, independence PT Goal Formulation: With patient/family Time For Goal Achievement: 11/06/17 Potential to Achieve Goals: Good    Frequency Min 4X/week   Barriers to discharge        Co-evaluation               AM-PAC PT "6 Clicks" Daily Activity  Outcome Measure Difficulty turning over in bed (including adjusting bedclothes, sheets and blankets)?: A Little Difficulty moving from lying on back to sitting on the side of the bed? : Unable Difficulty sitting down on and standing up from a chair with arms (e.g., wheelchair, bedside commode, etc,.)?: Unable Help needed moving to and from a bed to chair (including a wheelchair)?: A Little Help needed walking in hospital room?: A Little Help needed climbing 3-5 steps with a railing? : A Lot 6 Click Score: 13    End of Session Equipment Utilized During Treatment: Gait belt Activity Tolerance: Patient tolerated treatment well Patient left: in chair;with call bell/phone within reach;with family/visitor present;with nursing/sitter in room Nurse Communication: Mobility status PT Visit Diagnosis: Other abnormalities of gait and mobility (R26.89)    Time: 7106-2694 PT Time Calculation (min) (ACUTE ONLY): 14 min   Charges:   PT Evaluation $PT Eval Moderate Complexity: 1 Mod     PT G Codes:        James Pugh, PT  Office # [phone] Pager [phone]   [name] 10/23/2017, 2:20 PM

## 2017-10-23 NOTE — Progress Notes (Signed)
*  PRELIMINARY RESULTS* Echocardiogram 2D Echocardiogram has been performed.  Leavy Cella 10/23/2017, 1:07 PM

## 2017-10-23 NOTE — Progress Notes (Signed)
STROKE TEAM PROGRESS NOTE   HISTORY OF PRESENT ILLNESS (per record)  James Pugh is an 72 y.o. male without any significant past medical history presents to the ED with persistent left upper and lower extremity weakness, numbness tingling and ataxic gait which began the night before at 9 PM.  Patient states last night around 9 PM he noted intermittent jerking of his left arm it would last for 30 seconds and continued until almost 4 AM this morning.  Also that time, he noted he was leaning more to his left side with left upper extremity and left lower extremity weakness-feelings of  loss of sensation, numbness and tingling in the left upper and left lower extremity and left face as well as unsteady gait.  He noted his speech had been a little bit slurred but he went to bed and woke up this morning without any improvement in his symptoms.  He called his daughter who was able to confirm that patient did have slurred speech with word finding difficulties.  Admits to feeling off balanced yesterday through to today, denies associated headache, dizziness, visual changes-he had recent cataract surgery, nausea or vomiting.  Patient denies chest pain, shortness of breath recent fevers, chills or  illnesses but admits to being under immense stress.    Patient was brought to the ER by his daughter, who describes patient with unsteady gait as he walked into the ER. Initial Noncon CT of the head did not show any acute abnormalities.  Due to patient presenting with posterior ischemic stroke symptoms, neuro was consulted.  She denies a personal history of stroke but admits to history of stroke in his brother, as well as over 38-year 1 pack/day smoking history.  Of note about 4 days ago, patient hit his head on a beam in his crawl space.  He said he hit the top of his head, without any resultant loss of consciousness but he did see stars and denied any confusion.  Patient's vitals and labs have remained stable while in the  ER.  Dr Leonel Ramsay: On assessment , patient is awake alert and oriented to self place and time with normal comprehension, and repetition and recall. Family states slurred speech has been intermittent and it was evident during assessment.  I have seen the patient reviewed the note. 72 year old male with acute onset left-sided weakness, numbness, ataxia I suspect a small brainstem stroke and he will need to be admitted for secondary risk factor reduction and physical therapy.  Recommendations as described by Jacob Moores.  LSN: 10/21/17 9pm tPA Given: No: out of the time frame window  Premorbid modified Rankin scale (mRS): 0   SUBJECTIVE (INTERVAL HISTORY) The patient's wife and daughter were at the bedside. They reported that the patient's symptoms wax and wane throughout the evening. He was loaded with Plavix and given normal saline. His deficits appear to be improved today; although, they have not resolved completely. Dr. Leonie Man felt that the patient would need inpatient rehabilitation. The patient was quite active prior to admission.    OBJECTIVE Temp:  [97.7 F (36.5 C)-98.9 F (37.2 C)] 97.7 F (36.5 C) (02/10 0524) Pulse Rate:  [48-89] 54 (02/10 0524) Cardiac Rhythm: Normal sinus rhythm (02/09 1902) Resp:  [14-18] 18 (02/10 0524) BP: (124-152)/(55-80) 149/55 (02/10 0524) SpO2:  [95 %-100 %] 100 % (02/10 0524) Weight:  [168 lb (76.2 kg)] 168 lb (76.2 kg) (02/09 0943)  CBC:  Recent Labs  Lab 10/22/17 0943  WBC 6.8  HGB 14.2  HCT 42.5  MCV 87.6  PLT 174    Basic Metabolic Panel:  Recent Labs  Lab 10/22/17 0943  NA 137  K 4.2  CL 102  CO2 25  GLUCOSE 138*  BUN 11  CREATININE 0.99  CALCIUM 9.4    Lipid Panel:     Component Value Date/Time   CHOL 196 10/23/2017 0419   TRIG 131 10/23/2017 0419   HDL 28 (L) 10/23/2017 0419   CHOLHDL 7.0 10/23/2017 0419   VLDL 26 10/23/2017 0419   LDLCALC 142 (H) 10/23/2017 0419   HgbA1c:  Lab Results  Component  Value Date   HGBA1C 6.9 (H) 10/23/2017   Urine Drug Screen: No results found for: LABOPIA, COCAINSCRNUR, LABBENZ, AMPHETMU, THCU, LABBARB  Alcohol Level No results found for: ETH  IMAGING   Ct Head Wo Contrast 10/22/2017 IMPRESSION:  No acute finding or explanation for symptoms.     Mr Jodene Nam Neck W Wo Contrast Mr Jodene Nam Head Wo Contrast 10/22/2017 IMPRESSION:  1. No acute or focal abnormality to explain the patient's symptoms. No acute or subacute infarct.  2. Atrophy and white matter changes are mildly advanced for age.  3. Normal MRA circle of will is without significant proximal stenosis, aneurysm, or branch vessel occlusion.  4. 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  5. 50% stenosis at the left carotid bifurcation without a significant stenosis of greater than 50% in the anterior circulation on either side.    ADDENDUM REPORT: 10/23/2017 08:19 ADDENDUM: Acute perforator infarct in the lower right pons. Dr. Leonie Man is aware and first noted this finding.    Transthoracic Echocardiogram - pending 00/00/00    PHYSICAL EXAM Vitals:   10/22/17 1902 10/22/17 2100 10/23/17 0100 10/23/17 0524  BP: 124/80 (!) 127/58 (!) 146/65 (!) 149/55  Pulse: 89 63 (!) 48 (!) 54  Resp: 16 18 18 18   Temp: 98.9 F (37.2 C) 98.4 F (36.9 C) 98 F (36.7 C) 97.7 F (36.5 C)  TempSrc: Oral Oral Oral Oral  SpO2: 100% 98% 95% 100%  Weight:      Height:       Pleasant elderly Caucasian male currently not in distress. . Afebrile. Head is nontraumatic. Neck is supple without bruit.    Cardiac exam no murmur or gallop. Lungs are clear to auscultation. Distal pulses are well felt. Neurological Exam :  Awake alert oriented x 3 normal speech and language. Mild left lower face asymmetry. Tongue midline. Mild LUE and LLE drift. Left hemiplegia 4/5 Mild diminished fine finger movements on left. Orbits right over left upper extremity.  Mild left grip weak.. Normal sensation . Normal coordination. Gait not tested    ASSESSMENT/PLAN James Pugh is a 72 y.o. male with a remote history of tobacco use, prediabetes and hyperlipidemia presenting with left-sided weakness and ataxia. He did not receive IV t-PA due to late presentation.  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  Resultant  left hemiplegia  CT head - no acute findings  MRI head - acute perforator infarct of the right lower pons initially missed on MRI.  MRA head - 50% bilateral carotid artery stenosis  Carotid Doppler - MRA neck  2D Echo - pending  LDL - 142  HgbA1c - 6.9 VTE prophylaxis - Lovenox  Fall precautions Diet 2 gram sodium Room service appropriate? Yes; Fluid consistency: Thin Fall precautions  aspirin 81 mg daily prior to  admission, now on aspirin 325 mg daily and clopidogrel 75 mg daily  Patient counseled to be compliant with his antithrombotic medications  Ongoing aggressive stroke risk factor management  Therapy recommendations:  pending  Disposition:  Pending  Hypertension  Stable  Permissive hypertension (OK if < 220/120) but gradually normalize in 5-7 days  Long-term BP goal normotensive  Hyperlipidemia  Home meds:  No lipid lowering medications prior to admission  LDL 142, goal < 70  Now on Lipitor 80 mg daily  Continue statin at discharge  Diabetes (history of prediabetes)  HgbA1c 6.9, goal < 7.0  Controlled  Other Stroke Risk Factors  Advanced age  Former cigarette smoker - quit more than 30 years ago.  Overweight, Body mass index is 27.12 kg/m., recommend weight loss, diet and exercise as appropriate   Family hx stroke (father)   Other Active Problems  Prediabetes   Plan / Recommendations   Stroke workup - transthoracic echocardiogram pending  Consult rehabilitation M.D. - done  Consult diabetic teaching nurse - done  Antiplatelet  therapy - aspirin 81 mg daily with Plavix 75 mg daily  3 weeks then Plavix alone  Follow-up Dr. Leonie Man in 6 weeks  IV NS @ 100 cc/hr - could probably decrease rate later today if deficits remain stable.   Hospital day # 0  Mikey Bussing PA-C [facility] Pager [phone] 10/23/2017, 11:39 AM I have personally examined this patient, reviewed notes, independently viewed imaging studies, participated in medical decision making and plan of care.ROS completed by me personally and pertinent positives fully documented  I have made any additions or clarifications directly to the above note. Agree with note above.  He presented with left-sided numbness and ataxia due to small right pontine lacunar infarct from small vessel disease. Recommend dual antiplatelet therapy of aspirin 81 and Plavix 75 mg daily for 3 weeks followed by Plavix alone. Continue ongoing stroke workup and aggressive risk factor modification. Long discussion with the patient and wife at the bedside and answered questions. Greater than 50% time during this 35 minute visit was spent on counseling and coordination of care about his lacunar infarct and answering questions  Antony Contras, [place] Pager: [phone] 10/23/2017 11:47 AM   To contact Stroke Continuity provider, please refer to [email or web address] After hours, contact General Neurology

## 2017-10-24 ENCOUNTER — Other Ambulatory Visit: Payer: Self-pay | Admitting: Neurology

## 2017-10-24 ENCOUNTER — Encounter (HOSPITAL_COMMUNITY): Payer: Self-pay | Admitting: *Deleted

## 2017-10-24 ENCOUNTER — Inpatient Hospital Stay (HOSPITAL_COMMUNITY)
Admission: RE | Admit: 2017-10-24 | Discharge: 2017-10-31 | DRG: 057 | Disposition: A | Discharge status: Home / Self Care | Payer: Medicare Other | Source: Intra-hospital | Attending: Physical Medicine & Rehabilitation | Admitting: Physical Medicine & Rehabilitation

## 2017-10-24 DIAGNOSIS — E1142 Type 2 diabetes mellitus with diabetic polyneuropathy: Secondary | ICD-10-CM | POA: Diagnosis present

## 2017-10-24 DIAGNOSIS — I6389 Other cerebral infarction: Secondary | ICD-10-CM | POA: Diagnosis not present

## 2017-10-24 DIAGNOSIS — Z7982 Long term (current) use of aspirin: Secondary | ICD-10-CM | POA: Diagnosis not present

## 2017-10-24 DIAGNOSIS — Z87891 Personal history of nicotine dependence: Secondary | ICD-10-CM | POA: Diagnosis not present

## 2017-10-24 DIAGNOSIS — I639 Cerebral infarction, unspecified: Secondary | ICD-10-CM

## 2017-10-24 DIAGNOSIS — I6350 Cerebral infarction due to unspecified occlusion or stenosis of unspecified cerebral artery: Secondary | ICD-10-CM | POA: Diagnosis not present

## 2017-10-24 DIAGNOSIS — I10 Essential (primary) hypertension: Secondary | ICD-10-CM

## 2017-10-24 DIAGNOSIS — K5900 Constipation, unspecified: Secondary | ICD-10-CM | POA: Diagnosis present

## 2017-10-24 DIAGNOSIS — Z823 Family history of stroke: Secondary | ICD-10-CM

## 2017-10-24 DIAGNOSIS — D62 Acute posthemorrhagic anemia: Secondary | ICD-10-CM | POA: Diagnosis not present

## 2017-10-24 DIAGNOSIS — G4733 Obstructive sleep apnea (adult) (pediatric): Secondary | ICD-10-CM

## 2017-10-24 DIAGNOSIS — E785 Hyperlipidemia, unspecified: Secondary | ICD-10-CM | POA: Diagnosis present

## 2017-10-24 DIAGNOSIS — E8809 Other disorders of plasma-protein metabolism, not elsewhere classified: Secondary | ICD-10-CM

## 2017-10-24 DIAGNOSIS — Z9849 Cataract extraction status, unspecified eye: Secondary | ICD-10-CM

## 2017-10-24 DIAGNOSIS — E46 Unspecified protein-calorie malnutrition: Secondary | ICD-10-CM

## 2017-10-24 DIAGNOSIS — E119 Type 2 diabetes mellitus without complications: Secondary | ICD-10-CM | POA: Diagnosis not present

## 2017-10-24 DIAGNOSIS — I69354 Hemiplegia and hemiparesis following cerebral infarction affecting left non-dominant side: Principal | ICD-10-CM

## 2017-10-24 DIAGNOSIS — I69322 Dysarthria following cerebral infarction: Secondary | ICD-10-CM

## 2017-10-24 DIAGNOSIS — I6302 Cerebral infarction due to thrombosis of basilar artery: Secondary | ICD-10-CM

## 2017-10-24 LAB — CBC WITH DIFFERENTIAL/PLATELET
BASOS ABS: 0 10*3/uL (ref 0.0–0.1)
BASOS PCT: 0 %
Eosinophils Absolute: 0.3 10*3/uL (ref 0.0–0.7)
Eosinophils Relative: 4 %
HEMATOCRIT: 37.5 % — AB (ref 39.0–52.0)
HEMOGLOBIN: 12.6 g/dL — AB (ref 13.0–17.0)
LYMPHS PCT: 30 %
Lymphs Abs: 2.3 10*3/uL (ref 0.7–4.0)
MCH: 29.4 pg (ref 26.0–34.0)
MCHC: 33.6 g/dL (ref 30.0–36.0)
MCV: 87.4 fL (ref 78.0–100.0)
Monocytes Absolute: 0.7 10*3/uL (ref 0.1–1.0)
Monocytes Relative: 9 %
NEUTROS ABS: 4.3 10*3/uL (ref 1.7–7.7)
NEUTROS PCT: 57 %
Platelets: 296 10*3/uL (ref 150–400)
RBC: 4.29 MIL/uL (ref 4.22–5.81)
RDW: 14.2 % (ref 11.5–15.5)
WBC: 7.7 10*3/uL (ref 4.0–10.5)

## 2017-10-24 LAB — CREATININE, SERUM: Creatinine, Ser: 1.03 mg/dL (ref 0.61–1.24)

## 2017-10-24 MED ORDER — SENNOSIDES-DOCUSATE SODIUM 8.6-50 MG PO TABS: 1.0000 | ORAL_TABLET | Freq: Every evening | ORAL | Status: DC | PRN

## 2017-10-24 MED ORDER — ASPIRIN EC 81 MG PO TBEC
81.0000 mg | DELAYED_RELEASE_TABLET | Freq: Every day | ORAL | Status: DC
  Administered 2017-10-25 – 2017-10-31 (×7): 81 mg via ORAL
  Filled 2017-10-24 (×7): qty 1

## 2017-10-24 MED ORDER — ATORVASTATIN CALCIUM 80 MG PO TABS
80.0000 mg | ORAL_TABLET | Freq: Every day | ORAL | Status: DC
  Administered 2017-10-25 – 2017-10-30 (×6): 80 mg via ORAL
  Filled 2017-10-24 (×6): qty 1

## 2017-10-24 MED ORDER — ACETAMINOPHEN 650 MG RE SUPP: 650.0000 mg | RECTAL | Status: DC | PRN

## 2017-10-24 MED ORDER — CLOPIDOGREL BISULFATE 75 MG PO TABS: 75.0000 mg | ORAL_TABLET | Freq: Every day | ORAL | 0 refills | Status: DC

## 2017-10-24 MED ORDER — ATORVASTATIN CALCIUM 80 MG PO TABS: 80.0000 mg | ORAL_TABLET | Freq: Every day | ORAL | 0 refills | Status: DC

## 2017-10-24 MED ORDER — LIVING WELL WITH DIABETES BOOK
Freq: Once | Status: AC
  Administered 2017-10-24: 22:00:00
  Filled 2017-10-24: qty 1

## 2017-10-24 MED ORDER — ACETAMINOPHEN 325 MG PO TABS: 650.0000 mg | ORAL_TABLET | ORAL | Status: DC | PRN

## 2017-10-24 MED ORDER — ENOXAPARIN SODIUM 40 MG/0.4ML ~~LOC~~ SOLN
40.0000 mg | SUBCUTANEOUS | Status: DC
  Administered 2017-10-24 – 2017-10-30 (×7): 40 mg via SUBCUTANEOUS
  Filled 2017-10-24 (×7): qty 0.4

## 2017-10-24 MED ORDER — SORBITOL 70 % SOLN: 30.0000 mL | Freq: Every day | Status: DC | PRN

## 2017-10-24 MED ORDER — DOCUSATE SODIUM 100 MG PO CAPS
100.0000 mg | ORAL_CAPSULE | Freq: Two times a day (BID) | ORAL | Status: DC
  Administered 2017-10-24 – 2017-10-31 (×14): 100 mg via ORAL
  Filled 2017-10-24 (×14): qty 1

## 2017-10-24 MED ORDER — CLOPIDOGREL BISULFATE 75 MG PO TABS
75.0000 mg | ORAL_TABLET | Freq: Every day | ORAL | Status: DC
  Administered 2017-10-25 – 2017-10-31 (×7): 75 mg via ORAL
  Filled 2017-10-24 (×7): qty 1

## 2017-10-24 MED ORDER — ACETAMINOPHEN 160 MG/5ML PO SOLN: 650.0000 mg | ORAL | Status: DC | PRN

## 2017-10-24 MED ORDER — ONDANSETRON HCL 4 MG/2ML IJ SOLN: 4.0000 mg | Freq: Four times a day (QID) | INTRAMUSCULAR | Status: DC | PRN

## 2017-10-24 MED ORDER — ENOXAPARIN SODIUM 40 MG/0.4ML ~~LOC~~ SOLN: 40.0000 mg | SUBCUTANEOUS | Status: DC

## 2017-10-24 MED ORDER — ONDANSETRON HCL 4 MG PO TABS
4.0000 mg | ORAL_TABLET | Freq: Four times a day (QID) | ORAL | Status: DC | PRN
  Administered 2017-10-26: 4 mg via ORAL
  Filled 2017-10-24: qty 1

## 2017-10-24 NOTE — Consult Note (Signed)
Physical Medicine and Rehabilitation Consult Reason for Consult: Left side weakness Referring Physician: [facility]   HPI: James Pugh is a 72 y.o. right handed male with history of hyperlipidemia, prediabetic and recent cataract surgery. Per chart review, wife, and patient, patient lives with spouse. Independent and active prior to admission. One level home. Presented 10/22/2017 with left-sided weakness and mild dysarthria. There was reports the patient had struck his head 4 days prior on a beam in his crawl space. There was no loss of consciousness. Cranial CT reviewed, unremarkable for acute intracranial process. Patient did not receive TPA. MRI showed no acute or focal abnormality. MRA without significant proximal stenosis, aneurysm or branch vessel occlusion. Echocardiogram with ejection fraction of 60% no wall motion abnormalities. 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 Currently maintained on aspirin and Plavix for CVA prophylaxis. Subcutaneous Lovenox for DVT prophylaxis. Tolerating a regular diet. Physical and occupational therapy evaluations completed with recommendations of physical medicine rehabilitation consult.   Review of Systems  Constitutional: Negative for chills and fever.  HENT: Negative for hearing loss.   Eyes: Negative for blurred vision and double vision.  Respiratory: Negative for shortness of breath.   Cardiovascular: Negative for chest pain, palpitations and leg swelling.  Gastrointestinal: Positive for constipation. Negative for nausea and vomiting.  Genitourinary: Negative for dysuria, flank pain and hematuria.  Musculoskeletal: Positive for myalgias.  Skin: Negative for rash.  Neurological: Positive for sensory change, speech change and focal weakness. Negative for seizures.  All other systems reviewed and are negative.  Past Medical History:  Diagnosis Date  . CERUMEN IMPACTION 08/22/2009  . HYPERLIPIDEMIA  06/06/2009  . PREDIABETES 07/13/2010   Past Surgical History:  Procedure Laterality Date  . TONSILLECTOMY     Family History  Problem Relation Age of Onset  . Stroke Father   . Heart disease Father   . Diabetes Father        type ll  . Alcohol abuse Other    Social History:  reports that he quit smoking about 34 years ago. His smoking use included cigarettes. He has a 20.00 pack-year smoking history. he has never used smokeless tobacco. He reports that he does not drink alcohol or use drugs. Allergies: No Known Allergies Medications Prior to Admission  Medication Sig Dispense Refill  . aspirin 81 MG tablet Take 81 mg by mouth daily.       Home: Home Living Family/patient expects to be discharged to:: Private residence Living Arrangements: Spouse/significant other Available Help at Discharge: Family, Available 24 hours/day Type of Home: House Home Access: Level entry [place]: One level Bathroom Shower/Tub: Multimedia programmer: [place]: Careers adviser History: Prior Function Level of Independence: Independent Comments: active  Functional Status:  Mobility: Bed Mobility Overal bed mobility: Needs Assistance Bed Mobility: Supine to Sit Supine to sit: Mod assist, HOB elevated General bed mobility comments: assist to elevate trunk Transfers Overall transfer level: Needs assistance Equipment used: Rolling walker (2 wheeled) Transfers: Sit to/from Stand Sit to Stand: Min assist General transfer comment: verbal cues for hand placement, assist to power up Ambulation/Gait Ambulation/Gait assistance: Min assist Ambulation Distance (Feet): 25 Feet Assistive device: Rolling walker (2 wheeled) Gait Pattern/deviations: Step-through pattern, Decreased stride length General Gait Details: mild steppage gait LLE, assist to maintain grip on RW with L hand Gait velocity: decreased Gait velocity interpretation: Below normal speed for  age/gender    ADL: ADL Overall ADL's :  Needs assistance/impaired Eating/Feeding: Set up, Sitting Grooming: Minimal assistance, Sitting Upper Body Bathing: Moderate assistance, Sitting Lower Body Bathing: Moderate assistance, Sit to/from stand Upper Body Dressing : Moderate assistance, Sitting Lower Body Dressing: Moderate assistance, Sit to/from stand Toilet Transfer: Minimal assistance, Ambulation, RW Toileting- Clothing Manipulation and Hygiene: Minimal assistance, Sit to/from stand Tub/ Shower Transfer: Walk-in shower, Minimal assistance, Ambulation, 3 in 1, Rolling walker Functional mobility during ADLs: Minimal assistance, Rolling walker General ADL Comments: Pt completed bed mobility, ambulated in the room from bed to chair.   Cognition: Cognition Overall Cognitive Status: Within Functional Limits for tasks assessed Orientation Level: Oriented X4 Cognition Arousal/Alertness: Awake/alert Behavior During Therapy: WFL for tasks assessed/performed Overall Cognitive Status: Within Functional Limits for tasks assessed  Blood pressure (!) 160/72, pulse (!) 57, temperature 97.8 F (36.6 C), temperature source Oral, resp. rate 18, height 5\' 6"  (1.676 m), weight 76.2 kg (168 lb), SpO2 100 %. Physical Exam  Vitals reviewed. Constitutional: He is oriented to person, place, and time. He appears well-developed and well-nourished.  HENT:  Head: Normocephalic and atraumatic.  Eyes: EOM are normal. Right eye exhibits no discharge. Left eye exhibits no discharge.  Neck: Normal range of motion. Neck supple. No thyromegaly present.  Cardiovascular: Normal rate, regular rhythm and normal heart sounds.  Respiratory: Effort normal and breath sounds normal. No respiratory distress.  GI: Soft. Bowel sounds are normal. He exhibits no distension.  Musculoskeletal:  No edema or tenderness in extremities  Neurological: He is alert and oriented to person, place, and time.  Follows full commands.   Fair awareness of deficits. Motor: RUE/RLE: 5/5 proximal to distal LUE: 4/5 proximal to distal LLE: 4-/5 HF, KE, 2+/5 ADF/PF Sensation intact to light touch  Skin: Skin is warm and dry.  Psychiatric: He has a normal mood and affect. His behavior is normal.    No results found for this or any previous visit (from the past 24 hour(s)). Ct Head Wo Contrast  Result Date: 10/22/2017 CLINICAL DATA:  Left-sided numbness and tingling since 10 a.m. today. EXAM: CT HEAD WITHOUT CONTRAST TECHNIQUE: Contiguous axial images were obtained from the base of the skull through the vertex without intravenous contrast. COMPARISON:  None. FINDINGS: Brain: No evidence of acute infarction, hemorrhage, hydrocephalus, extra-axial collection or mass lesion/mass effect. Mild patchy is likely chronic small vessel ischemia low-density in the cerebral white matter that. Normal brain volume. Vascular: Atherosclerotic calcification.  No hyperdense vessel. Skull: Normal. Negative for fracture or focal lesion. Sinuses/Orbits: Bilateral cataract resection.  No acute finding. IMPRESSION: No acute finding or explanation for symptoms. Electronically Signed   By: Monte Fantasia M.D.   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 Dr. Leonie Man is aware and first noted this finding. Electronically Signed   By: Monte Fantasia M.D.   On: 10/23/2017 08:19   Result Date: 10/23/2017 CLINICAL DATA:  Focal neuro deficit for greater than 6 hours, stroke suspected. New onset left upper and lower extremity weakness beginning at 9 p.m. last night. EXAM: MRI HEAD WITHOUT CONTRAST MRA HEAD WITHOUT CONTRAST MRA NECK WITHOUT AND WITH CONTRAST TECHNIQUE: Multiplanar, multiecho pulse sequences of the brain and surrounding structures were obtained without intravenous contrast. Angiographic images of the Circle of Willis were  obtained using MRA technique without intravenous contrast. Angiographic images of the neck were obtained using MRA technique without and with intravenous contrast.  Carotid stenosis measurements (when applicable) are obtained utilizing NASCET criteria, using the distal internal carotid diameter as the denominator. 36mL MULTIHANCE GADOBENATE DIMEGLUMINE 529 MG/ML IV SOLN COMPARISON:  CT head without contrast from the same day. FINDINGS: MRI HEAD FINDINGS Brain: Diffusion-weighted images demonstrate no acute or subacute infarction. Scattered periventricular and subcortical T2 hyperintensities bilaterally are mildly advanced for age. No acute hemorrhage or mass lesion is present. Ventricles are of normal size. No significant extra-axial fluid collection is present. The internal auditory canals are within normal limits bilaterally. The brainstem and cerebellum are normal. Vascular: Flow is present in the major intracranial arteries. Skull and upper cervical spine: The craniocervical junction is normal. Midline sagittal structures are unremarkable. Marrow signal is normal. Sinuses/Orbits: The paranasal sinuses are clear. There is some fluid in left mastoid air cells. No obstructing nasopharyngeal lesion is present. Bilateral lens replacements are present. Globes and orbits are within normal limits. Other: MRA HEAD FINDINGS The time-of-flight images demonstrate a normal appearance of the internal carotid arteries from the high cervical segments through the ICA termini bilaterally. The A1 and M1 segments are normal. The anterior communicating artery is patent. MCA bifurcations are normal. The ACA and MCA branch vessels are intact. The left vertebral artery is slightly dominant to the right. PICA origins are visualized and normal. The vertebrobasilar junction is normal. The basilar artery is normal. Both posterior cerebral arteries originate from basilar tip. There is some attenuation of distal PCA branch vessels bilaterally  without a significant proximal stenosis or occlusion. MRA NECK FINDINGS The time-of-flight images demonstrate no significant flow disturbance at either carotid bifurcation. Flow is antegrade in the vertebral arteries bilaterally. The postcontrast images demonstrate a 3 vessel arch configuration. The common carotid arteries are within normal limits bilaterally. The right MCA scratched at the right carotid bifurcation is normal. The right cervical ICA is normal. Atherosclerotic changes are present at the left carotid bifurcation with 50% narrowing relative to the more distal vessel. No tandem stenoses are present. The left vertebral artery is slightly dominant to the right. There is a moderate stenosis of the left vertebral artery origin without other tandem stenosis. There is no significant stenosis at the origin of the right vertebral artery. IMPRESSION: 1. No acute or focal abnormality to explain the patient's symptoms. No acute or subacute infarct. 2. Atrophy and white matter changes are mildly advanced for age. 3. Normal MRA circle of will is without significant proximal stenosis, aneurysm, or branch vessel occlusion. 4. 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 5. 50% stenosis at the left carotid bifurcation without a significant stenosis of greater than 50% in the anterior circulation on either side. Electronically Signed: By: San Morelle M.D. 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 Dr. Leonie Man is aware and first noted this finding. Electronically Signed   By: Monte Fantasia M.D.   On: 10/23/2017 08:19   Result Date: 10/23/2017 CLINICAL DATA:  Focal neuro deficit for greater than 6 hours, stroke suspected. New onset left upper and lower extremity weakness beginning at 9 p.m. last night. EXAM: MRI HEAD  WITHOUT CONTRAST MRA HEAD WITHOUT CONTRAST MRA NECK WITHOUT AND WITH CONTRAST TECHNIQUE: Multiplanar, multiecho pulse sequences of the brain and surrounding structures were obtained without intravenous contrast. Angiographic images of the Circle of Willis were obtained using MRA technique without intravenous contrast. Angiographic  images of the neck were obtained using MRA technique without and with intravenous contrast. Carotid stenosis measurements (when applicable) are obtained utilizing NASCET criteria, using the distal internal carotid diameter as the denominator. 67mL MULTIHANCE GADOBENATE DIMEGLUMINE 529 MG/ML IV SOLN COMPARISON:  CT head without contrast from the same day. FINDINGS: MRI HEAD FINDINGS Brain: Diffusion-weighted images demonstrate no acute or subacute infarction. Scattered periventricular and subcortical T2 hyperintensities bilaterally are mildly advanced for age. No acute hemorrhage or mass lesion is present. Ventricles are of normal size. No significant extra-axial fluid collection is present. The internal auditory canals are within normal limits bilaterally. The brainstem and cerebellum are normal. Vascular: Flow is present in the major intracranial arteries. Skull and upper cervical spine: The craniocervical junction is normal. Midline sagittal structures are unremarkable. Marrow signal is normal. Sinuses/Orbits: The paranasal sinuses are clear. There is some fluid in left mastoid air cells. No obstructing nasopharyngeal lesion is present. Bilateral lens replacements are present. Globes and orbits are within normal limits. Other: MRA HEAD FINDINGS The time-of-flight images demonstrate a normal appearance of the internal carotid arteries from the high cervical segments through the ICA termini bilaterally. The A1 and M1 segments are normal. The anterior communicating artery is patent. MCA bifurcations are normal. The ACA and MCA branch vessels are intact. The left vertebral artery is slightly  dominant to the right. PICA origins are visualized and normal. The vertebrobasilar junction is normal. The basilar artery is normal. Both posterior cerebral arteries originate from basilar tip. There is some attenuation of distal PCA branch vessels bilaterally without a significant proximal stenosis or occlusion. MRA NECK FINDINGS The time-of-flight images demonstrate no significant flow disturbance at either carotid bifurcation. Flow is antegrade in the vertebral arteries bilaterally. The postcontrast images demonstrate a 3 vessel arch configuration. The common carotid arteries are within normal limits bilaterally. The right MCA scratched at the right carotid bifurcation is normal. The right cervical ICA is normal. Atherosclerotic changes are present at the left carotid bifurcation with 50% narrowing relative to the more distal vessel. No tandem stenoses are present. The left vertebral artery is slightly dominant to the right. There is a moderate stenosis of the left vertebral artery origin without other tandem stenosis. There is no significant stenosis at the origin of the right vertebral artery. IMPRESSION: 1. No acute or focal abnormality to explain the patient's symptoms. No acute or subacute infarct. 2. Atrophy and white matter changes are mildly advanced for age. 3. Normal MRA circle of will is without significant proximal stenosis, aneurysm, or branch vessel occlusion. 4. 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 5. 50% stenosis at the left carotid bifurcation without a significant stenosis of greater than 50% in the anterior circulation on either side. Electronically Signed: By: San Morelle M.D. 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 Dr. Leonie Man is aware and first noted this finding. Electronically  Signed   By: Monte Fantasia M.D.   On: 10/23/2017 08:19   Result Date: 10/23/2017 CLINICAL DATA:  Focal neuro deficit for greater than 6 hours, stroke suspected. New onset left upper and lower extremity weakness beginning at 9 p.m. last night. EXAM: MRI HEAD WITHOUT CONTRAST MRA HEAD WITHOUT CONTRAST MRA NECK WITHOUT AND WITH CONTRAST TECHNIQUE: Multiplanar, multiecho pulse sequences of the brain and surrounding structures were obtained without intravenous contrast. Angiographic  images of the Circle of Willis were obtained using MRA technique without intravenous contrast. Angiographic images of the neck were obtained using MRA technique without and with intravenous contrast. Carotid stenosis measurements (when applicable) are obtained utilizing NASCET criteria, using the distal internal carotid diameter as the denominator. 64mL MULTIHANCE GADOBENATE DIMEGLUMINE 529 MG/ML IV SOLN COMPARISON:  CT head without contrast from the same day. FINDINGS: MRI HEAD FINDINGS Brain: Diffusion-weighted images demonstrate no acute or subacute infarction. Scattered periventricular and subcortical T2 hyperintensities bilaterally are mildly advanced for age. No acute hemorrhage or mass lesion is present. Ventricles are of normal size. No significant extra-axial fluid collection is present. The internal auditory canals are within normal limits bilaterally. The brainstem and cerebellum are normal. Vascular: Flow is present in the major intracranial arteries. Skull and upper cervical spine: The craniocervical junction is normal. Midline sagittal structures are unremarkable. Marrow signal is normal. Sinuses/Orbits: The paranasal sinuses are clear. There is some fluid in left mastoid air cells. No obstructing nasopharyngeal lesion is present. Bilateral lens replacements are present. Globes and orbits are within normal limits. Other: MRA HEAD FINDINGS The time-of-flight images demonstrate a normal appearance of the internal carotid  arteries from the high cervical segments through the ICA termini bilaterally. The A1 and M1 segments are normal. The anterior communicating artery is patent. MCA bifurcations are normal. The ACA and MCA branch vessels are intact. The left vertebral artery is slightly dominant to the right. PICA origins are visualized and normal. The vertebrobasilar junction is normal. The basilar artery is normal. Both posterior cerebral arteries originate from basilar tip. There is some attenuation of distal PCA branch vessels bilaterally without a significant proximal stenosis or occlusion. MRA NECK FINDINGS The time-of-flight images demonstrate no significant flow disturbance at either carotid bifurcation. Flow is antegrade in the vertebral arteries bilaterally. The postcontrast images demonstrate a 3 vessel arch configuration. The common carotid arteries are within normal limits bilaterally. The right MCA scratched at the right carotid bifurcation is normal. The right cervical ICA is normal. Atherosclerotic changes are present at the left carotid bifurcation with 50% narrowing relative to the more distal vessel. No tandem stenoses are present. The left vertebral artery is slightly dominant to the right. There is a moderate stenosis of the left vertebral artery origin without other tandem stenosis. There is no significant stenosis at the origin of the right vertebral artery. IMPRESSION: 1. No acute or focal abnormality to explain the patient's symptoms. No acute or subacute infarct. 2. Atrophy and white matter changes are mildly advanced for age. 3. Normal MRA circle of will is without significant proximal stenosis, aneurysm, or branch vessel occlusion. 4. 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 5. 50% stenosis at the left carotid bifurcation without a significant stenosis of greater than 50% in the anterior circulation on either side. Electronically Signed:  By: San Morelle M.D. 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  Records reviewed and summated above. Stroke: Continue secondary stroke prophylaxis and Risk Factor Modification listed below:   Antiplatelet therapy:   Blood Pressure Management:  Continue current medication with prn's with permisive HTN per primary team Statin Agent:   Diabetes management:   Left sided hemiparesis: fit for orthosis to prevent contractures (PRAFO) Motor recovery: Fluoxetine  1. Does the need for close, 24 hr/day medical supervision in concert with the patient's rehab needs make it unreasonable  for this patient to be served in a less intensive setting? Yes  2. Co-Morbidities requiring supervision/potential complications: dysarthria (therapies), hyperlipidemia (cont meds), DM (Monitor in accordance with exercise and adjust meds as necessary), recent cataract surgery, HTN (monitor and provide prns in accordance with increased physical exertion and pain) 3. Due to bladder management, safety, disease management and patient education, does the patient require 24 hr/day rehab nursing? Yes 4. Does the patient require coordinated care of a physician, rehab nurse, PT (1-2 hrs/day, 5 days/week), OT (1-2 hrs/day, 5 days/week) and SLP (1-2 hrs/day, 5 days/week) to address physical and functional deficits in the context of the above medical diagnosis(es)? Yes Addressing deficits in the following areas: balance, endurance, locomotion, strength, transferring, bathing, dressing, toileting, speech and psychosocial support 5. Can the patient actively participate in an intensive therapy program of at least 3 hrs of therapy per day at least 5 days per week? Yes 6. The potential for patient to make measurable gains while on inpatient rehab is excellent 7. Anticipated functional outcomes upon discharge from inpatient rehab are  modified independent and supervision  with PT, modified independent and supervision with OT, n/a with SLP. 8. Estimated rehab length of stay to reach the above functional goals is: 9-14 days. 9. Anticipated D/C setting: Home 10. Anticipated post D/C treatments: HH therapy and Home excercise program 11. Overall Rehab/Functional Prognosis: good  RECOMMENDATIONS: This patient's condition is appropriate for continued rehabilitative care in the following setting: CIR Patient has agreed to participate in recommended program. Yes Note that insurance prior authorization may be required for reimbursement for recommended care.  Comment: Rehab Admissions Coordinator to follow up.  Delice Lesch, MD, ABPMR Lavon Paganini Angiulli, PA-C 10/24/2017

## 2017-10-24 NOTE — Progress Notes (Signed)
Inpatient Rehabilitation  Met with patient and spouse at bedside to discuss team's recommendation for IP Rehab.  Shared booklets, insurance verification letter, and answered questions.  Plan to proceed with admission today.  Patient and spouse in agreement and I have notified team.  Call if questions.     , M.A., CCC/SLP Admission Coordinator  [facility]  Cell [phone]  

## 2017-10-24 NOTE — Progress Notes (Signed)
STROKE TEAM PROGRESS NOTE   HISTORY OF PRESENT ILLNESS (per record)  James Pugh is an 72 y.o. male without any significant past medical history presents to the ED with persistent left upper and lower extremity weakness, numbness tingling and ataxic gait which began the night before at 9 PM.  Patient states last night around 9 PM he noted intermittent jerking of his left arm it would last for 30 seconds and continued until almost 4 AM this morning.  Also that time, he noted he was leaning more to his left side with left upper extremity and left lower extremity weakness-feelings of  loss of sensation, numbness and tingling in the left upper and left lower extremity and left face as well as unsteady gait.  He noted his speech had been a little bit slurred but he went to bed and woke up this morning without any improvement in his symptoms.  He called his daughter who was able to confirm that patient did have slurred speech with word finding difficulties.  Admits to feeling off balanced yesterday through to today, denies associated headache, dizziness, visual changes-he had recent cataract surgery, nausea or vomiting.  Patient denies chest pain, shortness of breath recent fevers, chills or  illnesses but admits to being under immense stress.    Patient was brought to the ER by his daughter, who describes patient with unsteady gait as he walked into the ER. Initial Noncon CT of the head did not show any acute abnormalities.  Due to patient presenting with posterior ischemic stroke symptoms, neuro was consulted.  She denies a personal history of stroke but admits to history of stroke in his brother, as well as over 38-year 1 pack/day smoking history.  Of note about 4 days ago, patient hit his head on a beam in his crawl space.  He said he hit the top of his head, without any resultant loss of consciousness but he did see stars and denied any confusion.  Patient's vitals and labs have remained stable while in the  ER.  Dr Leonel Ramsay: On assessment , patient is awake alert and oriented to self place and time with normal comprehension, and repetition and recall. Family states slurred speech has been intermittent and it was evident during assessment.  I have seen the patient reviewed the note. 72 year old male with acute onset left-sided weakness, numbness, ataxia I suspect a small brainstem stroke and he will need to be admitted for secondary risk factor reduction and physical therapy.  Recommendations as described by Jacob Moores.  LSN: 10/21/17 9pm tPA Given: No: out of the time frame window  Premorbid modified Rankin scale (mRS): 0  SUBJECTIVE (INTERVAL HISTORY) The patient's wife and two daughters were at the bedside. They reported that the patient's symptoms are much improved today. Patient found walking with PT and actively participating in therapies. Plan for CIR later today. Family concerned about OSA and need for sleep study. Explained that sleep studies not done while hospitalized. Will consult RT  OBJECTIVE Temp:  [97.8 F (36.6 C)-98.2 F (36.8 C)] 98.2 F (36.8 C) (02/11 1028) Pulse Rate:  [51-65] 58 (02/11 1028) Cardiac Rhythm: Normal sinus rhythm (02/11 0800) Resp:  [18-19] 19 (02/11 1028) BP: (139-163)/(61-83) 160/72 (02/11 0526) SpO2:  [96 %-100 %] 99 % (02/11 1028)  CBC:  Recent Labs  Lab 10/22/17 0943  WBC 6.8  HGB 14.2  HCT 42.5  MCV 87.6  PLT 035    Basic Metabolic Panel:  Recent Labs  Lab 10/22/17  0943  NA 137  K 4.2  CL 102  CO2 25  GLUCOSE 138*  BUN 11  CREATININE 0.99  CALCIUM 9.4    Lipid Panel:     Component Value Date/Time   CHOL 196 10/23/2017 0419   TRIG 131 10/23/2017 0419   HDL 28 (L) 10/23/2017 0419   CHOLHDL 7.0 10/23/2017 0419   VLDL 26 10/23/2017 0419   LDLCALC 142 (H) 10/23/2017 0419   HgbA1c:  Lab Results  Component Value Date   HGBA1C 6.9 (H) 10/23/2017   Urine Drug Screen: No results found for: LABOPIA, COCAINSCRNUR,  LABBENZ, AMPHETMU, THCU, LABBARB  Alcohol Level No results found for: ETH  IMAGING  Ct Head Wo Contrast 10/22/2017 IMPRESSION:  No acute finding or explanation for symptoms.   Mr Jodene Nam Neck W Wo Contrast Mr Jodene Nam Head Wo Contrast 10/22/2017 IMPRESSION:  1. No acute or focal abnormality to explain the patient's symptoms. No acute or subacute infarct.  2. Atrophy and white matter changes are mildly advanced for age.  3. Normal MRA circle of will is without significant proximal stenosis, aneurysm, or branch vessel occlusion.  4. 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  5. 50% stenosis at the left carotid bifurcation without a significant stenosis of greater than 50% in the anterior circulation on either side.   ADDENDUM REPORT: 10/23/2017 08:19 ADDENDUM: Acute perforator infarct in the lower right pons. Dr. Leonie Man is aware and first noted this finding.   Transthoracic Echocardiogram -  Study Conclusions  - Left ventricle: The cavity size was normal. Systolic function was   normal. The estimated ejection fraction was in the range of 55%   to 60%. Wall motion was normal; there were no regional wall   motion abnormalities. Left ventricular diastolic function   parameters were normal. - Aortic valve: There was trivial regurgitation.  PHYSICAL EXAM Vitals:   10/23/17 2100 10/24/17 0117 10/24/17 0526 10/24/17 1028  BP: (!) 162/61 (!) 163/83 (!) 160/72   Pulse: (!) 58 65 (!) 57 (!) 58  Resp: 18 18  19   Temp: 98 F (36.7 C) 98.1 F (36.7 C) 97.8 F (36.6 C) 98.2 F (36.8 C)  TempSrc: Oral Oral Oral Oral  SpO2: 97% 96% 100% 99%  Weight:      Height:       Pleasant elderly Caucasian male currently not in distress. . Afebrile. Head is nontraumatic. Neck is supple without bruit.    Cardiac exam no murmur or gallop. Lungs are clear to auscultation. Distal pulses are well felt. Neurological Exam :  Awake alert  oriented x 3 normal speech and language. Mild left lower face asymmetry. Tongue midline. Mild LUE and LLE drift. Left hemiplegia 4/5 Mild diminished fine finger movements on left. Orbits right over left upper extremity. Mild left grip weak.. Normal sensation . Normal coordination. Gait not tested  ASSESSMENT/PLAN Mr. MATHIUS BIRKELAND is a 72 y.o. male with a remote history of tobacco use, prediabetes and hyperlipidemia presenting with left-sided weakness and ataxia. He did not receive IV t-PA due to late presentation.  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  Resultant  left hemiplegia  CT head - no acute findings  MRI head - acute perforator infarct of the right lower pons initially missed on MRI.  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 Yes; Fluid consistency: Thin Fall precautions  aspirin 81 mg daily prior to admission, now on aspirin 325 mg daily and clopidogrel 75 mg daily  Patient counseled to be compliant with his antithrombotic medications  Ongoing aggressive stroke risk factor management  Therapy recommendations:  CIR  Disposition:  CIR  Hypertension  Stable  Permissive hypertension (OK if < 220/120) but gradually normalize in 5-7 days  Long-term BP goal normotensive  Hyperlipidemia  Home meds:  No lipid lowering medications prior to admission  LDL 142, goal < 70  Now on Lipitor 80 mg daily  Continue statin at discharge  Diabetes (history of prediabetes)  HgbA1c 6.9, goal < 7.0  Controlled  Other Stroke Risk Factors  Advanced age  Former cigarette smoker - quit more than 30 years ago.  Overweight, Body mass index is 27.12 kg/m., recommend weight loss, diet and exercise as appropriate   Family hx stroke (father)   Other  Active Problems  Prediabetes   Plan / Recommendations   Stroke workup - completed  Consult rehabilitation M.D. - done  Consult diabetic teaching nurse - done  Antiplatelet therapy - aspirin 81 mg daily with Plavix 75 mg daily  3 weeks then Plavix alone  Follow-up Dr. Leonie Man in 6 weeks   Hospital day # 1  Attending note: I reviewed above note and agree with the assessment and plan. I have made any additions or clarifications directly to the above note. Pt was seen and examined.   72 year old male with history of HLD, former smoker admitted for left-sided weakness numbness, decreased walking, left facial droop with slurred speech.  CT negative.  MRI showed right lower pontine infarct.  MRA head and neck negative except left ICA 50% stenosis and left VA origin stenosis.  EF 55-60%.  A1c 6.9, LDL 142.  Stroke likely due to small vessel disease.  On aspirin Plavix and Lipitor for stroke prevention.  Continued on discharge. Wife complained of [place] symptoms of OSA, will do outpatient sleep study.  PT/OT recommend CIR.   Neurology will sign off. Please call with questions. Pt will follow up with stroke clinic at [facility] in about 6 weeks. Thanks for the consult.   Rosalin Hawking, MD PhD Stroke Neurology 10/24/2017 10:39 PM     To contact Stroke Continuity provider, please refer to [email or web address] After hours, contact General Neurology

## 2017-10-24 NOTE — Progress Notes (Signed)
  Speech Language Pathology Treatment: Dysphagia  Patient Details Name: James Pugh MRN: [id number] DOB: 13-Jul-1946 Today's Date: 10/24/2017 Time: 0037-0488 SLP Time Calculation (min) (ACUTE ONLY): 10 min  Assessment / Plan / Recommendation Clinical Impression  F/u after yesterday's clinical swallow evaluation.  Pt with excellent toleration of diet today.  Wife present; they both confirm improvements since yesterday with no further incidents of coughing.  Pt observed to consume iced tea (passed three oz test), regular solids without deficit.  No overt s/s of aspiration.  Continue regular diet, thin liquids.  No further SLP f/u is warranted - pt agrees.   HPI HPI: 71 y.o. male with medical history significant of hyperlipidemia who comes in with left-sided weakness. 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 Pt passed stroke swallow screen in ED; RN reporting difficulty with liquids      SLP Plan  All goals met  Patient does not need any further Speech Lanaguage Pathology Services    Recommendations  Diet recommendations: Regular;Thin liquid Liquids provided via: Cup;Straw Medication Administration: Whole meds with liquid Supervision: Patient able to self feed Postural Changes and/or Swallow Maneuvers: Seated upright 90 degrees                Oral Care Recommendations: Oral care BID Follow up Recommendations: None SLP Visit Diagnosis: Dysphagia, unspecified (R13.10) Plan: All goals met       GO                James Pugh 10/24/2017, 2:30 PM

## 2017-10-24 NOTE — Discharge Summary (Signed)
Physician Discharge Summary  James Pugh UJW:[id number] DOB: 12/10/1945 DOA: 10/22/2017  PCP: [name], MD  Admit date: 10/22/2017 Discharge date: 10/24/2017  Time spent: 45 minutes  Recommendations for Outpatient Follow-up:  Patient will be discharged to inpatient rehabilitation.  Patient will need to follow up with primary care provider within one week of discharge.  Follow up with neurology, Dr. Leonie Man, in 6 weeks. Patient should continue medications as prescribed.  Patient should follow a heart healthy/carb modified diet. Patient will need an outpatient sleep study arranged.   Discharge Diagnoses:  Left sided weakness, acute CVA Hyperlipidemia Possible obstructive sleep apnea Diabetes mellitus, type II  Discharge Condition: stable  Diet recommendation: heart healthy/carb modified  Filed Weights   10/22/17 0943  Weight: 76.2 kg (168 lb)    History of present illness:  on 10/22/2017 by Dr. Maryagnes Amos Collieris a 72 y.o.malewith medical history significant ofhyperlipidemia who comes in with left-sided weakness. In brief yesterday patient noted to have some dysarthria, left-sided weakness including arm and leg along with numbness and tingling.Patient went to sleep as he was hoping this would improve however this did not improve in the morning. He is still able to ambulate and walk. He continues to have numbness and tingling. He reports his weakness is getting better. He called his daughter who is a Marine scientist and was brought into EMS. He denies any chest pain, palpitations, nausea, vomiting, abdominal pain, diarrhea, rash, prior stroke.  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  Follow-up with Dr. Leonie Man in 6 weeks -LDL 142, hemoglobin A1c 6.9 -Continue Plavix, statin, aspirin -PT, OT consulted recommended CIR -Inpatient rehab consulted  Hyperlipidemia -Lipid panel shows TC 196, LDL 142, HDL 28, triglycerides 131 -Placed on statin  Possible obstructive sleep apnea -Per family, patient has had apneic episodes. -Case management consulted for outpatient sleep study- will need to be set up  Diabetes mellitus, Type II -newly diagnosed -given age and A1c of 6.9, would not start medications at this time given possibility of hypoglycemia -will need to follow up with PCP  -lifestyle modifications, diet controlled  Consultants Neurology CIR  Procedures  Echocardiogram  Discharge Exam: Vitals:   10/24/17 1028 10/24/17 1359  BP:  (!) 142/69  Pulse: (!) 58 (!) 58  Resp: 19 18  Temp: 98.2 F (36.8 C) 98.3 F (36.8 C)  SpO2: 99% 98%    General: Well developed, well nourished, NAD, appears stated age  HEENT: NCAT, mucous membranes moist.  Cardiovascular: S1 S2 auscultated, RRR, no murmur.  Respiratory: Clear to auscultation bilaterally with equal chest rise  Abdomen: Soft, nontender, nondistended, + bowel sounds  Extremities: warm dry without cyanosis clubbing or edema  Neuro: AAOx3, left sided weakness, 4/5 strength on left  Psych: Normal affect and demeanor with intact judgement and insight  Discharge Instructions Discharge Instructions    Ambulatory referral to Neurology   Complete by:  As directed    An appointment is requested in approximately: 6 weeks Follow up with stroke clinic (Dr Leonie Man preferred, if not available, then consider Caesar Chestnut, Leta Baptist or Jaynee Eagles whoever  is available) at [facility] in about 6-8 weeks. Thanks.   Discharge instructions   Complete by:   As directed    Patient will be discharged to inpatient rehabilitation.  Patient will need to follow up with primary care provider within one week of discharge.  Follow up with neurology, Dr. Leonie Man, in 6 weeks. Patient should continue medications as prescribed.  Patient should follow a heart healthy/carb modified diet. Patient will need an outpatient sleep study arranged.     Allergies as of 10/24/2017   No Known Allergies     Medication List    TAKE these medications   aspirin 81 MG tablet Take 81 mg by mouth daily.   atorvastatin 80 MG tablet Commonly known as:  LIPITOR Take 1 tablet (80 mg total) by mouth daily at 6 PM.   clopidogrel 75 MG tablet Commonly known as:  PLAVIX Take 1 tablet (75 mg total) by mouth daily. Start taking on:  10/25/2017      No Known Allergies Follow-up Information    Garvin Fila, MD. Schedule an appointment as soon as possible for a visit in 6 week(s).   Specialties:  Neurology, Radiology Contact information: [address] [place] [address] [phone]        [name], MD. Schedule an appointment as soon as possible for a visit in 1 week(s).   Specialty:  Family Medicine Why:  Hospital follow up Contact information: [place] [address] [phone]            The results of significant diagnostics from this hospitalization (including imaging, microbiology, ancillary and laboratory) are listed below for reference.    Significant Diagnostic Studies: Ct Head Wo Contrast  Result Date: 10/22/2017 CLINICAL DATA:  Left-sided numbness and tingling since 10 a.m. today. EXAM: CT HEAD WITHOUT CONTRAST TECHNIQUE: Contiguous axial images were obtained from the base of the skull through the vertex without intravenous contrast. COMPARISON:  None. FINDINGS: Brain: No evidence of acute infarction, hemorrhage, hydrocephalus, extra-axial collection or mass lesion/mass effect. Mild patchy is likely  chronic small vessel ischemia low-density in the cerebral white matter that. Normal brain volume. Vascular: Atherosclerotic calcification.  No hyperdense vessel. Skull: Normal. Negative for fracture or focal lesion. Sinuses/Orbits: Bilateral cataract resection.  No acute finding. IMPRESSION: No acute finding or explanation for symptoms. Electronically Signed   By: Monte Fantasia M.D.   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 Dr. Leonie Man is aware and first noted this finding. Electronically Signed   By: Monte Fantasia M.D.   On: 10/23/2017 08:19   Result Date: 10/23/2017 CLINICAL DATA:  Focal neuro deficit for greater than 6 hours, stroke suspected. New onset left upper and lower extremity weakness beginning at 9 p.m. last night. EXAM: MRI HEAD WITHOUT CONTRAST MRA HEAD WITHOUT CONTRAST MRA NECK WITHOUT AND WITH CONTRAST TECHNIQUE: Multiplanar, multiecho pulse sequences of the brain and surrounding structures were obtained without intravenous contrast. Angiographic images of the Circle of Willis were obtained using MRA technique without intravenous contrast. Angiographic images of the neck were obtained using MRA technique without and with intravenous contrast. Carotid stenosis measurements (when applicable) are obtained utilizing NASCET criteria, using the distal internal carotid diameter as the denominator. 7mL MULTIHANCE GADOBENATE DIMEGLUMINE 529 MG/ML IV SOLN COMPARISON:  CT head without contrast from the same day. FINDINGS: MRI HEAD  FINDINGS Brain: Diffusion-weighted images demonstrate no acute or subacute infarction. Scattered periventricular and subcortical T2 hyperintensities bilaterally are mildly advanced for age. No acute hemorrhage or mass lesion is present. Ventricles are of normal size. No significant extra-axial fluid collection is present. The internal auditory  canals are within normal limits bilaterally. The brainstem and cerebellum are normal. Vascular: Flow is present in the major intracranial arteries. Skull and upper cervical spine: The craniocervical junction is normal. Midline sagittal structures are unremarkable. Marrow signal is normal. Sinuses/Orbits: The paranasal sinuses are clear. There is some fluid in left mastoid air cells. No obstructing nasopharyngeal lesion is present. Bilateral lens replacements are present. Globes and orbits are within normal limits. Other: MRA HEAD FINDINGS The time-of-flight images demonstrate a normal appearance of the internal carotid arteries from the high cervical segments through the ICA termini bilaterally. The A1 and M1 segments are normal. The anterior communicating artery is patent. MCA bifurcations are normal. The ACA and MCA branch vessels are intact. The left vertebral artery is slightly dominant to the right. PICA origins are visualized and normal. The vertebrobasilar junction is normal. The basilar artery is normal. Both posterior cerebral arteries originate from basilar tip. There is some attenuation of distal PCA branch vessels bilaterally without a significant proximal stenosis or occlusion. MRA NECK FINDINGS The time-of-flight images demonstrate no significant flow disturbance at either carotid bifurcation. Flow is antegrade in the vertebral arteries bilaterally. The postcontrast images demonstrate a 3 vessel arch configuration. The common carotid arteries are within normal limits bilaterally. The right MCA scratched at the right carotid bifurcation is normal. The right cervical ICA is normal. Atherosclerotic changes are present at the left carotid bifurcation with 50% narrowing relative to the more distal vessel. No tandem stenoses are present. The left vertebral artery is slightly dominant to the right. There is a moderate stenosis of the left vertebral artery origin without other tandem stenosis. There is no  significant stenosis at the origin of the right vertebral artery. IMPRESSION: 1. No acute or focal abnormality to explain the patient's symptoms. No acute or subacute infarct. 2. Atrophy and white matter changes are mildly advanced for age. 3. Normal MRA circle of will is without significant proximal stenosis, aneurysm, or branch vessel occlusion. 4. 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 5. 50% stenosis at the left carotid bifurcation without a significant stenosis of greater than 50% in the anterior circulation on either side. Electronically Signed: By: San Morelle M.D. 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 Dr. Leonie Man is aware and first noted this finding. Electronically Signed   By: Monte Fantasia M.D.   On: 10/23/2017 08:19   Result Date: 10/23/2017 CLINICAL DATA:  Focal neuro deficit for greater than 6 hours, stroke suspected. New onset left upper and lower extremity weakness beginning at 9 p.m. last night. EXAM: MRI HEAD WITHOUT CONTRAST MRA HEAD WITHOUT CONTRAST MRA NECK WITHOUT AND WITH CONTRAST TECHNIQUE: Multiplanar, multiecho pulse sequences of the brain and surrounding structures were obtained without intravenous contrast. Angiographic images of the Circle of Willis were obtained using MRA technique without intravenous contrast. Angiographic images of the neck were obtained using MRA technique without and with intravenous contrast. Carotid stenosis measurements (when applicable) are obtained utilizing NASCET criteria, using the distal internal carotid diameter as the denominator. 65mL MULTIHANCE GADOBENATE DIMEGLUMINE 529 MG/ML IV  SOLN COMPARISON:  CT head without contrast from the same day. FINDINGS: MRI HEAD FINDINGS Brain: Diffusion-weighted images demonstrate no acute or subacute  infarction. Scattered periventricular and subcortical T2 hyperintensities bilaterally are mildly advanced for age. No acute hemorrhage or mass lesion is present. Ventricles are of normal size. No significant extra-axial fluid collection is present. The internal auditory canals are within normal limits bilaterally. The brainstem and cerebellum are normal. Vascular: Flow is present in the major intracranial arteries. Skull and upper cervical spine: The craniocervical junction is normal. Midline sagittal structures are unremarkable. Marrow signal is normal. Sinuses/Orbits: The paranasal sinuses are clear. There is some fluid in left mastoid air cells. No obstructing nasopharyngeal lesion is present. Bilateral lens replacements are present. Globes and orbits are within normal limits. Other: MRA HEAD FINDINGS The time-of-flight images demonstrate a normal appearance of the internal carotid arteries from the high cervical segments through the ICA termini bilaterally. The A1 and M1 segments are normal. The anterior communicating artery is patent. MCA bifurcations are normal. The ACA and MCA branch vessels are intact. The left vertebral artery is slightly dominant to the right. PICA origins are visualized and normal. The vertebrobasilar junction is normal. The basilar artery is normal. Both posterior cerebral arteries originate from basilar tip. There is some attenuation of distal PCA branch vessels bilaterally without a significant proximal stenosis or occlusion. MRA NECK FINDINGS The time-of-flight images demonstrate no significant flow disturbance at either carotid bifurcation. Flow is antegrade in the vertebral arteries bilaterally. The postcontrast images demonstrate a 3 vessel arch configuration. The common carotid arteries are within normal limits bilaterally. The right MCA scratched at the right carotid bifurcation is normal. The right cervical ICA is normal. Atherosclerotic changes are present at the left carotid  bifurcation with 50% narrowing relative to the more distal vessel. No tandem stenoses are present. The left vertebral artery is slightly dominant to the right. There is a moderate stenosis of the left vertebral artery origin without other tandem stenosis. There is no significant stenosis at the origin of the right vertebral artery. IMPRESSION: 1. No acute or focal abnormality to explain the patient's symptoms. No acute or subacute infarct. 2. Atrophy and white matter changes are mildly advanced for age. 3. Normal MRA circle of will is without significant proximal stenosis, aneurysm, or branch vessel occlusion. 4. 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 5. 50% stenosis at the left carotid bifurcation without a significant stenosis of greater than 50% in the anterior circulation on either side. Electronically Signed: By: San Morelle M.D. 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 Dr. Leonie Man is aware and first noted this finding. Electronically Signed   By: Monte Fantasia M.D.   On: 10/23/2017 08:19   Result Date: 10/23/2017 CLINICAL DATA:  Focal neuro deficit for greater than 6 hours, stroke suspected. New onset left upper and lower extremity weakness beginning at 9 p.m. last night. EXAM: MRI HEAD WITHOUT CONTRAST MRA HEAD WITHOUT CONTRAST MRA NECK WITHOUT AND WITH CONTRAST TECHNIQUE: Multiplanar, multiecho pulse sequences of the brain and surrounding structures were obtained without intravenous contrast. Angiographic images of the Circle of Willis were obtained using MRA technique without intravenous contrast. Angiographic images of the neck were obtained using MRA technique without and with intravenous contrast. Carotid stenosis measurements (when applicable) are obtained utilizing NASCET criteria, using  the distal internal carotid diameter as the denominator. 17mL MULTIHANCE GADOBENATE DIMEGLUMINE 529 MG/ML IV SOLN COMPARISON:  CT head without contrast from the same day. FINDINGS: MRI HEAD FINDINGS Brain: Diffusion-weighted images demonstrate no acute or subacute infarction. Scattered periventricular and subcortical T2 hyperintensities bilaterally are mildly advanced for age. No acute hemorrhage or mass lesion is present. Ventricles are of normal size. No significant extra-axial fluid collection is present. The internal auditory canals are within normal limits bilaterally. The brainstem and cerebellum are normal. Vascular: Flow is present in the major intracranial arteries. Skull and upper cervical spine: The craniocervical junction is normal. Midline sagittal structures are unremarkable. Marrow signal is normal. Sinuses/Orbits: The paranasal sinuses are clear. There is some fluid in left mastoid air cells. No obstructing nasopharyngeal lesion is present. Bilateral lens replacements are present. Globes and orbits are within normal limits. Other: MRA HEAD FINDINGS The time-of-flight images demonstrate a normal appearance of the internal carotid arteries from the high cervical segments through the ICA termini bilaterally. The A1 and M1 segments are normal. The anterior communicating artery is patent. MCA bifurcations are normal. The ACA and MCA branch vessels are intact. The left vertebral artery is slightly dominant to the right. PICA origins are visualized and normal. The vertebrobasilar junction is normal. The basilar artery is normal. Both posterior cerebral arteries originate from basilar tip. There is some attenuation of distal PCA branch vessels bilaterally without a significant proximal stenosis or occlusion. MRA NECK FINDINGS The time-of-flight images demonstrate no significant flow disturbance at either carotid bifurcation. Flow is antegrade in the vertebral arteries bilaterally. The postcontrast images  demonstrate a 3 vessel arch configuration. The common carotid arteries are within normal limits bilaterally. The right MCA scratched at the right carotid bifurcation is normal. The right cervical ICA is normal. Atherosclerotic changes are present at the left carotid bifurcation with 50% narrowing relative to the more distal vessel. No tandem stenoses are present. The left vertebral artery is slightly dominant to the right. There is a moderate stenosis of the left vertebral artery origin without other tandem stenosis. There is no significant stenosis at the origin of the right vertebral artery. IMPRESSION: 1. No acute or focal abnormality to explain the patient's symptoms. No acute or subacute infarct. 2. Atrophy and white matter changes are mildly advanced for age. 3. Normal MRA circle of will is without significant proximal stenosis, aneurysm, or branch vessel occlusion. 4. 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 5. 50% stenosis at the left carotid bifurcation without a significant stenosis of greater than 50% in the anterior circulation on either side. Electronically Signed: By: San Morelle M.D. On: 10/22/2017 17:51    Microbiology: No results found for this or any previous visit (from the past 240 hour(s)).   Labs: Basic Metabolic Panel: Recent Labs  Lab 10/22/17 0943  NA 137  K 4.2  CL 102  CO2 25  GLUCOSE 138*  BUN 11  CREATININE 0.99  CALCIUM 9.4   Liver Function Tests: No results for input(s): AST, ALT, ALKPHOS, BILITOT, PROT, ALBUMIN in the last 168 hours. No results for input(s): LIPASE, AMYLASE in the last 168 hours. No results for input(s): AMMONIA in the last 168 hours. CBC: Recent Labs  Lab 10/22/17 0943  WBC 6.8  HGB 14.2  HCT 42.5  MCV 87.6  PLT 337   Cardiac Enzymes: Recent Labs  Lab 10/22/17 1000  TROPONINI <0.03   BNP: BNP (last 3 results) No results  for input(s): BNP in the  last 8760 hours.  ProBNP (last 3 results) No results for input(s): PROBNP in the last 8760 hours.  CBG: Recent Labs  Lab 10/22/17 1249  GLUCAP 108*       Signed:  Prabhjot Maddux  [facility] 10/24/2017, 3:16 PM

## 2017-10-24 NOTE — Progress Notes (Signed)
Physical Therapy Treatment Patient Details Name: James Pugh MRN: [id number] DOB: Feb 11, 1946 Today's Date: 10/24/2017    History of Present Illness 72 y.o. male with medical history significant of hyperlipidemia who comes in with left-sided weakness. Brain MRI- right pontine infarct.     PT Comments    Patient progressing well towards PT goals. Demonstrates incoordination LLE, decreased foot clearance and left knee instability during gait training requiring cues and Min A for balance/safety. Able to activate DF on left but using steppage gait pattern to advance LLE during gait. Might benefit from recurvatum wrap on LLE next session. Educated family re: neuroplasticity and importance of using LUE/LE functionally for all tasks. Great CIR candidate with excellent family support. Pt highly motivated and extremely independent PTA. Will follow.    Follow Up Recommendations  CIR     Equipment Recommendations  None recommended by PT    Recommendations for Other Services       Precautions / Restrictions Precautions Precautions: Fall Restrictions Weight Bearing Restrictions: No    Mobility  Bed Mobility Overal bed mobility: Needs Assistance Bed Mobility: Supine to Sit     Supine to sit: Supervision;HOB elevated     General bed mobility comments: No assist needed, use of rail for support.   Transfers Overall transfer level: Needs assistance Equipment used: None Transfers: Sit to/from Stand Sit to Stand: Min guard         General transfer comment: Min guard for safety. Cues to place equal WB through BLEs to stand and slow descent. Stood from Adult nurse.  Ambulation/Gait Ambulation/Gait assistance: Min assist Ambulation Distance (Feet): 100 Feet(+ 50') Assistive device: None Gait Pattern/deviations: Step-through pattern;Decreased stride length;Decreased dorsiflexion - left;Steppage   Gait velocity interpretation: at or above normal speed for age/gender General Gait  Details: Pt with decreased foot clearance LLE using steppage gait pattern to advance LLE, encouraged arm swing bilaterally. Left knee instability noted as well. 1 seated rest break. Min A for balance. Incoordination noted LLE.   Stairs            Wheelchair Mobility    Modified Rankin (Stroke Patients Only) Modified Rankin (Stroke Patients Only) Pre-Morbid Rankin Score: No symptoms Modified Rankin: Moderate disability     Balance Overall balance assessment: Needs assistance Sitting-balance support: Feet supported;No upper extremity supported Sitting balance-Leahy Scale: Good Sitting balance - Comments: Able to reach outside BoS and donn/doff socks with Min A to bring LLE up x2. Encouraged use of LUE as well.    Standing balance support: During functional activity Standing balance-Leahy Scale: Fair Standing balance comment: able to maintain static standing briefly with Min guard, requires Min A for dynamic standing.                             Cognition Arousal/Alertness: Awake/alert Behavior During Therapy: WFL for tasks assessed/performed Overall Cognitive Status: Within Functional Limits for tasks assessed                                        Exercises Other Exercises Other Exercises: Sit to stand x6 from chair with cues for UEs with WB through LLE and equal WB through BLEs to stand as pt favors RLE.  Other Exercises: Pt completing LUE AROM shoulder forward flexion (0-90*) and elbow flexion/ext for 5-10 reps     General Comments General comments (skin  integrity, edema, etc.): Worked on functionally using LUE re: grabbing cup to take a drink of water and opening/closing small caps. Daughters and wife present during session and very supportive.       Pertinent Vitals/Pain Pain Assessment: No/denies pain    Home Living                      Prior Function            PT Goals (current goals can now be found in the care plan  section) Acute Rehab PT Goals Patient Stated Goal: home, independence Progress towards PT goals: Progressing toward goals    Frequency    Min 4X/week      PT Plan Current plan remains appropriate    Co-evaluation              AM-PAC PT "6 Clicks" Daily Activity  Outcome Measure  Difficulty turning over in bed (including adjusting bedclothes, sheets and blankets)?: None Difficulty moving from lying on back to sitting on the side of the bed? : None Difficulty sitting down on and standing up from a chair with arms (e.g., wheelchair, bedside commode, etc,.)?: A Little Help needed moving to and from a bed to chair (including a wheelchair)?: A Little Help needed walking in hospital room?: A Little Help needed climbing 3-5 steps with a railing? : A Lot 6 Click Score: 19    End of Session Equipment Utilized During Treatment: Gait belt Activity Tolerance: Patient tolerated treatment well Patient left: in bed;with call bell/phone within reach;with nursing/sitter in room;with family/visitor present Nurse Communication: Mobility status PT Visit Diagnosis: Other abnormalities of gait and mobility (R26.89)     Time: 6203-5597 PT Time Calculation (min) (ACUTE ONLY): 32 min  Charges:  $Gait Training: 8-22 mins $Neuromuscular Re-education: 8-22 mins                    G Codes:       Wray Kearns, PT, DPT [place] 10/24/2017, 10:33 AM

## 2017-10-24 NOTE — Evaluation (Signed)
Speech Language Pathology Evaluation Patient Details Name: James Pugh MRN: [id number] DOB: Apr 24, 1946 Today's Date: 10/24/2017 Time: 3536-1443 SLP Time Calculation (min) (ACUTE ONLY): 71 min  Problem List:  Patient Active Problem List   Diagnosis Date Noted  . CVA (cerebral vascular accident) ([place]) 10/22/2017  . PREDIABETES 07/13/2010  . CERUMEN IMPACTION 08/22/2009  . Dyslipidemia 06/06/2009   Past Medical History:  Past Medical History:  Diagnosis Date  . CERUMEN IMPACTION 08/22/2009  . HYPERLIPIDEMIA 06/06/2009  . PREDIABETES 07/13/2010   Past Surgical History:  Past Surgical History:  Procedure Laterality Date  . TONSILLECTOMY     HPI:  72 y.o. male with medical history significant of hyperlipidemia who comes in with left-sided weakness. 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 Pt passed stroke swallow screen in ED; RN reporting difficulty with liquids   Assessment / Plan / Recommendation Clinical Impression  Pt presents with normal speech, language, and cognition with no dysarthria, fluent output, good awareness, normal working memory and insight.  No SLP needs are identified - our services will sign off.     SLP Assessment  SLP Recommendation/Assessment: Patient does not need any further Speech Lanaguage Pathology Services    Follow Up Recommendations  None    Frequency and Duration           SLP Evaluation Cognition  Overall Cognitive Status: Within Functional Limits for tasks assessed Arousal/Alertness: Awake/alert Orientation Level: Oriented X4 Attention: Selective Selective Attention: Appears intact Memory: Appears intact Awareness: Appears intact Problem Solving: Appears intact       Comprehension  Auditory Comprehension Overall Auditory Comprehension: Appears within functional limits for tasks assessed    Expression Expression Primary Mode of Expression:  Verbal Verbal Expression Overall Verbal Expression: Appears within functional limits for tasks assessed Written Expression Dominant Hand: Right   Oral / Motor  Oral Motor/Sensory Function Overall Oral Motor/Sensory Function: Within functional limits Motor Speech Overall Motor Speech: Appears within functional limits for tasks assessed   GO                    [name] 10/24/2017, 2:27 PM

## 2017-10-24 NOTE — H&P (Signed)
Physical Medicine and Rehabilitation Admission H&P    Chief Complaint  Patient presents with  . Weakness    left side  : HPI: James Pugh is a 72 y.o. right handed male with history of hyperlipidemia, prediabetic and recent cataract surgery. Per chart review, wife, and patient, patient lives with spouse. Independent and active prior to admission. One level home. Presented 10/22/2017 with left-sided weakness and mild dysarthria. There was reports the patient had struck his head 4 days prior on a beam in his crawl space. There was no loss of consciousness. Cranial CT reviewed, unremarkable for acute intracranial process. Patient did not receive TPA. MRI showed no acute or focal abnormality. MRA without significant proximal stenosis, aneurysm or branch vessel occlusion. Echocardiogram with ejection fraction of 60% no wall motion abnormalities. 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 Currently maintained on aspirin and Plavix for CVA prophylaxis. Subcutaneous Lovenox for DVT prophylaxis. Tolerating a regular diet. Physical and occupational therapy evaluations completed with recommendations of physical medicine rehabilitation consult. Patient was admitted for a comprehensive rehabilitation program. (See consult note from today)  Review of Systems  Constitutional: Negative for chills and fever.  HENT: Negative for hearing loss.   Eyes: Negative for blurred vision, double vision and photophobia.  Respiratory: Negative for cough, hemoptysis and shortness of breath.   Cardiovascular: Negative for palpitations and leg swelling.  Gastrointestinal: Positive for constipation. Negative for nausea and vomiting.  Genitourinary: Negative for dysuria, flank pain and hematuria.  Musculoskeletal: Positive for myalgias.  Skin: Negative for rash.  Neurological: Positive for sensory change, speech change and focal weakness. Negative for seizures.  All other systems  reviewed and are negative.  Past Medical History:  Diagnosis Date  . CERUMEN IMPACTION 08/22/2009  . HYPERLIPIDEMIA 06/06/2009  . PREDIABETES 07/13/2010   Past Surgical History:  Procedure Laterality Date  . TONSILLECTOMY     Family History  Problem Relation Age of Onset  . Stroke Father   . Heart disease Father   . Diabetes Father        type ll  . Alcohol abuse Other    Social History:  reports that he quit smoking about 34 years ago. His smoking use included cigarettes. He has a 20.00 pack-year smoking history. he has never used smokeless tobacco. He reports that he does not drink alcohol or use drugs. Allergies: No Known Allergies Medications Prior to Admission  Medication Sig Dispense Refill  . aspirin 81 MG tablet Take 81 mg by mouth daily.       Drug Regimen Review Drug regimen was reviewed and remains appropriate with no significant issues identified  Home: Home Living Family/patient expects to be discharged to:: Private residence Living Arrangements: Spouse/significant other Available Help at Discharge: Family, Available 24 hours/day Type of Home: House Home Access: Level entry Home Layout: One level Bathroom Shower/Tub: Multimedia programmer: Standard Home Equipment: Civil engineer, contracting  Lives With: Spouse   Functional History: Prior Function Level of Independence: Independent Comments: active   Functional Status:  Mobility: Bed Mobility Overal bed mobility: Needs Assistance Bed Mobility: Supine to Sit Supine to sit: Supervision, HOB elevated General bed mobility comments: No assist needed, use of rail for support.  Transfers Overall transfer level: Needs assistance Equipment used: None Transfers: Sit to/from Stand Sit to Stand: Min guard General transfer comment: Min guard for safety. Cues to place equal WB through BLEs to stand and slow descent. Stood from Adult nurse. Ambulation/Gait Ambulation/Gait assistance:  Min assist Ambulation Distance  (Feet): 100 Feet(+ 50') Assistive device: None Gait Pattern/deviations: Step-through pattern, Decreased stride length, Decreased dorsiflexion - left, Steppage General Gait Details: Pt with decreased foot clearance LLE using steppage gait pattern to advance LLE, encouraged arm swing bilaterally. Left knee instability noted as well. 1 seated rest break. Min A for balance. Incoordination noted LLE. Gait velocity: decreased Gait velocity interpretation: at or above normal speed for age/gender    ADL: ADL Overall ADL's : Needs assistance/impaired Eating/Feeding: Set up, Sitting Grooming: Oral care, Wash/dry face, Minimal assistance, Standing Upper Body Bathing: Moderate assistance, Sitting Lower Body Bathing: Moderate assistance, Sit to/from stand Upper Body Dressing : Moderate assistance, Sitting Lower Body Dressing: Moderate assistance, Sit to/from stand Toilet Transfer: Minimal assistance, Ambulation, RW Toileting- Clothing Manipulation and Hygiene: Minimal assistance, Sit to/from stand Tub/ Shower Transfer: Walk-in shower, Minimal assistance, Ambulation, 3 in 1, Rolling walker Functional mobility during ADLs: Minimal assistance, Rolling walker General ADL Comments: Pt completing room level functional mobility and standing grooming ADLs; additional focus on LUE AROM and fine motor HEP with handouts provided; education provided/questions answered regarding CIR process as post-op therapy   Cognition: Cognition Overall Cognitive Status: Within Functional Limits for tasks assessed Arousal/Alertness: Awake/alert Orientation Level: Oriented X4 Attention: Selective Selective Attention: Appears intact Memory: Appears intact Awareness: Appears intact Problem Solving: Appears intact Cognition Arousal/Alertness: Awake/alert Behavior During Therapy: WFL for tasks assessed/performed Overall Cognitive Status: Within Functional Limits for tasks assessed  Physical Exam: Blood pressure (!)  142/69, pulse (!) 58, temperature 98.3 F (36.8 C), temperature source Oral, resp. rate 18, height 5\' 6"  (1.676 m), weight 76.2 kg (168 lb), SpO2 98 %. Physical Exam  Constitutional: He is oriented to person, place, and time. He appears well-developed and well-nourished.  HENT:  Head: Normocephalic and atraumatic.  Eyes: EOM are normal. Right eye exhibits no discharge. Left eye exhibits no discharge.  Neck: Normal range of motion. Neck supple.  Cardiovascular: Normal rate and regular rhythm.  Respiratory: Effort normal and breath sounds normal.  GI: Soft. Bowel sounds are normal.  Musculoskeletal:  No edema or tenderness in extremities  Neurological: He is alert and oriented to person, place, and time.  Motor: RUE/RLE: 5/5 proximal to distal LUE: 4/5 proximal to distal LLE: 4-/5 HF, KE, 2+/5 ADF/PF Sensation intact to light touch   Skin: Skin is warm and dry.  Psychiatric: He has a normal mood and affect. His behavior is normal. Thought content normal.    Results for orders placed or performed during the hospital encounter of 10/22/17 (from the past 48 hour(s))  Hemoglobin A1c     Status: Abnormal   Collection Time: 10/23/17  4:19 AM  Result Value Ref Range   Hgb A1c MFr Bld 6.9 (H) 4.8 - 5.6 %    Comment: (NOTE) Pre diabetes:          5.7%-6.4% Diabetes:              >6.4% Glycemic control for   <7.0% adults with diabetes    Mean Plasma Glucose 151.33 mg/dL    Comment: Performed at [facility] [address], [place], [place] [address]  Lipid panel     Status: Abnormal   Collection Time: 10/23/17  4:19 AM  Result Value Ref Range   Cholesterol 196 0 - 200 mg/dL   Triglycerides 131 <150 mg/dL   HDL 28 (L) >40 mg/dL   Total CHOL/HDL Ratio 7.0 RATIO   VLDL 26 0 - 40 mg/dL  LDL Cholesterol 142 (H) 0 - 99 mg/dL    Comment:        Total Cholesterol/HDL:CHD Risk Coronary Heart Disease Risk Table                     Men   Women  1/2 Average Risk   3.4   3.3  Average  Risk       5.0   4.4  2 X Average Risk   9.6   7.1  3 X Average Risk  23.4   11.0        Use the calculated Patient Ratio above and the CHD Risk Table to determine the patient's CHD Risk.        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 Dr. Leonie Man is aware and first noted this finding. Electronically Signed   By: Monte Fantasia M.D.   On: 10/23/2017 08:19   Result Date: 10/23/2017 CLINICAL DATA:  Focal neuro deficit for greater than 6 hours, stroke suspected. New onset left upper and lower extremity weakness beginning at 9 p.m. last night. EXAM: MRI HEAD WITHOUT CONTRAST MRA HEAD WITHOUT CONTRAST MRA NECK WITHOUT AND WITH CONTRAST TECHNIQUE: Multiplanar, multiecho pulse sequences of the brain and surrounding structures were obtained without intravenous contrast. Angiographic images of the Circle of Willis were obtained using MRA technique without intravenous contrast. Angiographic images of the neck were obtained using MRA technique without and with intravenous contrast. Carotid stenosis measurements (when applicable) are obtained utilizing NASCET criteria, using the distal internal carotid diameter as the denominator. 17mL MULTIHANCE GADOBENATE DIMEGLUMINE 529 MG/ML IV SOLN COMPARISON:  CT head without contrast from the same day. FINDINGS: MRI HEAD FINDINGS Brain: Diffusion-weighted images demonstrate no acute or subacute infarction. Scattered periventricular and subcortical T2 hyperintensities bilaterally are mildly advanced for age. No acute hemorrhage or mass lesion is present. Ventricles are of normal size. No  significant extra-axial fluid collection is present. The internal auditory canals are within normal limits bilaterally. The brainstem and cerebellum are normal. Vascular: Flow is present in the major intracranial arteries. Skull and upper cervical spine: The craniocervical junction is normal. Midline sagittal structures are unremarkable. Marrow signal is normal. Sinuses/Orbits: The paranasal sinuses are clear. There is some fluid in left mastoid air cells. No obstructing nasopharyngeal lesion is present. Bilateral lens replacements are present. Globes and orbits are within normal limits. Other: MRA HEAD FINDINGS The time-of-flight images demonstrate a normal appearance of the internal carotid arteries from the high cervical segments through the ICA termini bilaterally. The A1 and M1 segments are normal. The anterior communicating artery is patent. MCA bifurcations are normal. The ACA and MCA branch vessels are intact. The left vertebral artery is slightly dominant to the right. PICA origins are visualized and normal. The vertebrobasilar junction is normal. The basilar artery is normal. Both posterior cerebral arteries originate from basilar tip. There is some attenuation  of distal PCA branch vessels bilaterally without a significant proximal stenosis or occlusion. MRA NECK FINDINGS The time-of-flight images demonstrate no significant flow disturbance at either carotid bifurcation. Flow is antegrade in the vertebral arteries bilaterally. The postcontrast images demonstrate a 3 vessel arch configuration. The common carotid arteries are within normal limits bilaterally. The right MCA scratched at the right carotid bifurcation is normal. The right cervical ICA is normal. Atherosclerotic changes are present at the left carotid bifurcation with 50% narrowing relative to the more distal vessel. No tandem stenoses are present. The left vertebral artery is slightly dominant to the right. There is a moderate stenosis of the  left vertebral artery origin without other tandem stenosis. There is no significant stenosis at the origin of the right vertebral artery. IMPRESSION: 1. No acute or focal abnormality to explain the patient's symptoms. No acute or subacute infarct. 2. Atrophy and white matter changes are mildly advanced for age. 3. Normal MRA circle of will is without significant proximal stenosis, aneurysm, or branch vessel occlusion. 4. 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 5. 50% stenosis at the left carotid bifurcation without a significant stenosis of greater than 50% in the anterior circulation on either side. Electronically Signed: By: San Morelle M.D. 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 Dr. Leonie Man is aware and first noted this finding. Electronically Signed   By: Monte Fantasia M.D.   On: 10/23/2017 08:19   Result Date: 10/23/2017 CLINICAL DATA:  Focal neuro deficit for greater than 6 hours, stroke suspected. New onset left upper and lower extremity weakness beginning at 9 p.m. last night. EXAM: MRI HEAD WITHOUT CONTRAST MRA HEAD WITHOUT CONTRAST MRA NECK WITHOUT AND WITH CONTRAST TECHNIQUE: Multiplanar, multiecho pulse sequences of the brain and surrounding structures were obtained without intravenous contrast. Angiographic images of the Circle of Willis were obtained using MRA technique without intravenous contrast. Angiographic images of the neck were obtained using MRA technique without and with intravenous contrast. Carotid stenosis measurements (when applicable) are obtained utilizing NASCET criteria, using the distal internal carotid diameter as the denominator. 53mL MULTIHANCE GADOBENATE DIMEGLUMINE 529 MG/ML IV SOLN COMPARISON:  CT head without contrast from the same day. FINDINGS: MRI HEAD FINDINGS  Brain: Diffusion-weighted images demonstrate no acute or subacute infarction. Scattered periventricular and subcortical T2 hyperintensities bilaterally are mildly advanced for age. No acute hemorrhage or mass lesion is present. Ventricles are of normal size. No significant extra-axial fluid collection is present. The internal auditory canals are within normal limits bilaterally. The brainstem and cerebellum are normal. Vascular: Flow is present in the major intracranial arteries. Skull and upper cervical spine: The craniocervical junction is normal. Midline sagittal structures are unremarkable. Marrow signal is normal. Sinuses/Orbits: The paranasal sinuses are clear. There is some fluid in left mastoid air cells. No obstructing nasopharyngeal lesion is present. Bilateral lens replacements are present. Globes and orbits are within normal limits. Other: MRA HEAD FINDINGS The time-of-flight images demonstrate a normal appearance of the internal carotid arteries from the high cervical segments through the ICA termini bilaterally. The A1 and M1 segments are normal. The anterior communicating artery is patent. MCA bifurcations are normal. The ACA and MCA branch vessels are intact. The left vertebral artery is slightly dominant to the right. PICA origins are visualized and normal. The vertebrobasilar junction is normal. The basilar artery  is normal. Both posterior cerebral arteries originate from basilar tip. There is some attenuation of distal PCA branch vessels bilaterally without a significant proximal stenosis or occlusion. MRA NECK FINDINGS The time-of-flight images demonstrate no significant flow disturbance at either carotid bifurcation. Flow is antegrade in the vertebral arteries bilaterally. The postcontrast images demonstrate a 3 vessel arch configuration. The common carotid arteries are within normal limits bilaterally. The right MCA scratched at the right carotid bifurcation is normal. The right cervical ICA is  normal. Atherosclerotic changes are present at the left carotid bifurcation with 50% narrowing relative to the more distal vessel. No tandem stenoses are present. The left vertebral artery is slightly dominant to the right. There is a moderate stenosis of the left vertebral artery origin without other tandem stenosis. There is no significant stenosis at the origin of the right vertebral artery. IMPRESSION: 1. No acute or focal abnormality to explain the patient's symptoms. No acute or subacute infarct. 2. Atrophy and white matter changes are mildly advanced for age. 3. Normal MRA circle of will is without significant proximal stenosis, aneurysm, or branch vessel occlusion. 4. 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 5. 50% stenosis at the left carotid bifurcation without a significant stenosis of greater than 50% in the anterior circulation on either side. Electronically Signed: By: San Morelle M.D. 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 Dr. Leonie Man is aware and first noted this finding. Electronically Signed   By: Monte Fantasia M.D.   On: 10/23/2017 08:19   Result Date: 10/23/2017 CLINICAL DATA:  Focal neuro deficit for greater than 6 hours, stroke suspected. New onset left upper and lower extremity weakness beginning at 9 p.m. last night. EXAM: MRI HEAD WITHOUT CONTRAST MRA HEAD WITHOUT CONTRAST MRA NECK WITHOUT AND WITH CONTRAST TECHNIQUE: Multiplanar, multiecho pulse sequences of the brain and surrounding structures were obtained without intravenous contrast. Angiographic images of the Circle of Willis were obtained using MRA technique without intravenous contrast. Angiographic images of the neck were obtained using MRA technique without and with intravenous contrast. Carotid stenosis measurements  (when applicable) are obtained utilizing NASCET criteria, using the distal internal carotid diameter as the denominator. 51mL MULTIHANCE GADOBENATE DIMEGLUMINE 529 MG/ML IV SOLN COMPARISON:  CT head without contrast from the same day. FINDINGS: MRI HEAD FINDINGS Brain: Diffusion-weighted images demonstrate no acute or subacute infarction. Scattered periventricular and subcortical T2 hyperintensities bilaterally are mildly advanced for age. No acute hemorrhage or mass lesion is present. Ventricles are of normal size. No significant extra-axial fluid collection is present. The internal auditory canals are within normal limits bilaterally. The brainstem and cerebellum are normal. Vascular: Flow is present in the major intracranial arteries. Skull and upper cervical spine: The craniocervical junction is normal. Midline sagittal structures are unremarkable. Marrow signal is normal. Sinuses/Orbits: The paranasal sinuses are clear. There is some fluid in left mastoid air cells. No obstructing nasopharyngeal lesion is present. Bilateral lens replacements are present. Globes and orbits are within normal limits. Other: MRA HEAD FINDINGS The time-of-flight images demonstrate a normal appearance of the internal carotid arteries from the high cervical segments through the ICA termini bilaterally. The A1 and M1 segments are normal. The anterior communicating artery is patent. MCA bifurcations are normal. The ACA and MCA branch vessels are intact. The left vertebral artery is slightly dominant to the  right. PICA origins are visualized and normal. The vertebrobasilar junction is normal. The basilar artery is normal. Both posterior cerebral arteries originate from basilar tip. There is some attenuation of distal PCA branch vessels bilaterally without a significant proximal stenosis or occlusion. MRA NECK FINDINGS The time-of-flight images demonstrate no significant flow disturbance at either carotid bifurcation. Flow is antegrade in  the vertebral arteries bilaterally. The postcontrast images demonstrate a 3 vessel arch configuration. The common carotid arteries are within normal limits bilaterally. The right MCA scratched at the right carotid bifurcation is normal. The right cervical ICA is normal. Atherosclerotic changes are present at the left carotid bifurcation with 50% narrowing relative to the more distal vessel. No tandem stenoses are present. The left vertebral artery is slightly dominant to the right. There is a moderate stenosis of the left vertebral artery origin without other tandem stenosis. There is no significant stenosis at the origin of the right vertebral artery. IMPRESSION: 1. No acute or focal abnormality to explain the patient's symptoms. No acute or subacute infarct. 2. Atrophy and white matter changes are mildly advanced for age. 3. Normal MRA circle of will is without significant proximal stenosis, aneurysm, or branch vessel occlusion. 4. 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 5. 50% stenosis at the left carotid bifurcation without a significant stenosis of greater than 50% in the anterior circulation on either side. Electronically Signed: By: San Morelle M.D. On: 10/22/2017 17:51       Medical Problem List and Plan: 1.  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  DVT Prophylaxis/Anticoagulation: Subcutaneous Lovenox. Monitor for any bleeding episodes 3. Pain Management: Tylenol as needed 4. Mood: Provide emotional support 5. Neuropsych: This patient is capable of making decisions on his own behalf. 6. Skin/Wound Care: Routine skin checks 7. Fluids/Electrolytes/Nutrition: Routine I&O's with follow-up chemistries 8. Hyperlipidemia. Lipitor 9. Prediabetic. Hemoglobin A1c 6.9. Blood sugar checks discontinued. Follow-up chemistries 10. Recent cataract surgery. Follow-up outpatient 11.  Constipation. Laxative assistance  Post Admission Physician Evaluation: 1. Preadmission assessment reviewed and changes made below. 2. 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 3. Patient is admitted to receive collaborative, interdisciplinary care between the physiatrist, rehab nursing staff, and therapy team. 4. Patient's level of medical complexity and substantial therapy needs in context of that medical necessity cannot be provided at a lesser intensity of care such as a SNF. 5. Patient has experienced substantial functional loss from his/her baseline which was documented above under the "Functional History" and "Functional Status" headings.  Judging by the patient's diagnosis, physical exam, and functional history, the patient has potential for functional progress which will result in measurable gains while on inpatient rehab.  These gains will be of substantial and practical use upon discharge  in facilitating mobility and self-care at the household level. 89. Physiatrist will provide 24 hour management of medical needs as well as oversight of the therapy plan/treatment and provide guidance as appropriate regarding the interaction of the two. 7. 24 hour rehab nursing will assist with safety, disease management and patient education  and help integrate therapy concepts, techniques,education, etc. 8. PT will assess and treat for/with: Lower extremity strength, range of motion, stamina, balance, functional mobility, safety, adaptive techniques and equipment, coping skills, pain control, stroke education.   Goals are: Mod I/Supervison. 9. OT will assess and treat for/with: ADL's, functional mobility, safety, upper extremity strength, adaptive techniques  and equipment, ego support, and community reintegration.   Goals are: Mod I/Supervision. Therapy may proceed with showering this patient. 10. Case Management and Social Worker will assess and treat for  psychological issues and discharge planning. 11. Team conference will be held weekly to assess progress toward goals and to determine barriers to discharge. 12. Patient will receive at least 3 hours of therapy per day at least 5 days per week. 13. ELOS: 9-14 days.       14. Prognosis:  good  Delice Lesch, MD, ABPMR Lavon Paganini Angiulli, PA-C 10/24/2017

## 2017-10-24 NOTE — Progress Notes (Signed)
PROGRESS NOTE    James Pugh  ZWC:[id number] DOB: 1945-09-30 DOA: 10/22/2017 PCP: [name], MD   Chief Complaint  Patient presents with  . Weakness    left side    Brief Narrative:  HPI on 10/22/2017 by Dr. Brigid Re Purohit James Pugh is a 72 y.o. male with medical history significant of hyperlipidemia who comes in with left-sided weakness.  In brief yesterday patient noted to have some dysarthria, left-sided weakness including arm and leg along with numbness and tingling.  Patient went to sleep as he was hoping this would improve however this did not improve in the morning.  He is still able to ambulate and walk.  He continues to have numbness and tingling.  He reports his weakness is getting better.  He called his daughter who is a Marine scientist and was brought into EMS.  He denies any chest pain, palpitations, nausea, vomiting, abdominal pain, diarrhea, rash, prior stroke.  Interim history Admitted for left arm and leg weakness.  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  PT OT consulted recommending inpatient rehab.  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  Follow-up with Dr. Leonie Man in 6 weeks -LDL 142, hemoglobin A1c 6.9 -Continue Plavix, statin, aspirin -PT, OT consulted recommended CIR -Inpatient rehab consulted  Hyperlipidemia -Lipid panel shows TC 196, LDL 142, HDL 28, triglycerides 131 -Placed on statin  Possible obstructive sleep apnea -Per family, patient has had apneic episodes. -Case management consulted for  outpatient sleep study.  However if patient is accepted to inpatient rehab, this will have to be arranged at a later time.  DVT Prophylaxis Lovenox  Code Status: Full  Family Communication: Family at bedside  Disposition Plan: Admitted. Pending CIR  Consultants Neurology CIR  Procedures  Echocardiogram  Antibiotics   Anti-infectives (From admission, onward)   None      Subjective:   James Pugh seen and examined today.  Continues to have left-sided weakness but feels he is getting stronger.  Able to move his arm more today.  Denies current chest pain, shortness of breath, abdominal pain, nausea vomiting, diarrhea constipation, headache or dizziness.  Objective:   Vitals:   10/23/17 2100 10/24/17 0117 10/24/17 0526 10/24/17 1028  BP: (!) 162/61 (!) 163/83 (!) 160/72   Pulse: (!) 58 65 (!) 57 (!) 58  Resp: 18 18  19   Temp: 98 F (36.7 C) 98.1 F (36.7 C) 97.8 F (36.6 C) 98.2 F (36.8 C)  TempSrc: Oral Oral Oral Oral  SpO2: 97% 96% 100% 99%  Weight:      Height:        Intake/Output Summary (Last 24 hours) at 10/24/2017 1047 Last data filed at 10/23/2017 1900 Gross per 24 hour  Intake -  Output 375 ml  Net -375 ml   Filed Weights   10/22/17 0943  Weight: 76.2 kg (168 lb)    Exam  General: Well developed, well nourished, NAD, appears stated age  HEENT: NCAT, mucous membranes moist.   Cardiovascular: S1 S2 auscultated, RRR, no murmur  Respiratory: Clear to auscultation bilaterally, no wheezing  Abdomen: Soft, nontender, nondistended, + bowel sounds  Extremities: warm dry without cyanosis clubbing or edema  Neuro: AAOx3, left-sided facial weakness with decreased sensation.  4 out of 5 left upper and lower extremity strength- improving, otherwise nonfocal  Psych: appropriate mood and affect, pleasant   Data Reviewed: I have personally reviewed following labs and imaging studies  CBC: Recent Labs  Lab 10/22/17 0943  WBC 6.8  HGB 14.2  HCT  42.5  MCV 87.6  PLT 580   Basic Metabolic Panel: Recent Labs  Lab 10/22/17 0943  NA 137  K 4.2  CL 102  CO2 25  GLUCOSE 138*  BUN 11  CREATININE 0.99  CALCIUM 9.4   GFR: Estimated Creatinine Clearance: 60 mL/min (by C-G formula based on SCr of 0.99 mg/dL). Liver Function Tests: No results for input(s): AST, ALT, ALKPHOS, BILITOT, PROT, ALBUMIN in the last 168 hours. No results for input(s): LIPASE, AMYLASE in the last 168 hours. No results for input(s): AMMONIA in the last 168 hours. Coagulation Profile: No results for input(s): INR, PROTIME in the last 168 hours. Cardiac Enzymes: Recent Labs  Lab 10/22/17 1000  TROPONINI <0.03   BNP (last 3 results) No results for input(s): PROBNP in the last 8760 hours. HbA1C: Recent Labs    10/23/17 0419  HGBA1C 6.9*   CBG: Recent Labs  Lab 10/22/17 1249  GLUCAP 108*   Lipid Profile: Recent Labs    10/23/17 0419  CHOL 196  HDL 28*  LDLCALC 142*  TRIG 131  CHOLHDL 7.0   Thyroid Function Tests: No results for input(s): TSH, T4TOTAL, FREET4, T3FREE, THYROIDAB in the last 72 hours. Anemia Panel: No results for input(s): VITAMINB12, FOLATE, FERRITIN, TIBC, IRON, RETICCTPCT in the last 72 hours. Urine analysis:    Component Value Date/Time   COLORURINE YELLOW 10/22/2017 0945   APPEARANCEUR HAZY (A) 10/22/2017 0945   LABSPEC 1.018 10/22/2017 0945   PHURINE 6.0 10/22/2017 0945   GLUCOSEU NEGATIVE 10/22/2017 0945   HGBUR NEGATIVE 10/22/2017 0945   HGBUR negative 07/06/2010 0837   BILIRUBINUR NEGATIVE 10/22/2017 0945   BILIRUBINUR negative 11/04/2014 1116   KETONESUR 20 (A) 10/22/2017 0945   PROTEINUR NEGATIVE 10/22/2017 0945   UROBILINOGEN 0.2 11/04/2014 1116   UROBILINOGEN 0.2 07/06/2010 0837   NITRITE NEGATIVE 10/22/2017 0945   LEUKOCYTESUR NEGATIVE 10/22/2017 0945   Sepsis Labs: @LABRCNTIP (procalcitonin:4,lacticidven:4)  )No results found for this or any previous visit (from the past 240 hour(s)).     Radiology Studies: Ct Head Wo Contrast  Result Date: 10/22/2017 CLINICAL DATA:  Left-sided numbness and tingling since 10 a.m. today. EXAM: CT HEAD WITHOUT CONTRAST TECHNIQUE: Contiguous axial images were obtained from the base of the skull through the vertex without intravenous contrast. COMPARISON:  None. FINDINGS: Brain: No evidence of acute infarction, hemorrhage, hydrocephalus, extra-axial collection or mass lesion/mass effect. Mild patchy is likely chronic small vessel ischemia low-density in the cerebral white matter that. Normal brain volume. Vascular: Atherosclerotic calcification.  No hyperdense vessel. Skull: Normal. Negative for fracture or focal lesion. Sinuses/Orbits: Bilateral cataract resection.  No acute finding. IMPRESSION: No acute finding or explanation for symptoms. Electronically Signed   By: Monte Fantasia M.D.   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 Dr. Leonie Man is aware and first noted this finding. Electronically Signed   By: Monte Fantasia M.D.   On: 10/23/2017 08:19  Result Date: 10/23/2017 CLINICAL DATA:  Focal neuro deficit for greater than 6 hours, stroke suspected. New onset left upper and lower extremity weakness beginning at 9 p.m. last night. EXAM: MRI HEAD WITHOUT CONTRAST MRA HEAD WITHOUT CONTRAST MRA NECK WITHOUT AND WITH CONTRAST TECHNIQUE: Multiplanar, multiecho pulse sequences of the brain and surrounding structures were obtained without intravenous contrast. Angiographic images of the Circle of Willis were obtained using MRA technique without intravenous contrast. Angiographic images of the neck were obtained using MRA technique without and with intravenous contrast. Carotid stenosis measurements (when applicable) are obtained utilizing NASCET criteria, using the distal internal carotid diameter as the denominator. 89mL  MULTIHANCE GADOBENATE DIMEGLUMINE 529 MG/ML IV SOLN COMPARISON:  CT head without contrast from the same day. FINDINGS: MRI HEAD FINDINGS Brain: Diffusion-weighted images demonstrate no acute or subacute infarction. Scattered periventricular and subcortical T2 hyperintensities bilaterally are mildly advanced for age. No acute hemorrhage or mass lesion is present. Ventricles are of normal size. No significant extra-axial fluid collection is present. The internal auditory canals are within normal limits bilaterally. The brainstem and cerebellum are normal. Vascular: Flow is present in the major intracranial arteries. Skull and upper cervical spine: The craniocervical junction is normal. Midline sagittal structures are unremarkable. Marrow signal is normal. Sinuses/Orbits: The paranasal sinuses are clear. There is some fluid in left mastoid air cells. No obstructing nasopharyngeal lesion is present. Bilateral lens replacements are present. Globes and orbits are within normal limits. Other: MRA HEAD FINDINGS The time-of-flight images demonstrate a normal appearance of the internal carotid arteries from the high cervical segments through the ICA termini bilaterally. The A1 and M1 segments are normal. The anterior communicating artery is patent. MCA bifurcations are normal. The ACA and MCA branch vessels are intact. The left vertebral artery is slightly dominant to the right. PICA origins are visualized and normal. The vertebrobasilar junction is normal. The basilar artery is normal. Both posterior cerebral arteries originate from basilar tip. There is some attenuation of distal PCA branch vessels bilaterally without a significant proximal stenosis or occlusion. MRA NECK FINDINGS The time-of-flight images demonstrate no significant flow disturbance at either carotid bifurcation. Flow is antegrade in the vertebral arteries bilaterally. The postcontrast images demonstrate a 3 vessel arch configuration. The common carotid  arteries are within normal limits bilaterally. The right MCA scratched at the right carotid bifurcation is normal. The right cervical ICA is normal. Atherosclerotic changes are present at the left carotid bifurcation with 50% narrowing relative to the more distal vessel. No tandem stenoses are present. The left vertebral artery is slightly dominant to the right. There is a moderate stenosis of the left vertebral artery origin without other tandem stenosis. There is no significant stenosis at the origin of the right vertebral artery. IMPRESSION: 1. No acute or focal abnormality to explain the patient's symptoms. No acute or subacute infarct. 2. Atrophy and white matter changes are mildly advanced for age. 3. Normal MRA circle of will is without significant proximal stenosis, aneurysm, or branch vessel occlusion. 4. 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 5. 50% stenosis at the left carotid bifurcation without a significant stenosis of greater than 50% in the anterior circulation on either side. Electronically Signed: By: San Morelle M.D. 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 Dr. Leonie Man is aware and first noted this finding. Electronically Signed  By: Monte Fantasia M.D.   On: 10/23/2017 08:19   Result Date: 10/23/2017 CLINICAL DATA:  Focal neuro deficit for greater than 6 hours, stroke suspected. New onset left upper and lower extremity weakness beginning at 9 p.m. last night. EXAM: MRI HEAD WITHOUT CONTRAST MRA HEAD WITHOUT CONTRAST MRA NECK WITHOUT AND WITH CONTRAST TECHNIQUE: Multiplanar, multiecho pulse sequences of the brain and surrounding structures were obtained without intravenous contrast. Angiographic images of the Circle of Willis were obtained using MRA technique without intravenous  contrast. Angiographic images of the neck were obtained using MRA technique without and with intravenous contrast. Carotid stenosis measurements (when applicable) are obtained utilizing NASCET criteria, using the distal internal carotid diameter as the denominator. 46mL MULTIHANCE GADOBENATE DIMEGLUMINE 529 MG/ML IV SOLN COMPARISON:  CT head without contrast from the same day. FINDINGS: MRI HEAD FINDINGS Brain: Diffusion-weighted images demonstrate no acute or subacute infarction. Scattered periventricular and subcortical T2 hyperintensities bilaterally are mildly advanced for age. No acute hemorrhage or mass lesion is present. Ventricles are of normal size. No significant extra-axial fluid collection is present. The internal auditory canals are within normal limits bilaterally. The brainstem and cerebellum are normal. Vascular: Flow is present in the major intracranial arteries. Skull and upper cervical spine: The craniocervical junction is normal. Midline sagittal structures are unremarkable. Marrow signal is normal. Sinuses/Orbits: The paranasal sinuses are clear. There is some fluid in left mastoid air cells. No obstructing nasopharyngeal lesion is present. Bilateral lens replacements are present. Globes and orbits are within normal limits. Other: MRA HEAD FINDINGS The time-of-flight images demonstrate a normal appearance of the internal carotid arteries from the high cervical segments through the ICA termini bilaterally. The A1 and M1 segments are normal. The anterior communicating artery is patent. MCA bifurcations are normal. The ACA and MCA branch vessels are intact. The left vertebral artery is slightly dominant to the right. PICA origins are visualized and normal. The vertebrobasilar junction is normal. The basilar artery is normal. Both posterior cerebral arteries originate from basilar tip. There is some attenuation of distal PCA branch vessels bilaterally without a significant proximal stenosis or  occlusion. MRA NECK FINDINGS The time-of-flight images demonstrate no significant flow disturbance at either carotid bifurcation. Flow is antegrade in the vertebral arteries bilaterally. The postcontrast images demonstrate a 3 vessel arch configuration. The common carotid arteries are within normal limits bilaterally. The right MCA scratched at the right carotid bifurcation is normal. The right cervical ICA is normal. Atherosclerotic changes are present at the left carotid bifurcation with 50% narrowing relative to the more distal vessel. No tandem stenoses are present. The left vertebral artery is slightly dominant to the right. There is a moderate stenosis of the left vertebral artery origin without other tandem stenosis. There is no significant stenosis at the origin of the right vertebral artery. IMPRESSION: 1. No acute or focal abnormality to explain the patient's symptoms. No acute or subacute infarct. 2. Atrophy and white matter changes are mildly advanced for age. 3. Normal MRA circle of will is without significant proximal stenosis, aneurysm, or branch vessel occlusion. 4. 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 5. 50% stenosis at the left carotid bifurcation without a significant stenosis of greater than 50% in the anterior circulation on either side. Electronically Signed: By: San Morelle M.D. On: 10/22/2017 17:51   Mr Jodene Nam Head Wo Contrast  Addendum Date: 10/23/2017   ADDENDUM REPORT: 10/23/2017 08:19 ADDENDUM: Acute perforator infarct in the lower  right pons. Dr. Leonie Man is aware and first noted this finding. Electronically Signed   By: Monte Fantasia M.D.   On: 10/23/2017 08:19   Result Date: 10/23/2017 CLINICAL DATA:  Focal neuro deficit for greater than 6 hours, stroke suspected. New onset left upper and lower extremity weakness beginning at 9 p.m. last night. EXAM: MRI HEAD WITHOUT CONTRAST MRA HEAD WITHOUT CONTRAST  MRA NECK WITHOUT AND WITH CONTRAST TECHNIQUE: Multiplanar, multiecho pulse sequences of the brain and surrounding structures were obtained without intravenous contrast. Angiographic images of the Circle of Willis were obtained using MRA technique without intravenous contrast. Angiographic images of the neck were obtained using MRA technique without and with intravenous contrast. Carotid stenosis measurements (when applicable) are obtained utilizing NASCET criteria, using the distal internal carotid diameter as the denominator. 7mL MULTIHANCE GADOBENATE DIMEGLUMINE 529 MG/ML IV SOLN COMPARISON:  CT head without contrast from the same day. FINDINGS: MRI HEAD FINDINGS Brain: Diffusion-weighted images demonstrate no acute or subacute infarction. Scattered periventricular and subcortical T2 hyperintensities bilaterally are mildly advanced for age. No acute hemorrhage or mass lesion is present. Ventricles are of normal size. No significant extra-axial fluid collection is present. The internal auditory canals are within normal limits bilaterally. The brainstem and cerebellum are normal. Vascular: Flow is present in the major intracranial arteries. Skull and upper cervical spine: The craniocervical junction is normal. Midline sagittal structures are unremarkable. Marrow signal is normal. Sinuses/Orbits: The paranasal sinuses are clear. There is some fluid in left mastoid air cells. No obstructing nasopharyngeal lesion is present. Bilateral lens replacements are present. Globes and orbits are within normal limits. Other: MRA HEAD FINDINGS The time-of-flight images demonstrate a normal appearance of the internal carotid arteries from the high cervical segments through the ICA termini bilaterally. The A1 and M1 segments are normal. The anterior communicating artery is patent. MCA bifurcations are normal. The ACA and MCA branch vessels are intact. The left vertebral artery is slightly dominant to the right. PICA origins are  visualized and normal. The vertebrobasilar junction is normal. The basilar artery is normal. Both posterior cerebral arteries originate from basilar tip. There is some attenuation of distal PCA branch vessels bilaterally without a significant proximal stenosis or occlusion. MRA NECK FINDINGS The time-of-flight images demonstrate no significant flow disturbance at either carotid bifurcation. Flow is antegrade in the vertebral arteries bilaterally. The postcontrast images demonstrate a 3 vessel arch configuration. The common carotid arteries are within normal limits bilaterally. The right MCA scratched at the right carotid bifurcation is normal. The right cervical ICA is normal. Atherosclerotic changes are present at the left carotid bifurcation with 50% narrowing relative to the more distal vessel. No tandem stenoses are present. The left vertebral artery is slightly dominant to the right. There is a moderate stenosis of the left vertebral artery origin without other tandem stenosis. There is no significant stenosis at the origin of the right vertebral artery. IMPRESSION: 1. No acute or focal abnormality to explain the patient's symptoms. No acute or subacute infarct. 2. Atrophy and white matter changes are mildly advanced for age. 3. Normal MRA circle of will is without significant proximal stenosis, aneurysm, or branch vessel occlusion. 4. 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 5. 50% stenosis at the left carotid bifurcation without a significant stenosis of greater than 50% in the anterior circulation on either side. Electronically Signed: By: San Morelle M.D. On: 10/22/2017 17:51     Scheduled Meds: . aspirin EC  81 mg Oral Daily  . atorvastatin  80 mg Oral q1800  . clopidogrel  75 mg Oral Daily  . docusate sodium  100 mg Oral BID  . enoxaparin (LOVENOX) injection  40 mg Subcutaneous Q24H   Continuous Infusions: . sodium  chloride 100 mL/hr at 10/24/17 0342     LOS: 1 day   Time Spent in minutes   30 minutes  Berneta Sconyers D.O. on 10/24/2017 at 10:47 AM  Between 7am to 7pm - Pager - [phone]  After 7pm go to [email or web address] - password [email or web address]  And look for the night coverage person covering for me after hours  [facility] Office  [phone]

## 2017-10-24 NOTE — Consult Note (Signed)
[facility] CM Primary Care Navigator  10/24/2017  James Pugh 17-Jul-1946 [id number]   Met with patientand wife James Pugh) at the bedside to identify possible discharge needs.  Patient reports that he had "dizziness, left-sided weakness (leg/ arm) and instability of gait" that had led tothis admission.   Patient endorsesDr. Carolann Littler with [facility] as his primary care provider.    Patient states using [facility] in [place] obtain medications without any problem.  Patient is managing his own medications at home straight out of the containers.   Patient verbalized that he was driving prior to admission but wife will be providing transportation to his doctors'appointments after discharge.  Patient's wife will be the primary caregiver at home when discharged, as stated.  [facility], prior to returning home.   Patient and wife voiced understanding to call primary care provider's office when he returns back home, for a post discharge follow-up within1-2weeksor sooner if needs arise. Patient letter (with PCP's contact number) was provided astheirreminder.   Discussed with patient and wife regarding [facility] CM services available for health management at homebut both denied any needs or concerns at this time. Patient and wife expressed understandingto seek referral from primary care provider to [facility] care management if deemed necessary and appropriate for services in the future.   [facility] care management information was provided for future needs that patient may have.  Noted order for EMMI Stroke calls already in place for patient.    For additional questions please contact:  Edwena Felty A. Shayra Anton, BSN, RN-BC [facility] PRIMARY CARE Navigator Cell: [phone]

## 2017-10-24 NOTE — Care Management Note (Signed)
Case Management Note  Patient Details  Name: James Pugh MRN: [id number] Date of Birth: 12-May-1946  Subjective/Objective:                    Action/Plan: Pt discharging to CIR. CM consulted for outpt sleep study. CM unable to arrange d/t unsure of d/c date from CIR. No further needs per CM.   Expected Discharge Date:  10/24/17               Expected Discharge Plan:  [place]  In-House Referral:     Discharge planning Services  CM Consult  Post Acute Care Choice:    Choice offered to:     DME Arranged:    DME Agency:     HH Arranged:    HH Agency:     Status of Service:  Completed, signed off  If discussed at [employer], dates discussed:    Additional Comments:  Pollie Friar, RN 10/24/2017, 3:18 PM

## 2017-10-24 NOTE — PMR Pre-admission (Signed)
PMR Admission Coordinator Pre-Admission Assessment  Patient: James Pugh is an 72 y.o., male MRN: [id number] DOB: 1946-08-03 Height: 5\' 6"  (167.6 cm) Weight: 76.2 kg (168 lb)              Insurance Information HMO:     PPO:      PCP:      IPA:      80/20:      OTHER:  PRIMARY: Medicare A & B      Policy#: [id number]      Subscriber: Self CM Name:       Phone#:      Fax#:  Pre-Cert#: Eligible       Employer: Retired  Benefits:  Phone #: Verified online     Name: [employer]  Eff. Date: 05/14/09     Deduct: $1364      Out of Pocket Max: N/A      Life Max: N/A CIR: 100%      SNF: 100% days 1-20; 80% days 21-100 Outpatient: 80%     Co-Pay: 20% Home Health: 100%      Co-Pay: $0 DME: 80%     Co-Pay: 20% Providers: Patient's choice   SECOND[place]: [place]       Policy#: [id number]      Subscriber: Self CM Name:       Phone#:      Fax#:  Pre-Cert#:       Employer: Retired  Benefits:  Phone #: [phone]     Name:  Eff. 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 Independent and active prior to admission. One level home.Presented 10/22/2017 with left-sided weakness and mild dysarthria. There was reports the patient had struck his head 4 days prior on a beam in his crawl space. There was no loss of consciousness. Cranial CT reviewed,  unremarkable for acute intracranial process.Patient did not receive TPA. MRI showed no acute or focal abnormality. MRA without significant proximal stenosis,aneurysm or branch vessel occlusion.Echocardiogram with ejection fraction of 60% no wall motion abnormalities.Neurology consulted suspect acute perforator infarct in the lower right pons. Currently maintained on aspirin and Plavix for CVA prophylaxis. Subcutaneous Lovenox for DVT prophylaxis. Tolerating a regular diet. Physical and occupational therapy evaluations completed with recommendations of physical medicine rehabilitation consult. Patient was admitted for a comprehensive rehabilitation program 10/24/17. (See consult note from today)  NIH Total: 6    Past Medical History  Past Medical History:  Diagnosis Date  . CERUMEN IMPACTION 08/22/2009  . HYPERLIPIDEMIA 06/06/2009  . PREDIABETES 07/13/2010    Family History  family history includes Alcohol abuse in his other; Diabetes in his  father; Heart disease in his father; Stroke in his father.  Prior Rehab/Hospitalizations:  Has the patient had major surgery during 100 days prior to admission? No  Current Medications   Current Facility-Administered Medications:  .  0.9 %  sodium chloride infusion, , Intravenous, Continuous, Amie Portland, MD, Last Rate: 100 mL/hr at 10/24/17 0342 .  acetaminophen (TYLENOL) tablet 650 mg, 650 mg, Oral, Q4H PRN **OR** acetaminophen (TYLENOL) solution 650 mg, 650 mg, Per Tube, Q4H PRN **OR** acetaminophen (TYLENOL) suppository 650 mg, 650 mg, Rectal, Q4H PRN, Purohit, Shrey C, MD .  aspirin EC tablet 81 mg, 81 mg, Oral, Daily, Rinehuls, David L, PA-C, 81 mg at 10/24/17 1027 .  atorvastatin (LIPITOR) tablet 80 mg, 80 mg, Oral, q1800, Amankwah, Pearl, NP, 80 mg at 10/23/17 1820 .  clopidogrel (PLAVIX) tablet 75 mg, 75 mg, Oral, Daily, Greta Doom, MD, 75 mg at 10/24/17 1027 .  docusate sodium (COLACE) capsule 100 mg, 100 mg, Oral, BID,  Mikhail, Florence, DO, 100 mg at 10/24/17 1027 .  enoxaparin (LOVENOX) injection 40 mg, 40 mg, Subcutaneous, Q24H, Purohit, Shrey C, MD, 40 mg at 10/23/17 2132 .  senna-docusate (Senokot-S) tablet 1 tablet, 1 tablet, Oral, QHS PRN, Purohit, Konrad Dolores, MD  Patients Current Diet: Fall precautions Diet 2 gram sodium Room service appropriate? Yes; Fluid consistency: Thin Fall precautions  Precautions / Restrictions Precautions Precautions: Fall Restrictions Weight Bearing Restrictions: No   Has the patient had 2 or more falls or a fall with injury in the past year?No  Prior Activity Level Community (5-7x/wk): Prior to admission patient was fully independent and active.  He enjoyed boating, fishing, yard and house work.    Home Assistive Devices / Equipment Home Assistive Devices/Equipment: None Home Equipment: Shower seat  Prior Device Use: Indicate devices/aids used by the patient prior to current illness, exacerbation or injury? None of the above  Prior Functional Level Prior Function Level of Independence: Independent Comments: active   Self Care: Did the patient need help bathing, dressing, using the toilet or eating? Independent  Indoor Mobility: Did the patient need assistance with walking from room to room (with or without device)? Independent  Stairs: Did the patient need assistance with internal or external stairs (with or without device)? Independent  Functional Cognition: Did the patient need help planning regular tasks such as shopping or remembering to take medications? Independent  Current Functional Level Cognition  Arousal/Alertness: Awake/alert Overall Cognitive Status: Within Functional Limits for tasks assessed Orientation Level: Oriented X4 Attention: Selective Selective Attention: Appears intact Memory: Appears intact Awareness: Appears intact Problem Solving: Appears intact    Extremity Assessment (includes Sensation/Coordination)  Upper Extremity  Assessment: Defer to OT evaluation LUE Deficits / Details: grossly 2/5 stronger proximal than distal. Sensation intact with exception of numb/tingling in digits. Thumb opposition impaired.  LUE Coordination: decreased fine motor, decreased gross motor  Lower Extremity Assessment: LLE deficits/detail LLE Deficits / Details: c/o tingling L foot; 4-/5 flexors grossly graded LLE Sensation: decreased proprioception    ADLs  Overall ADL's : Needs assistance/impaired Eating/Feeding: Set up, Sitting Grooming: Oral care, Wash/dry face, Minimal assistance, Standing Upper Body Bathing: Moderate assistance, Sitting Lower Body Bathing: Moderate assistance, Sit to/from stand Upper Body Dressing : Moderate assistance, Sitting Lower Body Dressing: Moderate assistance, Sit to/from stand Toilet Transfer: Minimal assistance, Ambulation, RW Toileting- Clothing Manipulation and Hygiene: Minimal assistance, Sit to/from stand Tub/ Shower Transfer: Walk-in shower, Minimal assistance, Ambulation, 3 in 1, Rolling walker Functional mobility during ADLs: Minimal assistance, Rolling  walker General ADL Comments: Pt completing room level functional mobility and standing grooming ADLs; additional focus on LUE AROM and fine motor HEP with handouts provided; education provided/questions answered regarding CIR process as post-op therapy     Mobility  Overal bed mobility: Needs Assistance Bed Mobility: Supine to Sit Supine to sit: Supervision, HOB elevated General bed mobility comments: No assist needed, use of rail for support.     Transfers  Overall transfer level: Needs assistance Equipment used: None Transfers: Sit to/from Stand Sit to Stand: Min guard General transfer comment: Min guard for safety. Cues to place equal WB through BLEs to stand and slow descent. Stood from Adult nurse.    Ambulation / Gait / Stairs / Wheelchair Mobility  Ambulation/Gait Ambulation/Gait assistance: Museum/gallery curator  (Feet): 100 Feet(+ 50') Assistive device: None Gait Pattern/deviations: Step-through pattern, Decreased stride length, Decreased dorsiflexion - left, Steppage General Gait Details: Pt with decreased foot clearance LLE using steppage gait pattern to advance LLE, encouraged arm swing bilaterally. Left knee instability noted as well. 1 seated rest break. Min A for balance. Incoordination noted LLE. Gait velocity: decreased Gait velocity interpretation: at or above normal speed for age/gender    Posture / Balance Dynamic Sitting Balance Sitting balance - Comments: Able to reach outside BoS and donn/doff socks with Min A to bring LLE up x2. Encouraged use of LUE as well.  Balance Overall balance assessment: Needs assistance Sitting-balance support: Feet supported, No upper extremity supported Sitting balance-Leahy Scale: Good Sitting balance - Comments: Able to reach outside BoS and donn/doff socks with Min A to bring LLE up x2. Encouraged use of LUE as well.  Standing balance support: During functional activity Standing balance-Leahy Scale: Fair Standing balance comment: able to maintain static standing briefly with Min guard, requires Min A for dynamic standing.     Special needs/care consideration BiPAP/CPAP: No CPM: No Continuous Drip IV: No Dialysis: No         Life Vest: No Oxygen: No Special Bed: No Trach Size: No Wound Vac (area): No       Skin: WDL                                Bowel mgmt: Continent, last BM 10/24/17 Bladder mgmt: Continent  Diabetic mgmt: Prediabetic, HgbA1c 6.9, goal <7.0     Previous Home Environment Living Arrangements: Spouse/significant other  Lives With: Spouse Available Help at Discharge: Family, Available 24 hours/day Type of Home: House Home Layout: One level Home Access: Level entry Bathroom Shower/Tub: Multimedia programmer: Jay: No  Discharge Living Setting Plans for Discharge Living Setting: Patient's  home, Lives with (comment)(Spouse) Type of Home at Discharge: House Discharge Home Layout: One level Discharge Home Access: Stairs to enter Entrance Stairs-Rails: None Entrance Stairs-Number of Steps: 2 Discharge Bathroom Shower/Tub: Walk-in shower Discharge Bathroom Toilet: Standard Discharge Bathroom Accessibility: Yes How Accessible: Accessible via walker Does the patient have any problems obtaining your medications?: No  Social/Family/Support Systems Patient Roles: Spouse, Parent Contact Information: Ria Comment: [phone] Anticipated Caregiver: Spouse Anticipated Caregiver's Contact Information: see above  Ability/Limitations of Caregiver: None Caregiver Availability: 24/7 Discharge Plan Discussed with Primary Caregiver: Yes Is Caregiver In Agreement with Plan?: Yes Does Caregiver/Family have Issues with Lodging/Transportation while Pt is in Rehab?: No  Goals/Additional Needs Patient/Family Goal for Rehab: PT/OT: Supervision-Mod I Expected length of stay: 9-14 days  Cultural Considerations: [place], [place]  Dietary Needs: Low sodium, 2 grams  Equipment Needs: TBD Pt/Family Agrees to Admission and willing to participate: Yes Program Orientation Provided & Reviewed with Pt/Caregiver Including Roles  & Responsibilities: Yes Additional Information Needs: Sleep study as outpatient after IP Rehab  Information Needs to be Provided By: Team FYI  Decrease burden of Care through IP rehab admission: No  Possible need for SNF placement upon discharge: No  Patient Condition: This patient's condition remains as documented in the consult dated 10/24/17, in which the Rehabilitation Physician determined and documented that the patient's condition is appropriate for intensive rehabilitative care in an inpatient rehabilitation facility. Will admit to inpatient rehab today.  Preadmission Screen Completed By:  Gunnar Fusi, 10/24/2017 3:46  PM ______________________________________________________________________   Discussed status with Dr. Posey Pronto on 10/24/17 at 1550 and received telephone approval for admission today.  Admission Coordinator:  Gunnar Fusi, time 1550/Date 10/24/17

## 2017-10-24 NOTE — Progress Notes (Signed)
Report called to Rehab RN; patient transferred via wheelchair.

## 2017-10-24 NOTE — IPOC Note (Addendum)
Overall Plan of Care [facility]) Patient Details Name: James Pugh MRN: [id number] DOB: Nov 26, 1945  Admitting Diagnosis: Right pontine CVA  Hospital Problems: Active Problems:   Right pontine cerebrovascular accident [facility])   Acute blood loss anemia   Hypoalbuminemia due to protein-calorie malnutrition (HCC)   Reactive hypertension     Functional Problem List: Nursing Motor, Medication Management, Endurance, Perception, Safety  PT Balance, Behavior, Endurance, Motor, Safety  OT Balance, Safety, Sensory, Edema, Endurance, Motor  SLP    TR         Basic ADL's: OT Grooming, Bathing, Dressing, Toileting     Advanced  ADL's: OT       Transfers: PT Bed Mobility, Bed to Chair, Car, [employer], Futures trader, Metallurgist: PT Ambulation, Emergency planning/management officer, Stairs     Additional Impairments: OT Fuctional Use of Upper Extremity  SLP        TR      Anticipated Outcomes Item Anticipated Outcome  Self Feeding n/a  Swallowing      Basic self-care  mod I   Toileting  mod I    Bathroom Transfers mod I   Bowel/Bladder  Mod I  Transfers  Mod I  Locomotion  supervision   Communication     Cognition     Pain  2 or less  Safety/Judgment  Mod I   Therapy Plan: PT Intensity: Minimum of 1-2 x/day ,45 to 90 minutes PT Frequency: 5 out of 7 days PT Duration Estimated Length of Stay: 7-9 days OT Intensity: Minimum of 1-2 x/day, 45 to 90 minutes OT Frequency: 5 out of 7 days OT Duration/Estimated Length of Stay: ~10-12 days      Team Interventions: Nursing Interventions Patient/Family Education, Disease Management/Prevention, Medication Management, Cognitive Remediation/Compensation  PT interventions Ambulation/gait training, Neuromuscular re-education, Stair training, UE/LE Strength taining/ROM, Wheelchair propulsion/positioning, Training and development officer, Discharge planning, Therapeutic Activities, UE/LE Coordination activities, Cognitive  remediation/compensation, Therapeutic Exercise, Functional mobility training, Patient/family education, Community reintegration, Engineer, drilling  OT Interventions Training and development officer, Discharge planning, Functional electrical stimulation, Pain management, Self Care/advanced ADL retraining, Therapeutic Activities, UE/LE Coordination activities, Visual/perceptual remediation/compensation, Therapeutic Exercise, Skin care/wound managment, Patient/family education, Functional mobility training, Disease mangement/prevention, Academic librarian, Engineer, drilling, Neuromuscular re-education, Psychosocial support, Splinting/orthotics, UE/LE Strength taining/ROM, Wheelchair propulsion/positioning  SLP Interventions    TR Interventions    SW/CM Interventions Discharge Planning, Psychosocial Support, Patient/Family Education   Barriers to Discharge MD  Medical stability  Nursing      PT      OT      SLP      SW       Team Discharge Planning: Destination: PT-Home ,OT- Home , SLP-  Projected Follow-up: PT-Outpatient PT, OT-  Outpatient OT, SLP-  Projected Equipment Needs: PT-To be determined, OT-TBD  , SLP-  Equipment Details: PT- , OT-  Patient/family involved in discharge planning: PT- Patient, Family member/caregiver,  OT-Patient, Family member/caregiver, SLP-   MD ELOS: 6-10 days. Medical Rehab Prognosis:  Good Assessment: 72 y.o.right handed malewith history of hyperlipidemia, prediabeticand recent cataract surgery.Presented 10/22/2017 with left-sided weakness and mild dysarthria. There was reports the patient had struck his head 4 days prior on a beam in his crawl space. There was no loss of consciousness. Cranial CT reviewed, unremarkable for acute intracranial process.Patient did not receive TPA. MRI showed no acute or focal abnormality. MRA without significant proximal stenosis,aneurysm or branch vessel occlusion.Echocardiogram with  ejection fraction of 60% no wall motion abnormalities.Neurology consulted  suspect acute perforator infarct in the lower right pons. Currently maintained on aspirin and Plavix for CVA prophylaxis. Tolerating a regular diet. Patient with resulting deficits with mobility,self-care, speech.  Will set goals for Mod I with PT/OT.     See Team Conference Notes for weekly updates to the plan of care

## 2017-10-24 NOTE — Progress Notes (Signed)
Occupational Therapy Treatment Patient Details Name: James Pugh MRN: [id number] DOB: 04/05/1946 Today's Date: 10/24/2017    History of present illness 72 y.o. male with medical history significant of hyperlipidemia who comes in with left-sided weakness. No evidence of stroke on MRI. Symptoms are waxing/waning, stuttering TIA suspected.    OT comments  Pt making steady progressing towards goals, presenting supine in bed willing and motivated to work with therapy. Pt completing room level functional mobility using RW with MinA this session; continues to demonstrate LUE/LLE weakness and decreased coordination. Began education and initiated HEP for increasing UE ROM, strength, and [place]. Feel Pt remains an excellent candidate for CIR level therapies after discharge to maximize his safety and independence with ADLs and mobility. Will continue to follow acutely to progress Pt towards established OT goals.    Follow Up Recommendations  CIR    Equipment Recommendations  Other (comment)(defer to next venue )          Precautions / Restrictions Precautions Precautions: Fall Restrictions Weight Bearing Restrictions: No       Mobility Bed Mobility Overal bed mobility: Needs Assistance Bed Mobility: Supine to Sit     Supine to sit: Min guard;HOB elevated     General bed mobility comments: HOB elevated and minguard for safety   Transfers Overall transfer level: Needs assistance Equipment used: Rolling walker (2 wheeled);None Transfers: Sit to/from Stand Sit to Stand: Min assist         General transfer comment: verbal cues for hand placement, assist to power up and steady; Pt performing additional sit<>stand practice from EOB without device x2 with MinA to rise and steady     Balance Overall balance assessment: Needs assistance Sitting-balance support: No upper extremity supported;Feet supported Sitting balance-Leahy Scale: Good Sitting balance - Comments: static sitting EOB     Standing balance support: Bilateral upper extremity supported;During functional activity;Single extremity supported Standing balance-Leahy Scale: Fair Standing balance comment: able to maintain static standing briefly with Minguard                            ADL either performed or assessed with clinical judgement   ADL Overall ADL's : Needs assistance/impaired     Grooming: Oral care;Wash/dry face;Minimal assistance;Standing                               Functional mobility during ADLs: Minimal assistance;Rolling walker General ADL Comments: Pt completing room level functional mobility and standing grooming ADLs; additional focus on LUE AROM and fine motor HEP with handouts provided; education provided/questions answered regarding CIR process as post-op therapy                        Cognition Arousal/Alertness: Awake/alert Behavior During Therapy: WFL for tasks assessed/performed Overall Cognitive Status: Within Functional Limits for tasks assessed                                          Exercises Other Exercises Other Exercises: issued HEP for increasing fine motor with Pt return demonstrating with min verbal cues, x3 reps each exercises Other Exercises: Pt completing LUE AROM shoulder forward flexion (0-90*) and elbow flexion/ext for 5-10 reps           General Comments Pt's spouse  and daughters present, very supportive     Pertinent Vitals/ Pain       Pain Assessment: No/denies pain                                                          Frequency  Min 3X/week        Progress Toward Goals  OT Goals(current goals can now be found in the care plan section)  Progress towards OT goals: Progressing toward goals  Acute Rehab OT Goals Patient Stated Goal: home, independence OT Goal Formulation: With patient Time For Goal Achievement: 11/06/17 Potential to Achieve Goals: Good  Plan  Discharge plan remains appropriate                     AM-PAC PT "6 Clicks" Daily Activity     Outcome Measure   Help from another person eating meals?: A Little Help from another person taking care of personal grooming?: A Little Help from another person toileting, which includes using toliet, bedpan, or urinal?: A Lot Help from another person bathing (including washing, rinsing, drying)?: A Lot Help from another person to put on and taking off regular upper body clothing?: A Little Help from another person to put on and taking off regular lower body clothing?: A Lot 6 Click Score: 15    End of Session Equipment Utilized During Treatment: Rolling walker;Gait belt  OT Visit Diagnosis: Unsteadiness on feet (R26.81);Hemiplegia and hemiparesis Hemiplegia - Right/Left: Left Hemiplegia - dominant/non-dominant: Non-Dominant   Activity Tolerance Patient tolerated treatment well   Patient Left in chair;with call bell/phone within reach;with family/visitor present   Nurse Communication Mobility status        Time: 0820-0850 OT Time Calculation (min): 30 min  Charges: OT General Charges $OT Visit: 1 Visit OT Treatments $Self Care/Home Management : 8-22 mins $Therapeutic Activity: 8-22 mins  Lou Cal, OT Pager [phone] 10/24/2017    James Pugh 10/24/2017, 9:04 AM

## 2017-10-25 ENCOUNTER — Inpatient Hospital Stay (HOSPITAL_COMMUNITY): Payer: Medicare Other | Admitting: Physical Therapy

## 2017-10-25 ENCOUNTER — Inpatient Hospital Stay (HOSPITAL_COMMUNITY): Payer: Medicare Other | Admitting: Occupational Therapy

## 2017-10-25 ENCOUNTER — Other Ambulatory Visit: Payer: Self-pay

## 2017-10-25 ENCOUNTER — Inpatient Hospital Stay (HOSPITAL_COMMUNITY): Payer: Medicare Other

## 2017-10-25 DIAGNOSIS — E8809 Other disorders of plasma-protein metabolism, not elsewhere classified: Secondary | ICD-10-CM

## 2017-10-25 DIAGNOSIS — I6389 Other cerebral infarction: Secondary | ICD-10-CM

## 2017-10-25 DIAGNOSIS — I10 Essential (primary) hypertension: Secondary | ICD-10-CM

## 2017-10-25 DIAGNOSIS — E119 Type 2 diabetes mellitus without complications: Secondary | ICD-10-CM

## 2017-10-25 DIAGNOSIS — I6350 Cerebral infarction due to unspecified occlusion or stenosis of unspecified cerebral artery: Secondary | ICD-10-CM

## 2017-10-25 DIAGNOSIS — D62 Acute posthemorrhagic anemia: Secondary | ICD-10-CM

## 2017-10-25 DIAGNOSIS — E46 Unspecified protein-calorie malnutrition: Secondary | ICD-10-CM

## 2017-10-25 LAB — COMPREHENSIVE METABOLIC PANEL
ALT: 20 U/L (ref 17–63)
AST: 20 U/L (ref 15–41)
Albumin: 3.1 g/dL — ABNORMAL LOW (ref 3.5–5.0)
Alkaline Phosphatase: 34 U/L — ABNORMAL LOW (ref 38–126)
Anion gap: 11 (ref 5–15)
BILIRUBIN TOTAL: 0.9 mg/dL (ref 0.3–1.2)
BUN: 11 mg/dL (ref 6–20)
CHLORIDE: 105 mmol/L (ref 101–111)
CO2: 23 mmol/L (ref 22–32)
CREATININE: 0.9 mg/dL (ref 0.61–1.24)
Calcium: 9.2 mg/dL (ref 8.9–10.3)
Glucose, Bld: 120 mg/dL — ABNORMAL HIGH (ref 65–99)
POTASSIUM: 3.7 mmol/L (ref 3.5–5.1)
Sodium: 139 mmol/L (ref 135–145)
Total Protein: 6 g/dL — ABNORMAL LOW (ref 6.5–8.1)

## 2017-10-25 LAB — GLUCOSE, CAPILLARY
GLUCOSE-CAPILLARY: 109 mg/dL — AB (ref 65–99)
Glucose-Capillary: 107 mg/dL — ABNORMAL HIGH (ref 65–99)
Glucose-Capillary: 90 mg/dL (ref 65–99)

## 2017-10-25 MED ORDER — PRO-STAT SUGAR FREE PO LIQD
30.0000 mL | Freq: Two times a day (BID) | ORAL | Status: DC
  Administered 2017-10-25 – 2017-10-30 (×12): 30 mL via ORAL
  Filled 2017-10-25 (×13): qty 30

## 2017-10-25 NOTE — Progress Notes (Signed)
Physical Therapy Session Note  Patient Details  Name: [name] MRN: [id number] Date of Birth: July 03, 1946  Today's Date: 10/25/2017 PT Individual Time: 1110-1153 PT Individual Time Calculation (min): 43 min   Short Term Goals: Week 1:  PT Short Term Goal 1 (Week 1): STG=LTG due to ELOS  Skilled Therapeutic Interventions/Progress Updates:   Pt sitting in recliner and agreeable to therapy, no c/o pain. Educated wife and daughter Arby Barrette) on ambulating to/from bathroom w/ pt at [place] assist for balance, especially w/ turning to sit. Both verbalized understanding and returned demonstration safely. Ambulated 100' and 50' to therapy gym w/ min-mod assist for balance and verbal cues for gait pattern. Practiced sit<>stand from recliner as this is what pt primarily sits in at home. Performed transfer w/ min guard and verbalized understanding of balance considerations during transfer 2/2 rock of recliner. Performed dynamic standing balance tasks utilizing both UEs w/ emphasis on equal weight shift in stance and maintaining balance w/ anterior weight shifting. Ambulated back to room, 150' w/o rest break, and ended session sitting EOB and in care of family. All needs met.   Therapy Documentation Precautions:  Precautions Precautions: Fall Restrictions Weight Bearing Restrictions: No  See Function Navigator for Current Functional Status.   Therapy/Group: Individual Therapy  Yisrael Obryan K Arnette 10/25/2017, 11:55 AM

## 2017-10-25 NOTE — Evaluation (Signed)
Physical Therapy Assessment and Plan  Patient Details  Name: James Pugh MRN: [id number] Date of Birth: 18-Aug-1946  PT Diagnosis: Difficulty walking, Hemiparesis non-dominant and Muscle weakness Rehab Potential: Good ELOS: 7-9 days   Today's Date: 10/25/2017 PT Individual Time: 0801-0928 PT Individual Time Calculation (min): 87 min    Problem List:  Patient Active Problem List   Diagnosis Date Noted  . Right pontine cerebrovascular accident ([place]) 10/24/2017  . Acute CVA (cerebrovascular accident) ([place])   . Dysarthria, post-stroke   . Hyperlipidemia   . Diabetes mellitus type 2 in nonobese ([facility])   . Benign essential HTN   . Status post cataract extraction   . OSA (obstructive sleep apnea)   . CVA (cerebral vascular accident) ([place]) 10/22/2017  . PREDIABETES 07/13/2010  . CERUMEN IMPACTION 08/22/2009  . Dyslipidemia 06/06/2009    Past Medical History:  Past Medical History:  Diagnosis Date  . CERUMEN IMPACTION 08/22/2009  . HYPERLIPIDEMIA 06/06/2009  . PREDIABETES 07/13/2010   Past Surgical History:  Past Surgical History:  Procedure Laterality Date  . TONSILLECTOMY      Assessment & Plan Clinical Impression: Patient is a 72 y.o. year old male with history of hyperlipidemia, prediabeticand recent cataract surgery.Per chart review, wife, and patient, patientlives with spouse. Independent and active prior to admission. One level home.Presented 10/22/2017 with left-sided weakness and mild dysarthria. There was reports the patient had struck his head 4 days prior on a beam in his crawl space. There was no loss of consciousness. Cranial CT reviewed, unremarkable for acute intracranial process.Patient did not receive TPA. MRI showed no acute or focal abnormality. MRA without significant proximal stenosis,aneurysm or branch vessel occlusion.Echocardiogram with ejection fraction of 60% no wall motion abnormalities.Neurology consulted suspect acute perforator infarct in the  lower right pons. Currently maintained on aspirin and Plavix for CVA prophylaxis. Subcutaneous Lovenox for DVT prophylaxis. Tolerating a regular diet. Physical and occupational therapy evaluations completed with recommendations of physical medicine rehabilitation consult. Patient was admitted for a comprehensive rehabilitation program. Patient transferred to CIR on 10/24/2017 .   Patient currently requires min with mobility secondary to muscle weakness, unbalanced muscle activation and decreased coordination and decreased standing balance, decreased postural control and decreased balance strategies.  Prior to hospitalization, patient was independent  with mobility and lived with Spouse in a House home.  Home access is 2 steps through garage no rails, 5 steps at the front with railsStairs to enter.  Patient will benefit from skilled PT intervention to maximize safe functional mobility, minimize fall risk and decrease caregiver burden for planned discharge intermittent supervision.  Anticipate patient will benefit from follow up OP at discharge.  PT - End of Session Activity Tolerance: Tolerates 30+ min activity with multiple rests Endurance Deficit: Yes PT Assessment Rehab Potential (ACUTE/IP ONLY): Good PT Patient demonstrates impairments in the following area(s): Balance;Behavior;Endurance;Motor;Safety PT Transfers Functional Problem(s): Bed Mobility;Bed to Chair;Car;Furniture;Floor PT Locomotion Functional Problem(s): Ambulation;Wheelchair Mobility;Stairs PT Plan PT Intensity: Minimum of 1-2 x/day ,45 to 90 minutes PT Frequency: 5 out of 7 days PT Duration Estimated Length of Stay: 7-9 days PT Treatment/Interventions: Ambulation/gait training;Neuromuscular re-education;Stair training;UE/LE Strength taining/ROM;Wheelchair propulsion/positioning;Balance/vestibular training;Discharge planning;Therapeutic Activities;UE/LE Coordination activities;Cognitive remediation/compensation;Therapeutic  Exercise;Functional mobility training;Patient/family education;Community reintegration;DME/adaptive equipment instruction PT Transfers Anticipated Outcome(s): Mod I PT Locomotion Anticipated Outcome(s): supervision  PT Recommendation Follow Up Recommendations: Outpatient PT Patient destination: Home Equipment Recommended: To be determined  Skilled Therapeutic Intervention Evaluation completed (see details above and below) with education on PT POC and goals and individual  treatment initiated with focus on functional transfers, gait, safety and exercises to perform in the room. Pt supine in bed upon PT arrival, agreeable to therapy tx and denies pain. Pt transferred from sitting>supine with supervision and donned clothing with supervision in sitting, min assist for sit<>stand and standing balance to pull pants over hips. Pt donned shoes while seated EOB with supervision. Pt performed stand pivot transfer with min assist. Pt ambulated x 150 ft this session without AD, min assist with occasional mod assist needed to correct LOB from L toe drag, decreased dorsiflexion with verbal cues for heel strike during gait. Pt ascended/descended 4 steps without handrails, mod assist. Car transfer performed stand pivot with min assist. Pt performed berg balance test as detailed below, dicussed results with pt and family. Pt eager to perform LE strengthening exercises in the room, therapist provided exercise handout that pt could perform with ankle weight and theraband including: LAQ, hip flexion, hip abduction, dorsflexion and plantar flexion exercises. Worked on dynamic standing balance to perform sidestepping, backwards ambulation and forward ambulation, verbal cues for symmetric step length. Pt ambulated back to room and left seated in recliner with needs in reach.    PT Evaluation Precautions/Restrictions Precautions Precautions: Fall Restrictions Weight Bearing Restrictions: No General   Vital Signs Pain   denies pain Home Living/Prior Functioning Home Living Available Help at Discharge: Family;Available 24 hours/day Type of Home: House Home Access: Stairs to enter [employer] of Steps: 2 steps through garage no rails, 5 steps at the front with rails Entrance Stairs-Rails: Right;Left Home Layout: One level  Lives With: Spouse Prior Function Level of Independence: Independent with basic ADLs;Independent with gait  Able to Take Stairs?: Yes Driving: Yes Vocation: Retired Leisure: Hobbies-yes (Comment) Comments: very active, enjoys working in the yard and fixing things in the house Cognition Overall Cognitive Status: Within Functional Limits for tasks assessed Arousal/Alertness: Awake/alert Orientation Level: Oriented X4 Attention: Selective Selective Attention: Appears intact Memory: Appears intact Awareness: Appears intact Problem Solving: Appears intact Sensation Sensation Light Touch: Appears Intact(B LEs) Proprioception: Appears Intact Coordination Gross Motor Movements are Fluid and Coordinated: No Fine Motor Movements are Fluid and Coordinated: No Coordination and Movement Description: incoordination L LE Heel Shin Test: mildly impaired L LE Motor  Motor Motor - Skilled Clinical Observations: Mild L hemiparesis  Trunk/Postural Assessment  Cervical Assessment Cervical Assessment: Within Functional Limits Thoracic Assessment Thoracic Assessment: Within Functional Limits Lumbar Assessment Lumbar Assessment: Within Functional Limits Postural Control Postural Control: Within Functional Limits  Balance Balance Balance Assessed: Yes Standardized Balance Assessment Standardized Balance Assessment: Berg Balance Test Berg Balance Test Sit to Stand: Able to stand without using hands and stabilize independently Standing Unsupported: Able to stand 2 minutes with supervision Sitting with Back Unsupported but Feet Supported on Floor or Stool: Able to sit safely  and securely 2 minutes Stand to Sit: Sits safely with minimal use of hands Transfers: Able to transfer with verbal cueing and /or supervision Standing Unsupported with Eyes Closed: Able to stand 10 seconds with supervision Standing Ubsupported with Feet Together: Needs help to attain position and unable to hold for 15 seconds(Pt with LOB while standing with feet together) From Standing, Reach Forward with Outstretched Arm: Can reach confidently >25 cm (10") From Standing Position, Pick up Object from Floor: Able to pick up shoe, needs supervision From Standing Position, Turn to Look Behind Over each Shoulder: Looks behind one side only/other side shows less weight shift Turn 360 Degrees: Able to turn 360 degrees safely  but slowly Standing Unsupported, Alternately Place Feet on Step/Stool: Able to complete >2 steps/needs minimal assist Standing Unsupported, One Foot in Front: Able to take small step independently and hold 30 seconds Standing on One Leg: Able to lift leg independently and hold equal to or more than 3 seconds Total Score: 37 Static Sitting Balance Static Sitting - Level of Assistance: 5: Stand by assistance Dynamic Sitting Balance Dynamic Sitting - Level of Assistance: 5: Stand by assistance Static Standing Balance Static Standing - Level of Assistance: 4: Min assist Dynamic Standing Balance Dynamic Standing - Level of Assistance: 4: Min assist Extremity Assessment  RLE Assessment RLE Assessment: Within Functional Limits LLE Assessment LLE Assessment: Exceptions to [facility] LLE Strength Left Hip Flexion: 4/5 Left Hip Extension: 4/5 Left Knee Flexion: 4/5 Left Knee Extension: 4/5 Left Ankle Dorsiflexion: 2+/5 Left Ankle Plantar Flexion: 2+/5   See Function Navigator for Current Functional Status.   Refer to Care Plan for Long Term Goals  Recommendations for other services: None   Discharge Criteria: Patient will be discharged from PT if patient refuses treatment 3  consecutive times without medical reason, if treatment goals not met, if there is a change in medical status, if patient makes no progress towards goals or if patient is discharged from hospital.  The above assessment, treatment plan, treatment alternatives and goals were discussed and mutually agreed upon: by patient and by family  Netta Corrigan, PT, DPT 10/25/2017, 9:33 AM

## 2017-10-25 NOTE — Progress Notes (Signed)
Patient information reviewed and entered into eRehab system by Akiba Melfi, RN, CRRN, PPS Coordinator.  Information including medical coding and functional independence measure will be reviewed and updated through discharge.     Per nursing patient was given "Data Collection Information Summary for Patients in Inpatient Rehabilitation Facilities with attached "Privacy Act Statement-Health Care Records" upon admission.  

## 2017-10-25 NOTE — Progress Notes (Signed)
[facility] PHYSICAL MEDICINE & REHABILITATION     PROGRESS NOTE  Subjective/Complaints:  Pt seen sitting up in his chair this AM.  He states he slept well overnight.  He is ready to begin therapies.   ROS: Denies CP, SOB, N/V/D.  Objective: Vital Signs: Blood pressure (!) 149/71, pulse (!) 56, temperature 97.7 F (36.5 C), temperature source Oral, resp. rate 17, height 5\' 6"  (1.676 m), weight 73.4 kg (161 lb 14.4 oz), SpO2 97 %. No results found. Recent Labs    10/22/17 0943 10/24/17 2001  WBC 6.8 7.7  HGB 14.2 12.6*  HCT 42.5 37.5*  PLT 337 296   Recent Labs    10/22/17 0943 10/24/17 1951 10/25/17 0528  NA 137  --  139  K 4.2  --  3.7  CL 102  --  105  GLUCOSE 138*  --  120*  BUN 11  --  11  CREATININE 0.99 1.03 0.90  CALCIUM 9.4  --  9.2   CBG (last 3)  Recent Labs    10/22/17 1249  GLUCAP 108*    Wt Readings from Last 3 Encounters:  10/24/17 73.4 kg (161 lb 14.4 oz)  10/22/17 76.2 kg (168 lb)  10/26/16 76.7 kg (169 lb 1.6 oz)    Physical Exam:  BP (!) 149/71 (BP Location: Left Arm)   Pulse (!) 56   Temp 97.7 F (36.5 C) (Oral)   Resp 17   Ht 5\' 6"  (1.676 m)   Wt 73.4 kg (161 lb 14.4 oz)   SpO2 97%   BMI 26.13 kg/m  Constitutional: He appears well-developed and well-nourished.  HENT: Normocephalic and atraumatic.  Eyes: EOM are normal. No discharge.  Cardiovascular: Normal rate and regular rhythm. No JVD. Respiratory: Effort normal and breath sounds normal.  GI: Bowel sounds are normal. Nondistended Musculoskeletal:  No edema or tenderness in extremities  Neurological: He is alert and oriented.  Motor: RUE/RLE: 5/5 proximal to distal LUE: 4/5 proximal to distal LLE: 4-/5 HF, KE, 2+/5 ADF/PF Skin: Skin is warm and dry.  Psychiatric: He has a normal mood and affect. His behavior is normal. Thought content normal.   Assessment/Plan: 1. Functional deficits secondary to acute perforator infarct in the lower right pons which require 3+ hours per  day of interdisciplinary therapy in a comprehensive inpatient rehab setting. Physiatrist is providing close team supervision and 24 hour management of active medical problems listed below. Physiatrist and rehab team continue to assess barriers to discharge/monitor patient progress toward functional and medical goals.  Function:  Bathing Bathing position      Bathing parts      Bathing assist        Upper Body Dressing/Undressing Upper body dressing                    Upper body assist        Lower Body Dressing/Undressing Lower body dressing                                  Lower body assist        Toileting Toileting          Toileting assist     Transfers Chair/bed transfer   Chair/bed transfer method: Stand pivot Chair/bed transfer assist level: Touching or steadying assistance (Pt > 75%) Chair/bed transfer assistive device: Armrests     Locomotion Ambulation     Max distance:  150 ft Assist level: Moderate assist (Pt 50 - 74%)   Wheelchair          Cognition Comprehension Comprehension assist level: Follows complex conversation/direction with extra time/assistive device  Expression Expression assist level: Expresses complex ideas: With extra time/assistive device  Social Interaction Social Interaction assist level: Interacts appropriately with others with medication or extra time (anti-anxiety, antidepressant).  Problem Solving Problem solving assist level: Solves complex problems: With extra time  Memory Memory assist level: More than reasonable amount of time    Medical Problem List and Plan: 1.  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  DVT Prophylaxis/Anticoagulation: Subcutaneous Lovenox. Monitor for any bleeding episodes 3. Pain Management: Tylenol as needed 4. Mood: Provide emotional support 5. Neuropsych: This patient is capable of making decisions on his own  behalf. 6. Skin/Wound Care: Routine skin checks 7. Fluids/Electrolytes/Nutrition: Routine I&O's    BMP within acceptable range on 2/12 8. Hyperlipidemia. Lipitor 9. Diabetes Mellitus type 2. Hemoglobin A1c 6.9.    Monitor with increased mobility 10. Recent cataract surgery. Follow-up outpatient 11. Constipation. Laxative assistance 12. HTN   Monitor with increased mobility 13. Hypoalbuminemia   Supplement initated on 2/12 14. ABLA   Hb 12.6 on 2/11    Cont to monitor  LOS (Days) 1 A FACE TO FACE EVALUATION WAS PERFORMED  Ankit Lorie Phenix 10/25/2017 9:30 AM

## 2017-10-25 NOTE — Progress Notes (Signed)
Physical Medicine and Rehabilitation Consult Reason for Consult: Left side weakness Referring Physician: [facility]   HPI: James Pugh is a 72 y.o. right handed male with history of hyperlipidemia, prediabetic and recent cataract surgery. Per chart review, wife, and patient, patient lives with spouse. Independent and active prior to admission. One level home. Presented 10/22/2017 with left-sided weakness and mild dysarthria. There was reports the patient had struck his head 4 days prior on a beam in his crawl space. There was no loss of consciousness. Cranial CT reviewed, unremarkable for acute intracranial process. Patient did not receive TPA. MRI showed no acute or focal abnormality. MRA without significant proximal stenosis, aneurysm or branch vessel occlusion. Echocardiogram with ejection fraction of 60% no wall motion abnormalities. 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 Currently maintained on aspirin and Plavix for CVA prophylaxis. Subcutaneous Lovenox for DVT prophylaxis. Tolerating a regular diet. Physical and occupational therapy evaluations completed with recommendations of physical medicine rehabilitation consult.   Review of Systems  Constitutional: Negative for chills and fever.  HENT: Negative for hearing loss.   Eyes: Negative for blurred vision and double vision.  Respiratory: Negative for shortness of breath.   Cardiovascular: Negative for chest pain, palpitations and leg swelling.  Gastrointestinal: Positive for constipation. Negative for nausea and vomiting.  Genitourinary: Negative for dysuria, flank pain and hematuria.  Musculoskeletal: Positive for myalgias.  Skin: Negative for rash.  Neurological: Positive for sensory change, speech change and focal weakness. Negative for seizures.  All other systems reviewed and are negative.      Past Medical History:  Diagnosis Date  . CERUMEN IMPACTION 08/22/2009  . HYPERLIPIDEMIA  06/06/2009  . PREDIABETES 07/13/2010        Past Surgical History:  Procedure Laterality Date  . TONSILLECTOMY          Family History  Problem Relation Age of Onset  . Stroke Father   . Heart disease Father   . Diabetes Father        type ll  . Alcohol abuse Other    Social History:  reports that he quit smoking about 34 years ago. His smoking use included cigarettes. He has a 20.00 pack-year smoking history. he has never used smokeless tobacco. He reports that he does not drink alcohol or use drugs. Allergies: No Known Allergies       Medications Prior to Admission  Medication Sig Dispense Refill  . aspirin 81 MG tablet Take 81 mg by mouth daily.       Home: Home Living Family/patient expects to be discharged to:: Private residence Living Arrangements: Spouse/significant other Available Help at Discharge: Family, Available 24 hours/day Type of Home: House Home Access: Level entry [place]: One level Bathroom Shower/Tub: Multimedia programmer: [place]: Careers adviser History: Prior Function Level of Independence: Independent Comments: active  Functional Status:  Mobility: Bed Mobility Overal bed mobility: Needs Assistance Bed Mobility: Supine to Sit Supine to sit: Mod assist, HOB elevated General bed mobility comments: assist to elevate trunk Transfers Overall transfer level: Needs assistance Equipment used: Rolling walker (2 wheeled) Transfers: Sit to/from Stand Sit to Stand: Min assist General transfer comment: verbal cues for hand placement, assist to power up Ambulation/Gait Ambulation/Gait assistance: Min assist Ambulation Distance (Feet): 25 Feet Assistive device: Rolling walker (2 wheeled) Gait Pattern/deviations: Step-through pattern, Decreased stride length General Gait Details: mild steppage gait LLE, assist to maintain grip on RW with L hand Gait velocity: decreased  Gait velocity interpretation:  Below normal speed for age/gender  ADL: ADL Overall ADL's : Needs assistance/impaired Eating/Feeding: Set up, Sitting Grooming: Minimal assistance, Sitting Upper Body Bathing: Moderate assistance, Sitting Lower Body Bathing: Moderate assistance, Sit to/from stand Upper Body Dressing : Moderate assistance, Sitting Lower Body Dressing: Moderate assistance, Sit to/from stand Toilet Transfer: Minimal assistance, Ambulation, RW Toileting- Clothing Manipulation and Hygiene: Minimal assistance, Sit to/from stand Tub/ Shower Transfer: Walk-in shower, Minimal assistance, Ambulation, 3 in 1, Rolling walker Functional mobility during ADLs: Minimal assistance, Rolling walker General ADL Comments: Pt completed bed mobility, ambulated in the room from bed to chair.   Cognition: Cognition Overall Cognitive Status: Within Functional Limits for tasks assessed Orientation Level: Oriented X4 Cognition Arousal/Alertness: Awake/alert Behavior During Therapy: WFL for tasks assessed/performed Overall Cognitive Status: Within Functional Limits for tasks assessed  Blood pressure (!) 160/72, pulse (!) 57, temperature 97.8 F (36.6 C), temperature source Oral, resp. rate 18, height 5\' 6"  (1.676 m), weight 76.2 kg (168 lb), SpO2 100 %. Physical Exam  Vitals reviewed. Constitutional: He is oriented to person, place, and time. He appears well-developed and well-nourished.  HENT:  Head: Normocephalic and atraumatic.  Eyes: EOM are normal. Right eye exhibits no discharge. Left eye exhibits no discharge.  Neck: Normal range of motion. Neck supple. No thyromegaly present.  Cardiovascular: Normal rate, regular rhythm and normal heart sounds.  Respiratory: Effort normal and breath sounds normal. No respiratory distress.  GI: Soft. Bowel sounds are normal. He exhibits no distension.  Musculoskeletal:  No edema or tenderness in extremities  Neurological: He is alert and oriented to person, place, and time.    Follows full commands.  Fair awareness of deficits. Motor: RUE/RLE: 5/5 proximal to distal LUE: 4/5 proximal to distal LLE: 4-/5 HF, KE, 2+/5 ADF/PF Sensation intact to light touch  Skin: Skin is warm and dry.  Psychiatric: He has a normal mood and affect. His behavior is normal.   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  Records reviewed and summated above. Stroke: Continue secondary stroke prophylaxis and Risk Factor Modification listed below:   Antiplatelet therapy:   Blood Pressure Management:  Continue current medication with prn's with permisive HTN per primary team Statin Agent:   Diabetes management:   Left sided hemiparesis: fit for orthosis to prevent contractures (PRAFO) Motor recovery: Fluoxetine  1. Does the need for close, 24 hr/day medical supervision in concert with the patient's rehab needs make it unreasonable for this patient to be served in a less intensive setting? Yes  2. Co-Morbidities requiring supervision/potential complications: dysarthria (therapies), hyperlipidemia (cont meds), DM (Monitor in accordance with exercise and adjust meds as necessary), recent cataract surgery, HTN (monitor and provide prns in accordance with increased physical exertion and pain) 3. Due to bladder management, safety, disease management and patient education, does the patient require 24 hr/day rehab nursing? Yes 4. Does the patient require coordinated care of a physician, rehab nurse, PT (1-2 hrs/day, 5 days/week), OT (1-2 hrs/day, 5 days/week) and SLP (1-2 hrs/day, 5 days/week) to address physical and functional deficits in the context of the above medical diagnosis(es)? Yes Addressing deficits in the following areas: balance, endurance, locomotion, strength, transferring, bathing, dressing, toileting, speech and psychosocial support 5. Can the patient actively participate in an intensive therapy program of  at least 3 hrs of therapy per day at least 5 days per week? Yes 6. The potential for patient to make measurable gains while on inpatient  rehab is excellent 7. Anticipated functional outcomes upon discharge from inpatient rehab are modified independent and supervision  with PT, modified independent and supervision with OT, n/a with SLP. 8. Estimated rehab length of stay to reach the above functional goals is: 9-14 days. 9. Anticipated D/C setting: Home 10. Anticipated post D/C treatments: HH therapy and Home excercise program 11. Overall Rehab/Functional Prognosis: good  RECOMMENDATIONS: This patient's condition is appropriate for continued rehabilitative care in the following setting: CIR Patient has agreed to participate in recommended program. Yes Note that insurance prior authorization may be required for reimbursement for recommended care.  Comment: Rehab Admissions Coordinator to follow up.  Delice Lesch, MD, ABPMR Lavon Paganini Angiulli, PA-C 10/24/2017          Revision History                        Routing History

## 2017-10-25 NOTE — Progress Notes (Signed)
Social Work  Social Work Assessment and Plan  Patient Details  Name: James Pugh MRN: [id number] Date of Birth: 02/03/1946  Today's Date: 10/25/2017  Problem List:  Patient Active Problem List   Diagnosis Date Noted  . Acute blood loss anemia   . Hypoalbuminemia due to protein-calorie malnutrition ([place])   . Reactive hypertension   . Right pontine cerebrovascular accident ([place]) 10/24/2017  . Acute CVA (cerebrovascular accident) ([place])   . Dysarthria, post-stroke   . Hyperlipidemia   . Diabetes mellitus type 2 in nonobese ([facility])   . Benign essential HTN   . Status post cataract extraction   . OSA (obstructive sleep apnea)   . CVA (cerebral vascular accident) ([place]) 10/22/2017  . PREDIABETES 07/13/2010  . CERUMEN IMPACTION 08/22/2009  . Dyslipidemia 06/06/2009   Past Medical History:  Past Medical History:  Diagnosis Date  . CERUMEN IMPACTION 08/22/2009  . HYPERLIPIDEMIA 06/06/2009  . PREDIABETES 07/13/2010   Past Surgical History:  Past Surgical History:  Procedure Laterality Date  . TONSILLECTOMY     Social History:  reports that he quit smoking about 34 years ago. His smoking use included cigarettes. He has a 20.00 pack-year smoking history. he has never used smokeless tobacco. He reports that he does not drink alcohol or use drugs.  Family / Support Systems Marital Status: Married Patient Roles: Spouse, Parent, Other (Comment)(retiree) Spouse/Significant Other: Jeani Hawking [phone] Children: Two grown daughter's one is a Therapist, sports at [employer] and one is local Other Supports: Friends and church members Anticipated Caregiver: Retail buyer Ability/Limitations of Caregiver: None in good health Caregiver Availability: 24/7 Family Dynamics: Close knit family who pull together when there is a crisis. Pt didn't realize he was having a stroke. He has good supports via family and friends. He is grateful to be doing as well as he is doing and hopeful his will continue  Social  History Preferred language: English Religion: Non-Denominational Cultural Background: No issues Education: High School Read: Yes Write: Yes Employment Status: Retired Freight forwarder Issues: No issues Guardian/Conservator: None-according to MD pt is capable of making his own decisions while here. Wife plans to be here daily and involved in his care here   Abuse/Neglect Abuse/Neglect Assessment Can Be Completed: Yes Physical Abuse: Denies Verbal Abuse: Denies Sexual Abuse: Denies Exploitation of patient/patient's resources: Denies Self-Neglect: Denies  Emotional Status Pt's affect, behavior adn adjustment status: Pt is very independent and always has been. He has remained active in his retirement and feels this is what you are suppose to do and feels good about it. He does plan to change his eating habits to prevent another one.  Daughter attests to him being very independent. Recent Psychosocial Issues: other health issues thought were managed by PCP Pyschiatric History: No history deferred depression screening due to seems to be coping appropriately and abe to communicate his feelings and concerns. He does admit it is hard to ask for help form others. Will monitor and see if team feels would benefit from seeing neuro-psych while here. Substance Abuse History: No issues  Patient / Family Perceptions, Expectations & Goals Pt/Family understanding of illness & functional limitations: Pt and daughter are able to explain his stroke and deficits. He is getting more movement back then when he first came into the hospital and this is encouraging to him. He feels his questions and concerns are being addressed and aware of his treatment plan. Premorbid pt/family roles/activities: father, husband, retiree, [place], Heritage manager, church member, etc Anticipated changes in roles/activities/participation: resume  once recovered Pt/family expectations/goals: Pt states: " I want to be able to take  care of myself before I leave here, I don't like asking for help."  Daughter states: " I hope he does well he is not one to ask for help and will just try to do it on his own."  [employer]: None Premorbid Home Care/DME Agencies: None Transportation available at discharge: Wife and daughter's Resource referrals recommended: Support group (specify)  Discharge Planning Living Arrangements: Spouse/significant other Support Systems: Spouse/significant other, Children, Water engineer, Social worker community Type of Residence: Private residence Insurance underwriter Resources: [employer], Multimedia programmer (specify)(BCBS) Financial Resources: Radio broadcast assistant Screen Referred: No Living Expenses: Own Money Management: Spouse, Patient Does the patient have any problems obtaining your medications?: No Home Management: He and wife Patient/Family Preliminary Plans: Return home with wife who is in good health and can assist if needed. She plans to be here daily and be involved in his care here. Will await therapy team evaluations and work on discharge plan. Social Work Anticipated Follow Up Needs: HH/OP, Support Group  Clinical Impression Pleasant gentleman who was very active prior to admission and wants to remain this way. He has been very healthy and admits this is hard to ask for assistance. His family is very supportive and involved and will assist if needed. Will monitor and ask team if would benefit from seeing neuro-psych while here. Await therapy team's evaluations.  Elease Hashimoto 10/25/2017, 10:30 AM

## 2017-10-25 NOTE — Evaluation (Signed)
Occupational Therapy Assessment and Plan  Patient Details  Name: James Pugh MRN: [id number] Date of Birth: 1945-10-30  OT Diagnosis: hemiplegia affecting non-dominant side Rehab Potential: Rehab Potential (ACUTE ONLY): Good ELOS: ~10-12 days   Today's Date: 10/25/2017 OT Individual Time: 1300-1400 OT Individual Time Calculation (min): 60 min     Problem List:  Patient Active Problem List   Diagnosis Date Noted  . Acute blood loss anemia   . Hypoalbuminemia due to protein-calorie malnutrition ([place])   . Reactive hypertension   . Right pontine cerebrovascular accident ([place]) 10/24/2017  . Acute CVA (cerebrovascular accident) ([place])   . Dysarthria, post-stroke   . Hyperlipidemia   . Diabetes mellitus type 2 in nonobese ([facility])   . Benign essential HTN   . Status post cataract extraction   . OSA (obstructive sleep apnea)   . CVA (cerebral vascular accident) ([place]) 10/22/2017  . PREDIABETES 07/13/2010  . CERUMEN IMPACTION 08/22/2009  . Dyslipidemia 06/06/2009    Past Medical History:  Past Medical History:  Diagnosis Date  . CERUMEN IMPACTION 08/22/2009  . HYPERLIPIDEMIA 06/06/2009  . PREDIABETES 07/13/2010   Past Surgical History:  Past Surgical History:  Procedure Laterality Date  . TONSILLECTOMY      Assessment & Plan Clinical Impression: Patient is a 72 y.o. right handed malewith history of hyperlipidemia, prediabeticand recent cataract surgery.Per chart review, wife, and patient, patientlives with spouse. Independent and active prior to admission. One level home.Presented 10/22/2017 with left-sided weakness and mild dysarthria. There was reports the patient had struck his head 4 days prior on a beam in his crawl space. There was no loss of consciousness. Cranial CT reviewed, unremarkable for acute intracranial process.Patient did not receive TPA. MRI showed no acute or focal abnormality. MRA without significant proximal stenosis,aneurysm or branch vessel  occlusion.Echocardiogram with ejection fraction of 60% no wall motion abnormalities.Neurology consulted suspect acute perforator infarct in the lower right pons. Currently maintained on aspirin and Plavix for CVA prophylaxis. Subcutaneous Lovenox for DVT prophylaxis. Tolerating a regular diet.    Patient transferred to CIR on 10/24/2017 .    Patient currently requires min to mod A  with basic self-care skills and functional mobility  secondary to muscle weakness and slightly impulsive, decreased cardiorespiratoy endurance, impaired timing and sequencing, unbalanced muscle activation, decreased coordination and decreased motor planning and decreased sitting balance, decreased standing balance, hemiplegia, decreased balance strategies and difficulty maintaining precautions.  Prior to hospitalization, patient could complete ADL with independent .  Patient will benefit from skilled intervention to decrease level of assist with basic self-care skills and increase independence with basic self-care skills prior to discharge home with care partner.  Anticipate patient will require intermittent supervision and follow up outpatient.  OT - End of Session Activity Tolerance: Tolerates 30+ min activity with multiple rests Endurance Deficit: Yes OT Assessment Rehab Potential (ACUTE ONLY): Good OT Patient demonstrates impairments in the following area(s): Balance;Safety;Sensory;Edema;Endurance;Motor OT Basic ADL's Functional Problem(s): Grooming;Bathing;Dressing;Toileting OT Transfers Functional Problem(s): Toilet;Tub/Shower OT Additional Impairment(s): Fuctional Use of Upper Extremity OT Plan OT Intensity: Minimum of 1-2 x/day, 45 to 90 minutes OT Frequency: 5 out of 7 days OT Duration/Estimated Length of Stay: ~10-12 days OT Treatment/Interventions: Balance/vestibular training;Discharge planning;Functional electrical stimulation;Pain management;Self Care/advanced ADL retraining;Therapeutic Activities;UE/LE  Coordination activities;Visual/perceptual remediation/compensation;Therapeutic Exercise;Skin care/wound managment;Patient/family education;Functional mobility training;Disease mangement/prevention;Community reintegration;DME/adaptive equipment instruction;Neuromuscular re-education;Psychosocial support;Splinting/orthotics;UE/LE Strength taining/ROM;Wheelchair propulsion/positioning OT Self Feeding Anticipated Outcome(s): n/a OT Basic Self-Care Anticipated Outcome(s): mod I  OT Toileting Anticipated Outcome(s): mod I  OT Bathroom Transfers  Anticipated Outcome(s): mod I  OT Recommendation Recommendations for Other Services: Neuropsych consult Patient destination: Home Follow Up Recommendations: Outpatient OT   Skilled Therapeutic Intervention 1:1 OT eval initiated with OT purpose, role and goals discussed with pt and pt's wife. Self care retraining at shower level with focus on functional ambulation with min to mod A without AD. Dynamic Standing balance with and without UE support. Pt attempts to wash feet and thread LB clothing in standing but pt with increased fall risk and LOB with single limb stance on left LE. Pt requires extra time to complete tasks and pt does appear to present impulsive at times. Pt with decr grasp strength and [place].  Issued home exercises with blue firm theraputty. Pt ambulated to and from the gym with min to mod A with AD with tactile and verbal cues for attention to left LE. Pt left with wife in bed resting.    OT Evaluation Precautions/Restrictions  Precautions Precautions: Fall Restrictions Weight Bearing Restrictions: No General Chart Reviewed: Yes Family/Caregiver Present: Yes(wife) Vital Signs Therapy Vitals Temp: 97.8 F (36.6 C) Temp Source: Oral Pulse Rate: 62 Resp: 18 BP: (!) 161/67 Patient Position (if appropriate): Lying Oxygen Therapy SpO2: 97 % O2 Device: Not Delivered Pain Pain Assessment Pain Assessment: No/denies pain Home Living/Prior  Functioning Home Living Family/patient expects to be discharged to:: Private residence Living Arrangements: Spouse/significant other Available Help at Discharge: Family, Available 24 hours/day Type of Home: House Home Access: Stairs to enter [employer] of Steps: 2 steps through garage no rails, 5 steps at the front with rails Entrance Stairs-Rails: Right, Left Home Layout: One level Bathroom Shower/Tub: Walk-in shower  Lives With: Spouse Prior Function Level of Independence: Independent with basic ADLs, Independent with gait  Able to Take Stairs?: Yes Driving: Yes Vocation: Retired Leisure: Hobbies-yes (Comment) Comments: very active, enjoys working in the yard and fixing things in the house ADL ADL ADL Comments: see functional navigator Vision Patient Visual Report: No change from baseline Perception   WFL Praxis Praxis: Impaired Praxis Impairment Details: Motor planning Cognition Overall Cognitive Status: Within Functional Limits for tasks assessed Arousal/Alertness: Awake/alert Orientation Level: Person Year: 2019 Month: February Day of Week: Correct Memory: Appears intact Immediate Memory Recall: Sock;Blue;Bed Memory Recall: Sock;Blue;Bed Memory Recall Sock: Without Cue Memory Recall Blue: Without Cue Memory Recall Bed: Without Cue Attention: Selective Selective Attention: Appears intact Awareness: Appears intact Problem Solving: Appears intact Safety/Judgment: Impaired Sensation Sensation Light Touch: Appears Intact Hot/Cold: Appears Intact Proprioception: Appears Intact Coordination Gross Motor Movements are Fluid and Coordinated: No Fine Motor Movements are Fluid and Coordinated: No Coordination and Movement Description: incoordination L LE and UE Motor  Motor Motor: Hemiplegia Motor - Skilled Clinical Observations: Mild L hemiparesis Mobility  Transfers Transfers: Sit to Stand;Stand to Sit Sit to Stand: 4: Min assist Stand to Sit: 4:  Min assist  Trunk/Postural Assessment  Cervical Assessment Cervical Assessment: Within Functional Limits Thoracic Assessment Thoracic Assessment: Within Functional Limits Lumbar Assessment Lumbar Assessment: Within Functional Limits Postural Control Postural Control: Within Functional Limits  Balance Static Standing Balance Static Standing - Level of Assistance: 4: Min assist Dynamic Standing Balance Dynamic Standing - Level of Assistance: 4: Min assist Extremity/Trunk Assessment RUE Assessment RUE Assessment: Within Functional Limits LUE Assessment LUE Assessment: Exceptions to WFL LUE AROM (degrees) LUE Overall AROM Comments: WFL LUE Strength LUE Overall Strength Comments: Brunstrom VI   See Function Navigator for Current Functional Status.   Refer to Care Plan for Long Term Goals  Recommendations  for other services: Neuropsych   Discharge Criteria: Patient will be discharged from OT if patient refuses treatment 3 consecutive times without medical reason, if treatment goals not met, if there is a change in medical status, if patient makes no progress towards goals or if patient is discharged from hospital.  The above assessment, treatment plan, treatment alternatives and goals were discussed and mutually agreed upon: by patient  Nicoletta Ba 10/25/2017, 2:32 PM

## 2017-10-25 NOTE — Progress Notes (Signed)
PMR Admission Coordinator Pre-Admission Assessment  Patient: James Pugh is an 72 y.o., male MRN: [id number] DOB: [birth year] Height: 5\' 6"  (167.6 cm) Weight: 76.2 kg (168 lb)                                                                                                                                                  Insurance Information HMO:     PPO:      PCP:      IPA:      80/20:      OTHER:  PRIMARY: Medicare A & B      Policy#: [id number]      Subscriber: Self CM Name:       Phone#:      Fax#:  Pre-Cert#: Eligible       Employer: Retired  Benefits:  Phone #: Verified online     Name: [employer]  Eff. Date: 05/14/09     Deduct: $1364      Out of Pocket Max: N/A      Life Max: N/A CIR: 100%      SNF: 100% days 1-20; 80% days 21-100 Outpatient: 80%     Co-Pay: 20% Home Health: 100%      Co-Pay: $0 DME: 80%     Co-Pay: 20% Providers: Patient's choice   SECONDARY: [place]       Policy#: [id number]      Subscriber: Self CM Name:       Phone#:      Fax#:  Pre-Cert#:       Employer: Retired  Benefits:  Phone #: [phone]     Name:  Eff. 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 Independent and active prior to admission. One level home.Presented 10/22/2017 with left-sided weakness and mild dysarthria.  There was reports the patient had struck his head 4 days prior on a beam in his crawl space. There was no loss of consciousness.  Cranial CT reviewed, unremarkable for acute intracranial process.Patient did not receive TPA. MRI showed no acute or focal abnormality. MRA without significant proximal stenosis,aneurysm or branch vessel occlusion.Echocardiogram with ejection fraction of 60% no wall motion abnormalities.Neurology consulted suspect acute perforator infarct in the lower right pons. Currently maintained on aspirin and Plavix for CVA prophylaxis. Subcutaneous Lovenox for DVT prophylaxis. Tolerating a regular diet. Physical and occupational therapy evaluations completed with recommendations of physical medicine rehabilitation consult.Patient was admitted for a comprehensive rehabilitation program 10/24/17. (See consult note from today)  NIH Total: 6  Past Medical History      Past Medical History:  Diagnosis Date  . CERUMEN IMPACTION 08/22/2009  . HYPERLIPIDEMIA 06/06/2009  . PREDIABETES 07/13/2010    Family History  family history includes Alcohol abuse in his other; Diabetes in his father; Heart disease in his father; Stroke in his father.  Prior Rehab/Hospitalizations:  Has the patient had major surgery during 100 days prior to admission? No  Current Medications   Current Facility-Administered Medications:  .  0.9 %  sodium chloride infusion, , Intravenous, Continuous, Amie Portland, MD, Last Rate: 100 mL/hr at 10/24/17 0342 .  acetaminophen (TYLENOL) tablet 650 mg, 650 mg, Oral, Q4H PRN **OR** acetaminophen (TYLENOL) solution 650 mg, 650 mg, Per Tube, Q4H PRN **OR** acetaminophen (TYLENOL) suppository 650 mg, 650 mg, Rectal, Q4H PRN, Purohit, Shrey C, MD .  aspirin EC tablet 81 mg, 81 mg, Oral, Daily, Rinehuls, David L, PA-C, 81 mg at 10/24/17 1027 .  atorvastatin (LIPITOR) tablet 80 mg, 80 mg, Oral, q1800, Amankwah, Pearl, NP, 80 mg at 10/23/17 1820 .  clopidogrel  (PLAVIX) tablet 75 mg, 75 mg, Oral, Daily, Greta Doom, MD, 75 mg at 10/24/17 1027 .  docusate sodium (COLACE) capsule 100 mg, 100 mg, Oral, BID, Mikhail, [place], DO, 100 mg at 10/24/17 1027 .  enoxaparin (LOVENOX) injection 40 mg, 40 mg, Subcutaneous, Q24H, Purohit, Shrey C, MD, 40 mg at 10/23/17 2132 .  senna-docusate (Senokot-S) tablet 1 tablet, 1 tablet, Oral, QHS PRN, Purohit, Konrad Dolores, MD  Patients Current Diet: Fall precautions Diet 2 gram sodium Room service appropriate? Yes; Fluid consistency: Thin Fall precautions  Precautions / Restrictions Precautions Precautions: Fall Restrictions Weight Bearing Restrictions: No   Has the patient had 2 or more falls or a fall with injury in the past year?No  Prior Activity Level Community (5-7x/wk): Prior to admission patient was fully independent and active.  He enjoyed boating, fishing, yard and house work.    Home Assistive Devices / Equipment Home Assistive Devices/Equipment: None Home Equipment: Shower seat  Prior Device Use: Indicate devices/aids used by the patient prior to current illness, exacerbation or injury? None of the above  Prior Functional Level Prior Function Level of Independence: Independent Comments: active   Self Care: Did the patient need help bathing, dressing, using the toilet or eating? Independent  Indoor Mobility: Did the patient need assistance with walking from room to room (with or without device)? Independent  Stairs: Did the patient need assistance with internal or external stairs (with or without device)? Independent  Functional Cognition: Did the patient need help planning regular tasks such as shopping or remembering to take medications? Independent  Current Functional Level Cognition  Arousal/Alertness: Awake/alert Overall Cognitive Status: Within Functional Limits for tasks assessed Orientation Level: Oriented X4 Attention: Selective Selective Attention: Appears  intact Memory: Appears intact Awareness: Appears intact Problem Solving: Appears intact    Extremity Assessment (includes Sensation/Coordination)  Upper Extremity Assessment: Defer to OT  evaluation LUE Deficits / Details: grossly 2/5 stronger proximal than distal. Sensation intact with exception of numb/tingling in digits. Thumb opposition impaired.  LUE Coordination: decreased fine motor, decreased gross motor  Lower Extremity Assessment: LLE deficits/detail LLE Deficits / Details: c/o tingling L foot; 4-/5 flexors grossly graded LLE Sensation: decreased proprioception    ADLs  Overall ADL's : Needs assistance/impaired Eating/Feeding: Set up, Sitting Grooming: Oral care, Wash/dry face, Minimal assistance, Standing Upper Body Bathing: Moderate assistance, Sitting Lower Body Bathing: Moderate assistance, Sit to/from stand Upper Body Dressing : Moderate assistance, Sitting Lower Body Dressing: Moderate assistance, Sit to/from stand Toilet Transfer: Minimal assistance, Ambulation, RW Toileting- Clothing Manipulation and Hygiene: Minimal assistance, Sit to/from stand Tub/ Shower Transfer: Walk-in shower, Minimal assistance, Ambulation, 3 in 1, Rolling walker Functional mobility during ADLs: Minimal assistance, Rolling walker General ADL Comments: Pt completing room level functional mobility and standing grooming ADLs; additional focus on LUE AROM and fine motor HEP with handouts provided; education provided/questions answered regarding CIR process as post-op therapy     Mobility  Overal bed mobility: Needs Assistance Bed Mobility: Supine to Sit Supine to sit: Supervision, HOB elevated General bed mobility comments: No assist needed, use of rail for support.     Transfers  Overall transfer level: Needs assistance Equipment used: None Transfers: Sit to/from Stand Sit to Stand: Min guard General transfer comment: Min guard for safety. Cues to place equal WB through BLEs to  stand and slow descent. Stood from Adult nurse.    Ambulation / Gait / Stairs / Wheelchair Mobility  Ambulation/Gait Ambulation/Gait assistance: Museum/gallery curator (Feet): 100 Feet(+ 50') Assistive device: None Gait Pattern/deviations: Step-through pattern, Decreased stride length, Decreased dorsiflexion - left, Steppage General Gait Details: Pt with decreased foot clearance LLE using steppage gait pattern to advance LLE, encouraged arm swing bilaterally. Left knee instability noted as well. 1 seated rest break. Min A for balance. Incoordination noted LLE. Gait velocity: decreased Gait velocity interpretation: at or above normal speed for age/gender    Posture / Balance Dynamic Sitting Balance Sitting balance - Comments: Able to reach outside BoS and donn/doff socks with Min A to bring LLE up x2. Encouraged use of LUE as well.  Balance Overall balance assessment: Needs assistance Sitting-balance support: Feet supported, No upper extremity supported Sitting balance-Leahy Scale: Good Sitting balance - Comments: Able to reach outside BoS and donn/doff socks with Min A to bring LLE up x2. Encouraged use of LUE as well.  Standing balance support: During functional activity Standing balance-Leahy Scale: Fair Standing balance comment: able to maintain static standing briefly with Min guard, requires Min A for dynamic standing.     Special needs/care consideration BiPAP/CPAP: No CPM: No Continuous Drip IV: No Dialysis: No         Life Vest: No Oxygen: No Special Bed: No Trach Size: No Wound Vac (area): No       Skin: WDL                                Bowel mgmt: Continent, last BM 10/24/17 Bladder mgmt: Continent  Diabetic mgmt: Prediabetic, HgbA1c 6.9, goal <7.0     Previous Home Environment Living Arrangements: Spouse/significant other  Lives With: Spouse Available Help at Discharge: Family, Available 24 hours/day Type of Home: House Home Layout: One level Home  Access: Level entry Bathroom Shower/Tub: Walk-in Radio producer: Standard Home Care Services: No  Discharge Living Setting  Plans for Discharge Living Setting: Patient's home, Lives with (comment)(Spouse) Type of Home at Discharge: House Discharge Home Layout: One level Discharge Home Access: Stairs to enter Entrance Stairs-Rails: None Entrance Stairs-Number of Steps: 2 Discharge Bathroom Shower/Tub: Walk-in shower Discharge Bathroom Toilet: Standard Discharge Bathroom Accessibility: Yes How Accessible: Accessible via walker Does the patient have any problems obtaining your medications?: No  Social/Family/Support Systems Patient Roles: Spouse, Parent Contact Information: Ria Comment: [phone] Anticipated Caregiver: Spouse Anticipated Caregiver's Contact Information: see above  Ability/Limitations of Caregiver: None Caregiver Availability: 24/7 Discharge Plan Discussed with Primary Caregiver: Yes Is Caregiver In Agreement with Plan?: Yes Does Caregiver/Family have Issues with Lodging/Transportation while Pt is in Rehab?: No  Goals/Additional Needs Patient/Family Goal for Rehab: PT/OT: Supervision-Mod I Expected length of stay: 9-14 days  Cultural Considerations: [employer], [employer]  Dietary Needs: Low sodium, 2 grams  Equipment Needs: TBD Pt/Family Agrees to Admission and willing to participate: Yes Program Orientation Provided & Reviewed with Pt/Caregiver Including Roles  & Responsibilities: Yes Additional Information Needs: Sleep study as outpatient after IP Rehab  Information Needs to be Provided By: Team FYI  Decrease burden of Care through IP rehab admission: No  Possible need for SNF placement upon discharge: No  Patient Condition: This patient's condition remains as documented in the consult dated 10/24/17, in which the Rehabilitation Physician determined and documented that the patient's condition is appropriate for intensive rehabilitative  care in an inpatient rehabilitation facility. Will admit to inpatient rehab today.  Preadmission Screen Completed By:  Gunnar Fusi, 10/24/2017 3:46 PM ______________________________________________________________________   Discussed status with Dr. Posey Pronto on 10/24/17 at 1550 and received telephone approval for admission today.  Admission Coordinator:  Gunnar Fusi, time 1550/Date 10/24/17             Cosigned by: Jamse Arn, MD at 10/24/2017 3:54 PM  Revision History

## 2017-10-25 NOTE — Care Management Note (Signed)
Inpatient [facility] Individual Statement of Services  Patient Name:  James Pugh  Date:  10/25/2017  Welcome to the [facility].  Our goal is to provide you with an individualized program based on your diagnosis and situation, designed to meet your specific needs.  With this comprehensive rehabilitation program, you will be expected to participate in at least 3 hours of rehabilitation therapies Monday-Friday, with modified therapy programming on the weekends.  Your rehabilitation program will include the following services:  Physical Therapy (PT), Occupational Therapy (OT), Speech Therapy (ST), 24 hour per day rehabilitation nursing, Therapeutic Recreaction (TR), Case Management (Social Worker), Rehabilitation Medicine, Nutrition Services and Pharmacy Services  Weekly team conferences will be held on Wednesday to discuss your progress.  Your Social Worker will talk with you frequently to get your input and to update you on team discussions.  Team conferences with you and your family in attendance may also be held.  Expected length of stay: 9-11 days  Overall anticipated outcome: mod/i-supervision level  Depending on your progress and recovery, your program may change. Your Social Worker will coordinate services and will keep you informed of any changes. Your Social Worker's name and contact numbers are listed  below.  The following services may also be recommended but are not provided by the [facility] will be made to provide these services after discharge if needed.  Arrangements include referral to agencies that provide these services.  Your insurance has been verified to be:  [place] Your primary doctor is:  Carolann Littler  Pertinent information will be shared with your doctor and your insurance  company.  Social Worker:  Ovidio Kin, [place] or ([phone]  Information discussed with and copy given to patient by: Elease Hashimoto, 10/25/2017, 10:32 AM

## 2017-10-26 ENCOUNTER — Inpatient Hospital Stay (HOSPITAL_COMMUNITY): Payer: Medicare Other

## 2017-10-26 ENCOUNTER — Inpatient Hospital Stay (HOSPITAL_COMMUNITY): Payer: Medicare Other | Admitting: Occupational Therapy

## 2017-10-26 LAB — GLUCOSE, CAPILLARY
GLUCOSE-CAPILLARY: 101 mg/dL — AB (ref 65–99)
GLUCOSE-CAPILLARY: 111 mg/dL — AB (ref 65–99)
GLUCOSE-CAPILLARY: 96 mg/dL (ref 65–99)
GLUCOSE-CAPILLARY: 109 mg/dL — AB (ref 65–99)

## 2017-10-26 LAB — MRSA PCR SCREENING: MRSA by PCR: NEGATIVE

## 2017-10-26 MED ORDER — LISINOPRIL 5 MG PO TABS
2.5000 mg | ORAL_TABLET | Freq: Every day | ORAL | Status: DC
  Administered 2017-10-26 – 2017-10-31 (×6): 2.5 mg via ORAL
  Filled 2017-10-26 (×6): qty 1

## 2017-10-26 NOTE — Progress Notes (Signed)
[facility] PHYSICAL MEDICINE & REHABILITATION     PROGRESS NOTE  Subjective/Complaints:  Patient seen sitting up at the edge of the bed this morning. He states he slept well overnight. Wife at bedside. He states he slept from therapies yesterday.  ROS: Denies CP, SOB, N/V/D.  Objective: Vital Signs: Blood pressure (!) 149/72, pulse 60, temperature 98.5 F (36.9 C), temperature source Oral, resp. rate 17, height 5\' 6"  (1.676 m), weight 73.4 kg (161 lb 14.4 oz), SpO2 96 %. No results found. Recent Labs    10/24/17 2001  WBC 7.7  HGB 12.6*  HCT 37.5*  PLT 296   Recent Labs    10/24/17 1951 10/25/17 0528  NA  --  139  K  --  3.7  CL  --  105  GLUCOSE  --  120*  BUN  --  11  CREATININE 1.03 0.90  CALCIUM  --  9.2   CBG (last 3)  Recent Labs    10/25/17 1636 10/25/17 2101 10/26/17 0626  GLUCAP 90 109* 101*    Wt Readings from Last 3 Encounters:  10/24/17 73.4 kg (161 lb 14.4 oz)  10/22/17 76.2 kg (168 lb)  10/26/16 76.7 kg (169 lb 1.6 oz)    Physical Exam:  BP (!) 149/72 (BP Location: Left Arm)   Pulse 60   Temp 98.5 F (36.9 C) (Oral)   Resp 17   Ht 5\' 6"  (1.676 m)   Wt 73.4 kg (161 lb 14.4 oz)   SpO2 96%   BMI 26.13 kg/m  Constitutional: He appears well-developed and well-nourished.  HENT: Normocephalic and atraumatic.  Eyes: EOM are normal. No discharge.  Cardiovascular: RRR. No JVD. Respiratory: Effort normal and breath sounds normal.  GI: Bowel sounds are normal. Nondistended Musculoskeletal:  No edema or tenderness in extremities  Neurological: He is alert and oriented.  Motor: RUE/RLE: 5/5 proximal to distal LUE: 4/5 proximal to distal with ataxia  LLE: 4-/5 HF, KE, 2+/5 ADF/PF Skin: Skin is warm and dry.  Psychiatric: He has a normal mood and affect. His behavior is normal. Thought content normal.   Assessment/Plan: 1. Functional deficits secondary to acute perforator infarct in the lower right pons which require 3+ hours per day of  interdisciplinary therapy in a comprehensive inpatient rehab setting. Physiatrist is providing close team supervision and 24 hour management of active medical problems listed below. Physiatrist and rehab team continue to assess barriers to discharge/monitor patient progress toward functional and medical goals.  Function:  Bathing Bathing position   Position: Shower  Bathing parts Body parts bathed by patient: Right arm, Left arm, Chest, Abdomen, Front perineal area, Buttocks, Right upper leg, Left upper leg, Right lower leg, Left lower leg Body parts bathed by helper: Back  Bathing assist Assist Level: Touching or steadying assistance(Pt > 75%)      Upper Body Dressing/Undressing Upper body dressing   What is the patient wearing?: Pull over shirt/dress     Pull over shirt/dress - Perfomed by patient: Thread/unthread right sleeve, Thread/unthread left sleeve, Put head through opening, Pull shirt over trunk          Upper body assist Assist Level: Touching or steadying assistance(Pt > 75%)(in standing)      Lower Body Dressing/Undressing Lower body dressing   What is the patient wearing?: Underwear, Pants, Non-skid slipper socks, Shoes Underwear - Performed by patient: Thread/unthread right underwear leg, Thread/unthread left underwear leg, Pull underwear up/down   Pants- Performed by patient: Thread/unthread right pants leg,  Thread/unthread left pants leg, Pull pants up/down   Non-skid slipper socks- Performed by patient: Don/doff right sock, Don/doff left sock       Shoes - Performed by patient: Don/doff right shoe, Don/doff left shoe, Fasten right, Fasten left            Lower body assist Assist for lower body dressing: Touching or steadying assistance (Pt > 75%)      Toileting Toileting   Toileting steps completed by patient: Adjust clothing prior to toileting, Performs perineal hygiene, Adjust clothing after toileting   Toileting Assistive Devices: Grab bar or  rail  Toileting assist Assist level: Touching or steadying assistance (Pt.75%)   Transfers Chair/bed transfer   Chair/bed transfer method: Stand pivot Chair/bed transfer assist level: Touching or steadying assistance (Pt > 75%) Chair/bed transfer assistive device: Armrests     Locomotion Ambulation     Max distance: 150 ft Assist level: Moderate assist (Pt 50 - 74%)   Wheelchair          Cognition Comprehension Comprehension assist level: Follows complex conversation/direction with extra time/assistive device  Expression Expression assist level: Expresses complex ideas: With extra time/assistive device  Social Interaction Social Interaction assist level: Interacts appropriately 90% of the time - Needs monitoring or encouragement for participation or interaction.  Problem Solving Problem solving assist level: Solves basic 90% of the time/requires cueing < 10% of the time  Memory Memory assist level: More than reasonable amount of time    Medical Problem List and Plan: 1.  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  DVT Prophylaxis/Anticoagulation: Subcutaneous Lovenox. Monitor for any bleeding episodes 3. Pain Management: Tylenol as needed 4. Mood: Provide emotional support 5. Neuropsych: This patient is capable of making decisions on his own behalf. 6. Skin/Wound Care: Routine skin checks 7. Fluids/Electrolytes/Nutrition: Routine I&O's    BMP within acceptable range on 2/12 8. Hyperlipidemia. Lipitor 9. Diabetes Mellitus type 2. Hemoglobin A1c 6.9.    Relatively controlled on 2/13 10. Recent cataract surgery. Follow-up outpatient 11. Constipation. Laxative assistance 12. HTN   Lisinopril 2.5 started on 2/13   Monitor with increased mobility 13. Hypoalbuminemia   Supplement initated on 2/12 14. ABLA   Hb 12.6 on 2/11    Cont to monitor  LOS (Days) 2 A FACE TO FACE EVALUATION WAS PERFORMED  Catherin Doorn Lorie Phenix 10/26/2017 8:50 AM

## 2017-10-26 NOTE — Progress Notes (Signed)
Occupational Therapy Session Note  Patient Details  Name: DOMNICK CHERVENAK MRN: [id number] Date of Birth: 1946/03/18  Today's Date: 10/26/2017 OT Individual Time: 6578-4696 OT Individual Time Calculation (min): 30 min    Short Term Goals: Week 1:  OT Short Term Goal 1 (Week 1): Pt will perform grooming in standing wtih supervision  OT Short Term Goal 2 (Week 1): Pt will obtain all clothing items from different height surfaces with supervision  OT Short Term Goal 3 (Week 1): Pt will perform toileting with mod I  OT Short Term Goal 4 (Week 1): Pt will perform floor transfers with supervision   Skilled Therapeutic Interventions/Progress Updates:    Pt completed functional mobility to the therapy gym with min assist level.  Once in the gym had pt work on LUE coordination and reach using nuts and bolts activity.  Applied 2lb weight while having pt stand and sit to place nuts on bolt and tighten.  Multiple rest breaks needed and pt also needing mod facilitation to maintain midline posture and not lean to the right.  Pt reporting nausea as well with nursing notified and meds given.  Occasional min assist needed to help him start the nut with increased time to complete tightening it.  Finished session with return to the room and call button and pt left sitting EOB with family present in preparation for PT session.    Therapy Documentation Precautions:  Precautions Precautions: Fall Restrictions Weight Bearing Restrictions: No  Pain:   No report of pain just nausea  ADL: See Function Navigator for Current Functional Status.   Therapy/Group: Individual Therapy  Saabir Blyth OTR/L 10/26/2017, 3:39 PM

## 2017-10-26 NOTE — Progress Notes (Signed)
Physical Therapy Session Note  Patient Details  Name: James Pugh MRN: [id number] Date of Birth: 09/07/1946  Today's Date: 10/26/2017 PT Individual Time: 1001-1058, 1638-4536 PT Individual Time Calculation (min): 57 min , 42 min   Short Term Goals: Week 1:  PT Short Term Goal 1 (Week 1): STG=LTG due to ELOS  Skilled Therapeutic Interventions/Progress Updates:    Session 1: Pt supine in bed upon PT arrival, agreeable to therapy tx and denies pain. Pt transferred from supine>sitting EOB with supervision. Pt ambulated from room>gym x 200 ft with min assist, verbal cues for symmetric step length and L toe clearance. Pt ambulated x 160 ft without AD and min assist, DF ace wrap for toe clearance. Pt worked on L LE NMR to perform anterior and lateral step ups on 6 inch step, verbal cues for techniques and eccentric control. Pt performed 2 x 10 sit<>stands with R LE on dynadisc for forced use of L LE for neuro re-ed, verbal cues for control. Pt worked on hip strengthening and L LE neuro re-ed to perform side stepping 15 ft x 4 in each direction with level 2 theraband around LEs for resistance. Pt in quadruped for neuro re-ed working on hip extension with bilateral LEs x 10 each side, pt in tall kneeling working on side stepping each direction. Pt worked on dynamic standing balance to perform backwards ambulation 2 x 20 ft and tandem walking 2 x 20 ft. Pt ambulated back to room and left seated EOB in care of wife.   Session 2: Pt seated in bed upon PT arrival, agreeable to therapy tx and denies pain. Pt ambulated from room>gym with min assist, no AD. Pt performed step ups without UE support on 6 inch step for neuro re-ed and balance. Pt ascended/descended 8 steps, x 1 without handrails and x1 with handrails. Pt's wife asking about the need for a rail on their garage steps, therapist recommended just doing a grab bar. Pt ambulated 2 x 100 ft with DF assist from ace wrap, contact guard. Pt worked on dynamic  balance to perform obstacle course ambulating around cones, stepping onto step and stepping over objects, x 3 trials. Pt ambulated back to room and left seated EOB in care of his wife.   Therapy Documentation Precautions:  Precautions Precautions: Fall Restrictions Weight Bearing Restrictions: No   See Function Navigator for Current Functional Status.   Therapy/Group: Individual Therapy  Netta Corrigan, PT, DPT 10/26/2017, 7:52 AM

## 2017-10-26 NOTE — Patient Care Conference (Signed)
Inpatient RehabilitationTeam Conference and Plan of Care Update Date: 10/26/2017   Time: 2:15 PM    Patient Name: James Pugh      Medical Record Number: [id number]  Date of Birth: [birth year] Sex: Male         Room/Bed: 4M09C/4M09C-01 Payor Info: Payor: MEDICARE / Plan: MEDICARE PART A AND B / Product Type: *No Product type* /    Admitting Diagnosis: TIA  Admit Date/Time:  10/24/2017  5:54 PM Admission Comments: No comment available   Primary Diagnosis:  <principal problem not specified> Principal Problem: <principal problem not specified>  Patient Active Problem List   Diagnosis Date Noted  . Acute blood loss anemia   . Hypoalbuminemia due to protein-calorie malnutrition ([place])   . Reactive hypertension   . Right pontine cerebrovascular accident ([place]) 10/24/2017  . Acute CVA (cerebrovascular accident) ([place])   . Dysarthria, post-stroke   . Hyperlipidemia   . Diabetes mellitus type 2 in nonobese ([facility])   . Benign essential HTN   . Status post cataract extraction   . OSA (obstructive sleep apnea)   . CVA (cerebral vascular accident) ([place]) 10/22/2017  . PREDIABETES 07/13/2010  . CERUMEN IMPACTION 08/22/2009  . Dyslipidemia 06/06/2009    Expected Discharge Date: Expected Discharge Date: 10/31/17  Team Members Present: Physician leading conference: Dr. Delice Lesch Social Worker Present: Ovidio Kin, LCSW Nurse Present: Rayetta Humphrey, RN PT Present: Michaelene Song, PT OT Present: Clyda Greener, OT PPS Coordinator present : Daiva Nakayama, RN, CRRN     Current Status/Progress Goal Weekly Team Focus  Medical   Left side weakness secondary to acute perforator infarct in the lower right pons  Improve mobility, safety, BP/DM  See above   Bowel/Bladder   continent of bowel & bladder, LBM 10/25/17  remain continent  continue to monitor for changes & assist as needed   Swallow/Nutrition/ Hydration             ADL's   Pt is supervision for UB selfcare with min assist for LB  selfcare and for transfers to the walk-in shower and to the toilet  modified independent overall  selfcare retraining, balance retraining, transfer training, neuromuscular re-education, pt/family education, therapeutic exercise   Mobility   supervision bed mobility, min assist transfers and gait without AD, min assist steps  supervision to Mod I  balance, gait without AD, stairs, L NMR   Communication             Safety/Cognition/ Behavioral Observations            Pain   no c/o pain since admission, has tylenol prn  pain scale <4  continue to assess & treat as needed   Skin   blood blister from home trauma to the right 3rd toe  no new areas of skin break down  assess q shift      *See Care Plan and progress notes for long and short-term goals.     Barriers to Discharge  Current Status/Progress Possible Resolutions Date Resolved   Physician    Medical stability     See above  Therapies, optimize DM/BP meds      Nursing                  PT                    OT                  SLP  SW                Discharge Planning/Teaching Needs:  Home with wife who can provide care and daughter's are involved and supportive. Should do well here.      Team Discussion:  Goals of mod/i level. Left UE weakness and decreased coordination working on this. Balance issues and tends to drag left toe. New diabetic and teaching pt and wife this and MD is adjusting BP. Wife and daughter checked off with transfers and taking to the bathroom.   Revisions to Treatment Plan:  DC 2/18    Continued Need for Acute Rehabilitation Level of Care: The patient requires daily medical management by a physician with specialized training in physical medicine and rehabilitation for the following conditions: Daily direction of a multidisciplinary physical rehabilitation program to ensure safe treatment while eliciting the highest outcome that is of practical value to the patient.: Yes Daily  medical management of patient stability for increased activity during participation in an intensive rehabilitation regime.: Yes Daily analysis of laboratory values and/or radiology reports with any subsequent need for medication adjustment of medical intervention for : Neurological problems;Diabetes problems;Blood pressure problems  Ellen Mayol, Gardiner Rhyme 10/26/2017, 3:52 PM

## 2017-10-26 NOTE — Progress Notes (Signed)
Occupational Therapy Session Note  Patient Details  Name: James Pugh MRN: [id number] Date of Birth: 01-21-1946  Today's Date: 10/26/2017 OT Individual Time: 5027-7412 OT Individual Time Calculation (min): 56 min    Short Term Goals: Week 1:  OT Short Term Goal 1 (Week 1): Pt will perform grooming in standing wtih supervision  OT Short Term Goal 2 (Week 1): Pt will obtain all clothing items from different height surfaces with supervision  OT Short Term Goal 3 (Week 1): Pt will perform toileting with mod I  OT Short Term Goal 4 (Week 1): Pt will perform floor transfers with supervision   Skilled Therapeutic Interventions/Progress Updates:    Pt completed shower and dressing to start session.  He was able to ambulate around the room to the shower as well as for gathering clothes with min guard assist and no assistive device.  He was able to complete shower with min guard assist in standing and one LOB.  He used the grab bars for support in standing when attempting to wash his feet.  He also stood to complete all drying with supervision as well.  Dressing sit to stand at the EOB with supervision overall.  Once completed he ambulated down to the therapy gym with min facilitation for trunk and hip control on the left side.  Worked on strengthening of the left hip in standing using a 5" step and having pt step up with the RLE and stabilize his left hip and knee in the process.  Mod assist for standing balance to complete this.  Also had pt sit and work on LUE coordination with small ball toss and catch with therapist as well as tossing it up to himself.  He was able to complete toss and catch with therapist 75% and to himself at 60-70%.  Finished session with ambulation back to the room with min facilitation.  Pt left in bed with bed alarm in place and call button and phone in reach.    Therapy Documentation Precautions:  Precautions Precautions: Fall Restrictions Weight Bearing Restrictions:  No  Pain: Pain Assessment Pain Assessment: No/denies pain Pain Score: 0-No pain ADL: See Function Navigator for Current Functional Status.   Therapy/Group: Individual Therapy  Eutimio Gharibian OTR/L 10/26/2017, 12:09 PM

## 2017-10-26 NOTE — Progress Notes (Signed)
Social Work   Britlee Skolnik, Eliezer Champagne  Social Worker  Physical Medicine and Rehabilitation  Patient Care Conference  Signed  Date of Service:  10/26/2017  3:51 PM          Signed          [] Hide copied text  [] Hover for details   Inpatient RehabilitationTeam Conference and Plan of Care Update Date: 10/26/2017   Time: 2:15 PM      Patient Name: JOSEPHUS HARRIGER      Medical Record Number: [id number]  Date of Birth: [birth year] Sex: Male         Room/Bed: 4M09C/4M09C-01 Payor Info: Payor: MEDICARE / Plan: MEDICARE PART A AND B / Product Type: *No Product type* /     Admitting Diagnosis: TIA  Admit Date/Time:  10/24/2017  5:54 PM Admission Comments: No comment available    Primary Diagnosis:  <principal problem not specified> Principal Problem: <principal problem not specified>       Patient Active Problem List    Diagnosis Date Noted  . Acute blood loss anemia    . Hypoalbuminemia due to protein-calorie malnutrition ([place])    . Reactive hypertension    . Right pontine cerebrovascular accident ([place]) 10/24/2017  . Acute CVA (cerebrovascular accident) ([place])    . Dysarthria, post-stroke    . Hyperlipidemia    . Diabetes mellitus type 2 in nonobese ([facility])    . Benign essential HTN    . Status post cataract extraction    . OSA (obstructive sleep apnea)    . CVA (cerebral vascular accident) ([place]) 10/22/2017  . PREDIABETES 07/13/2010  . CERUMEN IMPACTION 08/22/2009  . Dyslipidemia 06/06/2009      Expected Discharge Date: Expected Discharge Date: 10/31/17   Team Members Present: Physician leading conference: Dr. Delice Lesch Social Worker Present: Ovidio Kin, LCSW Nurse Present: Rayetta Humphrey, RN PT Present: Michaelene Song, PT OT Present: Clyda Greener, OT PPS Coordinator present : Daiva Nakayama, RN, CRRN       Current Status/Progress Goal Weekly Team Focus  Medical     Left side weakness secondary to acute perforator infarct in the lower right pons  Improve  mobility, safety, BP/DM  See above   Bowel/Bladder     continent of bowel & bladder, LBM 10/25/17  remain continent  continue to monitor for changes & assist as needed   Swallow/Nutrition/ Hydration               ADL's     Pt is supervision for UB selfcare with min assist for LB selfcare and for transfers to the walk-in shower and to the toilet  modified independent overall  selfcare retraining, balance retraining, transfer training, neuromuscular re-education, pt/family education, therapeutic exercise   Mobility     supervision bed mobility, min assist transfers and gait without AD, min assist steps  supervision to Mod I  balance, gait without AD, stairs, L NMR   Communication               Safety/Cognition/ Behavioral Observations             Pain     no c/o pain since admission, has tylenol prn  pain scale <4  continue to assess & treat as needed   Skin     blood blister from home trauma to the right 3rd toe  no new areas of skin break down  assess q shift     *See Care Plan and progress notes for  long and short-term goals.      Barriers to Discharge   Current Status/Progress Possible Resolutions Date Resolved   Physician     Medical stability     See above  Therapies, optimize DM/BP meds      Nursing                 PT                    OT                 SLP            SW              Discharge Planning/Teaching Needs:  Home with wife who can provide care and daughter's are involved and supportive. Should do well here.      Team Discussion:  Goals of mod/i level. Left UE weakness and decreased coordination working on this. Balance issues and tends to drag left toe. New diabetic and teaching pt and wife this and MD is adjusting BP. Wife and daughter checked off with transfers and taking to the bathroom.   Revisions to Treatment Plan:  DC 2/18    Continued Need for Acute Rehabilitation Level of Care: The patient requires daily medical management by a physician  with specialized training in physical medicine and rehabilitation for the following conditions: Daily direction of a multidisciplinary physical rehabilitation program to ensure safe treatment while eliciting the highest outcome that is of practical value to the patient.: Yes Daily medical management of patient stability for increased activity during participation in an intensive rehabilitation regime.: Yes Daily analysis of laboratory values and/or radiology reports with any subsequent need for medication adjustment of medical intervention for : Neurological problems;Diabetes problems;Blood pressure problems   Elease Hashimoto 10/26/2017, 3:52 PM                 Patient ID: JADARIUS COMMONS, male   DOB: Oct 03, 1945, 72 y.o.   MRN: [id number]

## 2017-10-26 NOTE — Progress Notes (Signed)
Social Work Patient ID: James Pugh, male   DOB: 1945/11/13, 72 y.o.   MRN: [id number]  Met with pt, wife and daughter who was here to inform team conference goals mod/i level and target discharge date 2/18. He is not feeling ready right now but hopes to build his confidence by Monday. Discussed OP therapies recommended by team and no equipment needs. Wife has been here daily along with both of their daughter's. Will work toward discharge Monday.

## 2017-10-27 ENCOUNTER — Inpatient Hospital Stay (HOSPITAL_COMMUNITY): Payer: Medicare Other | Admitting: Occupational Therapy

## 2017-10-27 ENCOUNTER — Inpatient Hospital Stay (HOSPITAL_COMMUNITY): Payer: Medicare Other | Admitting: Physical Therapy

## 2017-10-27 LAB — GLUCOSE, CAPILLARY
GLUCOSE-CAPILLARY: 118 mg/dL — AB (ref 65–99)
GLUCOSE-CAPILLARY: 106 mg/dL — AB (ref 65–99)
Glucose-Capillary: 112 mg/dL — ABNORMAL HIGH (ref 65–99)
Glucose-Capillary: 107 mg/dL — ABNORMAL HIGH (ref 65–99)

## 2017-10-27 NOTE — Progress Notes (Signed)
Occupational Therapy Session Note  Patient Details  Name: James Pugh MRN: [id number] Date of Birth: August 09, 1946  Today's Date: 10/27/2017 OT Individual Time: 9211-9417 OT Individual Time Calculation (min): 59 min    Short Term Goals: Week 1:  OT Short Term Goal 1 (Week 1): Pt will perform grooming in standing wtih supervision  OT Short Term Goal 2 (Week 1): Pt will obtain all clothing items from different height surfaces with supervision  OT Short Term Goal 3 (Week 1): Pt will perform toileting with mod I  OT Short Term Goal 4 (Week 1): Pt will perform floor transfers with supervision   Skilled Therapeutic Interventions/Progress Updates:    Pt completed shower and dressing to start session.  He was able to ambulate to the shower and undress in standing with supervision.  Bathing completed at the same level with use of the grab bars for support when lifting each LE for washing the opposite foot.  He completed dressing sit to stand at the EOB with supervision and then ambulated down to the therapy gym, without assistive device and supervision.  In the gym worked in [name] on LUE weightbearing tasks.  Had pt reaching across his body in quadriped with the RUE and forcing the LUE to support his weight.  Also completed 2 sets of 10 repetitions for modified pushup as well.  Progressed to sitting with pt working on picking up and placing pegs with the left hand.  Had him work on Product/process development scientist to pick them up and place them.   Occasional drops noted and pt tends to flex trunk and head to the right to compensate.  Also had him complete shoulder flexion to remove pegs as well.  Finished session with ambulation back to the room with supervision. Pt given checkers to work on LUE coordination on his own.  Pt left sitting EOB with daughter and spouse present for supervision.    Therapy Documentation Precautions:  Precautions Precautions: Fall Restrictions Weight Bearing Restrictions:  No Pain Pain Assessment Pain Assessment: No/denies pain ADL: See Function Section of chart for details  Therapy/Group: Individual Therapy  [name] OTR/L  10/27/2017, 10:15 AM

## 2017-10-27 NOTE — Progress Notes (Signed)
James Pugh PHYSICAL MEDICINE & REHABILITATION     PROGRESS NOTE  Subjective/Complaints:  Patient seen sitting up in bed this morning eating breakfast. Wife at bedside. He states he slept well overnight. He states he is doing well.  ROS: Denies CP, SOB, N/V/D.  Objective: Vital Signs: Blood pressure (!) 142/61, pulse (!) 58, temperature 98.1 F (36.7 C), temperature source Oral, resp. rate 16, height 5\' 6"  (1.676 m), weight 73.4 kg (161 lb 14.4 oz), SpO2 97 %. No results found. Recent Labs    10/24/17 2001  WBC 7.7  HGB 12.6*  HCT 37.5*  PLT 296   Recent Labs    10/24/17 1951 10/25/17 0528  NA  --  139  K  --  3.7  CL  --  105  GLUCOSE  --  120*  BUN  --  11  CREATININE 1.03 0.90  CALCIUM  --  9.2   CBG (last 3)  Recent Labs    10/26/17 1636 10/26/17 2106 10/27/17 0703  GLUCAP 96 109* 118*    Wt Readings from Last 3 Encounters:  10/24/17 73.4 kg (161 lb 14.4 oz)  10/22/17 76.2 kg (168 lb)  10/26/16 76.7 kg (169 lb 1.6 oz)    Physical Exam:  BP (!) 142/61 (BP Location: Left Arm)   Pulse (!) 58   Temp 98.1 F (36.7 C) (Oral)   Resp 16   Ht 5\' 6"  (1.676 m)   Wt 73.4 kg (161 lb 14.4 oz)   SpO2 97%   BMI 26.13 kg/m  Constitutional: He appears well-developed and well-nourished.  HENT: Normocephalic and atraumatic.  Eyes: EOM are normal. No discharge.  Cardiovascular: RRR. No JVD. Respiratory: Effort normal and breath sounds normal.  GI: Bowel sounds are normal. Nondistended Musculoskeletal:  No edema or tenderness in extremities  Neurological: He is alert and oriented.  Motor:  LUE: 4/5 proximal to distal with ataxia  LLE: 4-/5 HF, KE, 3/5 ADF/PF Skin: Skin is warm and dry.  Psychiatric: He has a normal mood and affect. His behavior is normal. Thought content normal.   Assessment/Plan: 1. Functional deficits secondary to acute perforator infarct in the lower right pons which require 3+ hours per day of interdisciplinary therapy in a comprehensive  inpatient rehab setting. Physiatrist is providing close team supervision and 24 hour management of active medical problems listed below. Physiatrist and rehab team continue to assess barriers to discharge/monitor patient progress toward functional and medical goals.  Function:  Bathing Bathing position   Position: Shower  Bathing parts Body parts bathed by patient: Right arm, Left arm, Chest, Abdomen, Front perineal area, Buttocks, Right upper leg, Left upper leg, Right lower leg, Left lower leg Body parts bathed by helper: Back  Bathing assist Assist Level: Touching or steadying assistance(Pt > 75%)      Upper Body Dressing/Undressing Upper body dressing   What is the patient wearing?: Pull over shirt/dress     Pull over shirt/dress - Perfomed by patient: Thread/unthread right sleeve, Thread/unthread left sleeve, Put head through opening, Pull shirt over trunk          Upper body assist Assist Level: Supervision or verbal cues      Lower Body Dressing/Undressing Lower body dressing   What is the patient wearing?: Underwear, Pants, Non-skid slipper socks, Shoes Underwear - Performed by patient: Thread/unthread right underwear leg, Thread/unthread left underwear leg, Pull underwear up/down   Pants- Performed by patient: Thread/unthread right pants leg, Thread/unthread left pants leg, Pull pants up/down  Non-skid slipper socks- Performed by patient: Don/doff right sock, Don/doff left sock       Shoes - Performed by patient: Don/doff right shoe, Don/doff left shoe, Fasten right, Fasten left            Lower body assist Assist for lower body dressing: Touching or steadying assistance (Pt > 75%)      Toileting Toileting   Toileting steps completed by patient: Adjust clothing prior to toileting, Performs perineal hygiene, Adjust clothing after toileting   Toileting Assistive Devices: Grab bar or rail  Toileting assist Assist level: Touching or steadying assistance  (Pt.75%)   Transfers Chair/bed transfer   Chair/bed transfer method: Ambulatory Chair/bed transfer assist level: Touching or steadying assistance (Pt > 75%) Chair/bed transfer assistive device: Armrests     Locomotion Ambulation     Max distance: 150 ft Assist level: Touching or steadying assistance (Pt > 75%)   Wheelchair          Cognition Comprehension Comprehension assist level: Follows complex conversation/direction with extra time/assistive device  Expression Expression assist level: Expresses complex ideas: With extra time/assistive device  Social Interaction Social Interaction assist level: Interacts appropriately 90% of the time - Needs monitoring or encouragement for participation or interaction.  Problem Solving Problem solving assist level: Solves basic 90% of the time/requires cueing < 10% of the time  Memory Memory assist level: Recognizes or recalls 75 - 89% of the time/requires cueing 10 - 24% of the time    Medical Problem List and Plan: 1.  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  DVT Prophylaxis/Anticoagulation: Subcutaneous Lovenox. Monitor for any bleeding episodes 3. Pain Management: Tylenol as needed 4. Mood: Provide emotional support 5. Neuropsych: This patient is capable of making decisions on his own behalf. 6. Skin/Wound Care: Routine skin checks 7. Fluids/Electrolytes/Nutrition: Routine I&O's    BMP within acceptable range on 2/12 8. Hyperlipidemia. Lipitor 9. Diabetes Mellitus type 2. Hemoglobin A1c 6.9.    Relatively controlled on 2/14 10. Recent cataract surgery. Follow-up outpatient 11. Constipation. Laxative assistance 12. HTN   Lisinopril 2.5 started on 2/13   Remains elevated, will consider further increase tomorrow 13. Hypoalbuminemia   Supplement initated on 2/12 14. ABLA   Hb 12.6 on 2/11    Cont to monitor  LOS (Days) 3 A FACE TO FACE EVALUATION WAS PERFORMED  James Pugh  James Pugh 10/27/2017 9:02 AM

## 2017-10-27 NOTE — Progress Notes (Signed)
Physical Therapy Session Note  Patient Details  Name: James Pugh MRN: [id number] Date of Birth: 01-25-1946  Today's Date: 10/27/2017 PT Individual Time: 1000-1100 1400-1505 PT Individual Time Calculation (min): 60 min AND 65 min  Short Term Goals: Week 1:  PT Short Term Goal 1 (Week 1): STG=LTG due to ELOS  Skilled Therapeutic Interventions/Progress Updates:   Session 1:  Pt finishing w/ toileting w/ wife, agreeable to therapy and no c/o pain. Pt ambulated around unit in multiple 150-300' bouts w/ close supervision for safety to work on gait training. Verbal cues for gait pattern, emphasizing heel-to-toe pattern and increased DF control on L side. Trial-ed use of foot up to assist w/ DF on L side, however pt continues to ambulate w/ L foot flat and L toe drag w/ fatigue. No noticeable different w/ or w/o foot up brace. Performed NuStep 5 min @ L2 for LE strengthening in reciprocal movement pattern. Performed L single leg balance tasks on compliant surface w/ min guard for safety while stepping w/ RLE to step, emphasis on L ankle stability and proprioception. Performed L stepping forward and backward activities w/ emphasis on heel touch only to work on DF control. Returned to room and ended session sitting EOB and in care of wife, all needs met.   Session 2:  Pt in supine and agreeable to therapy, no c/o pain. Ambulated around unit in 150-300' bouts w/ supervision and verbal cues for gait pattern as listed above. Focused on NMR this session w/ dynamic standing balance and forced used of LLE for maintaining balance. Performed Wii tennis and bowling games w/ close supervision and verbal cues for balance strategies including hip and stepping strategies. Tossed balance while standing on compliant surface w/ close supervision. Performed static standing tasks w/ prolonged forced weight shift to LLE while performing bimanual task, close supervision for safety. Performed multiple sit<>stands from mat on  compliant surface w/ verbal cues to stabilize independently in stance. Performed dynamic gait tasks including kicking yoga block w/ BLEs, carrying full coffee mug, and picking up objects from floor, close supervision for both tasks. Challenged carrying coffee mug by simulating home environment w/ both firm and compliant surfaces and stepping around and over objects. Returned to room and ended session sitting EOB, call bell within reach and all needs met.   Therapy Documentation Precautions:  Precautions Precautions: Fall Restrictions Weight Bearing Restrictions: No Pain: Pain Assessment Pain Assessment: No/denies pain  See Function Navigator for Current Functional Status.   Therapy/Group: Individual Therapy  [name] K Arnette 10/27/2017, 12:54 PM

## 2017-10-28 ENCOUNTER — Inpatient Hospital Stay (HOSPITAL_COMMUNITY): Payer: Medicare Other | Admitting: Occupational Therapy

## 2017-10-28 ENCOUNTER — Inpatient Hospital Stay (HOSPITAL_COMMUNITY): Payer: Medicare Other | Admitting: Physical Therapy

## 2017-10-28 LAB — GLUCOSE, CAPILLARY
GLUCOSE-CAPILLARY: 107 mg/dL — AB (ref 65–99)
Glucose-Capillary: 97 mg/dL (ref 65–99)
Glucose-Capillary: 86 mg/dL (ref 65–99)
Glucose-Capillary: 106 mg/dL — ABNORMAL HIGH (ref 65–99)

## 2017-10-28 NOTE — Discharge Instructions (Signed)
Inpatient Rehab Discharge Instructions  James Pugh Discharge date and time: No discharge date for patient encounter.   Activities/Precautions/ Functional Status: Activity: activity as tolerated Diet: regular diet Wound Care: none needed Functional status:  ___ No restrictions     ___ Walk up steps independently ___ 24/7 supervision/assistance   ___ Walk up steps with assistance ___ Intermittent supervision/assistance  ___ Bathe/dress independently ___ Walk with walker     ___ Bathe/dress with assistance ___ Walk Independently    ___ Shower independently ___ Walk with assistance    ___ Shower with assistance ___ No alcohol     ___ Return to work/school ________  Special Instructions:    COMMUNITY REFERRALS UPON DISCHARGE:    Outpatient: PT  & OT  Agency:[facility] Phone:[phone]   Date of Last Service:10/31/2017  Appointment Date/Time:FEB 25 Monday 9:15-11:00 BOTH PT & OT  Medical Equipment/Items Ordered:NO NEEDS  Agency/Supplier:  GENERAL COMMUNITY RESOURCES FOR PATIENT/FAMILY: Support Groups:[facility] SUPPORT GROUP EVERY SECOND Thursday @ 3:00-4:00 PM ON THE REHAB UNIT QUESTIONS CONTACT CAITLIN [phone]  My questions have been answered and I understand these instructions. I will adhere to these goals and the provided educational materials after my discharge from the hospital.  Patient/Caregiver Signature _______________________________ Date __________  Clinician Signature _______________________________________ Date __________  Please bring this form and your medication list with you to all your follow-up doctor's appointments. Inpatient Rehab Discharge Instructions  James Pugh Discharge date and time: No discharge date for patient encounter.   Activities/Precautions/ Functional Status: Activity: activity as tolerated Diet: regular diet Wound Care: None needed Functional status:  ___ No restrictions     ___ Walk up steps  independently ___ 24/7 supervision/assistance   ___ Walk up steps with assistance ___ Intermittent supervision/assistance  ___ Bathe/dress independently ___ Walk with walker     _x__ Bathe/dress with assistance ___ Walk Independently    ___ Shower independently ___ Walk with assistance    ___ Shower with assistance ___ No alcohol     ___ Return to work/school ________  Special Instructions: No driving STROKE/TIA DISCHARGE INSTRUCTIONS SMOKING Cigarette smoking nearly doubles your risk of having a stroke & is the single most alterable risk factor  If you smoke or have smoked in the last 12 months, you are advised to quit smoking for your health.  Most of the excess cardiovascular risk related to smoking disappears within a year of stopping.  Ask you doctor about anti-smoking medications  [employer] Quit Line: 1-800-QUIT NOW  Free Smoking Cessation Classes [phone]  CHOLESTEROL Know your levels; limit fat & cholesterol in your diet  Lipid Panel     Component Value Date/Time   CHOL 196 10/23/2017 0419   TRIG 131 10/23/2017 0419   HDL 28 (L) 10/23/2017 0419   CHOLHDL 7.0 10/23/2017 0419   VLDL 26 10/23/2017 0419   LDLCALC 142 (H) 10/23/2017 0419      Many patients benefit from treatment even if their cholesterol is at goal.  Goal: Total Cholesterol (CHOL) less than 160  Goal:  Triglycerides (TRIG) less than 150  Goal:  HDL greater than 40  Goal:  LDL (LDLCALC) less than 100   BLOOD PRESSURE American Stroke Association blood pressure target is less that 120/80 mm/Hg  Your discharge blood pressure is:  BP: (!) 141/71  Monitor your blood pressure  Limit your salt and alcohol intake  Many individuals will require more than one medication for high blood pressure  DIABETES (A1c is a blood sugar  average for last 3 months) Goal HGBA1c is under 7% (HBGA1c is blood sugar average for last 3 months)  Diabetes: No known diagnosis of diabetes    Lab Results  Component Value Date    HGBA1C 6.9 (H) 10/23/2017     Your HGBA1c can be lowered with medications, healthy diet, and exercise.  Check your blood sugar as directed by your physician  Call your physician if you experience unexplained or low blood sugars.  PHYSICAL ACTIVITY/REHABILITATION Goal is 30 minutes at least 4 days per week  Activity: Increase activity slowly, Therapies: Physical Therapy: Home Health Return to work:   Activity decreases your risk of heart attack and stroke and makes your heart stronger.  It helps control your weight and blood pressure; helps you relax and can improve your mood.  Participate in a regular exercise program.  Talk with your doctor about the best form of exercise for you (dancing, walking, swimming, cycling).  DIET/WEIGHT Goal is to maintain a healthy weight  Your discharge diet is: Fall precautions Diet 2 gram sodium Room service appropriate? Yes; Fluid consistency: Thin  liquids Your height is:  Height: 5\' 6"  (167.6 cm) Your current weight is: Weight: 73.4 kg (161 lb 14.4 oz) Your Body Mass Index (BMI) is:  BMI (Calculated): 26.14  Following the type of diet specifically designed for you will help prevent another stroke.  Your goal weight range is:    Your goal Body Mass Index (BMI) is 19-24.  Healthy food habits can help reduce 3 risk factors for stroke:  High cholesterol, hypertension, and excess weight.  RESOURCES Stroke/Support Group:  Call [phone]   STROKE EDUCATION PROVIDED/REVIEWED AND GIVEN TO PATIENT Stroke warning signs and symptoms How to activate emergency medical system (call 911). Medications prescribed at discharge. Need for follow-up after discharge. Personal risk factors for stroke. Pneumonia vaccine given:  Flu vaccine given:  My questions have been answered, the writing is legible, and I understand these instructions.  I will adhere to these goals & educational materials that have been provided to me after my discharge from the hospital.       My questions have been answered and I understand these instructions. I will adhere to these goals and the provided educational materials after my discharge from the hospital.  Patient/Caregiver Signature _______________________________ Date __________  Clinician Signature _______________________________________ Date __________  Please bring this form and your medication list with you to all your follow-up doctor's appointments.

## 2017-10-28 NOTE — Progress Notes (Signed)
Physical Therapy Session Note  Patient Details  Name: James Pugh MRN: [id number] Date of Birth: 03-Dec-1945  Today's Date: 10/28/2017 PT Individual Time: 1415-1515 PT Individual Time Calculation (min): 60 min   Short Term Goals: Week 1:  PT Short Term Goal 1 (Week 1): STG=LTG due to ELOS  Skilled Therapeutic Interventions/Progress Updates:   Pt sitting EOB and agreeable to therapy, no c/o pain. Focused on community ambulation first half of session ambulating around hospital and outside w/ supervision. Negotiated 8 steps x2 w/o handrail use w/ and uneven surfaces w/ supervision as well. Discussed easing back into community level ambulation once discharged for safety, both wife and pt verbalized understanding. Returned to unit and instructed and practiced floor transfer x2 reps w/ supervision, pt able to perform safely w/o verbal cues 2nd time. Performed Berg Balance Scale and Dynamic Gait Index as detailed below. Educated wife and pt on significance of results, risk of falls, and areas to improve including gait w/ head turns and standing w/ decreased BOS. Both appreciative and verbalized understanding. Returned to room and ended session sitting EOB and in care of wife, all needs met.   Therapy Documentation Precautions:  Precautions Precautions: Fall Restrictions Weight Bearing Restrictions: No Vital Signs: Therapy Vitals Temp: 98.2 F (36.8 C) Temp Source: Oral Pulse Rate: 75 Resp: 18 BP: 135/62 Patient Position (if appropriate): Sitting Oxygen Therapy SpO2: 97 % O2 Device: Not Delivered Balance: Standardized Balance Assessment Standardized Balance Assessment: Berg Balance Test;Dynamic Gait Index Berg Balance Test Sit to Stand: Able to stand without using hands and stabilize independently Standing Unsupported: Able to stand safely 2 minutes Sitting with Back Unsupported but Feet Supported on Floor or Stool: Able to sit safely and securely 2 minutes Stand to Sit: Sits  safely with minimal use of hands Transfers: Able to transfer safely, minor use of hands Standing Unsupported with Eyes Closed: Able to stand 10 seconds safely Standing Ubsupported with Feet Together: Able to place feet together independently and stand for 1 minute with supervision From Standing, Reach Forward with Outstretched Arm: Can reach confidently >25 cm (10") From Standing Position, Pick up Object from Floor: Able to pick up shoe safely and easily From Standing Position, Turn to Look Behind Over each Shoulder: Looks behind from both sides and weight shifts well Turn 360 Degrees: Able to turn 360 degrees safely but slowly Standing Unsupported, Alternately Place Feet on Step/Stool: Able to stand independently and safely and complete 8 steps in 20 seconds Standing Unsupported, One Foot in Front: Able to plae foot ahead of the other independently and hold 30 seconds Standing on One Leg: Able to lift leg independently and hold equal to or more than 3 seconds Total Score: 50 Dynamic Gait Index Level Surface: Mild Impairment Change in Gait Speed: Mild Impairment Gait with Horizontal Head Turns: Mild Impairment Gait with Vertical Head Turns: Normal Gait and Pivot Turn: Normal Step Over Obstacle: Normal Step Around Obstacles: Normal Steps: Mild Impairment Total Score: 20  See Function Navigator for Current Functional Status.   Therapy/Group: Individual Therapy  Peja Allender K Arnette 10/28/2017, 5:11 PM

## 2017-10-28 NOTE — Progress Notes (Signed)
Physical Therapy Session Note  Patient Details  Name: James Pugh MRN: [id number] Date of Birth: 26-Jul-1946  Today's Date: 10/28/2017 PT Individual Time: 1550-1630 PT Individual Time Calculation (min): 40 min   Short Term Goals: Week 1:  PT Short Term Goal 1 (Week 1): STG=LTG due to ELOS  Skilled Therapeutic Interventions/Progress Updates:    Pt received sitting in [name] and agreeable to PT  Gait training instructed by PT to day room with supervision assist. Min cues to prevent foot slap on paretic LE. 2x 250 each direction.   Wii fit balance board to improve weight shifting in all planes through ankle strategy. penguin slide x 3. Table tilt x 4. Bubble run x 4. Min assist from PT for safety.   Patient returned to room and left sitting in [name] with call bell in reach and all needs met.        Therapy Documentation Precautions:  Precautions Precautions: Fall Restrictions Weight Bearing Restrictions: No Vital Signs: Therapy Vitals Temp: 98.2 F (36.8 C) Temp Source: Oral Pulse Rate: 75 Resp: 18 BP: 135/62 Patient Position (if appropriate): Sitting Oxygen Therapy SpO2: 97 % O2 Device: Not Delivered  Balance: Standardized Balance Assessment Standardized Balance Assessment: Berg Balance Test;Dynamic Gait Index Berg Balance Test Sit to Stand: Able to stand without using hands and stabilize independently Standing Unsupported: Able to stand safely 2 minutes Sitting with Back Unsupported but Feet Supported on Floor or Stool: Able to sit safely and securely 2 minutes Stand to Sit: Sits safely with minimal use of hands Transfers: Able to transfer safely, minor use of hands Standing Unsupported with Eyes Closed: Able to stand 10 seconds safely Standing Ubsupported with Feet Together: Able to place feet together independently and stand for 1 minute with supervision From Standing, Reach Forward with Outstretched Arm: Can reach confidently >25 cm (10") From Standing Position,  Pick up Object from Floor: Able to pick up shoe safely and easily From Standing Position, Turn to Look Behind Over each Shoulder: Looks behind from both sides and weight shifts well Turn 360 Degrees: Able to turn 360 degrees safely but slowly Standing Unsupported, Alternately Place Feet on Step/Stool: Able to stand independently and safely and complete 8 steps in 20 seconds Standing Unsupported, One Foot in Front: Able to plae foot ahead of the other independently and hold 30 seconds Standing on One Leg: Able to lift leg independently and hold equal to or more than 3 seconds Total Score: 50 Dynamic Gait Index Level Surface: Mild Impairment Change in Gait Speed: Mild Impairment Gait with Horizontal Head Turns: Mild Impairment Gait with Vertical Head Turns: Normal Gait and Pivot Turn: Normal Step Over Obstacle: Normal Step Around Obstacles: Normal Steps: Mild Impairment Total Score: 20   See Function Navigator for Current Functional Status.   Therapy/Group: Individual Therapy  Lorie Phenix 10/28/2017, 5:27 PM

## 2017-10-28 NOTE — Progress Notes (Signed)
[facility] PHYSICAL MEDICINE & REHABILITATION     PROGRESS NOTE  Subjective/Complaints:  Pt seen sitting up at the EOB eating breakfast.  Wife at bedside.  Pt states he slept well overnight.  He denies complaints.   ROS: Denies CP, SOB, N/V/D.  Objective: Vital Signs: Blood pressure 137/61, pulse 60, temperature 98.2 F (36.8 C), temperature source Oral, resp. rate 17, height 5\' 6"  (1.676 m), weight 73.4 kg (161 lb 14.4 oz), SpO2 95 %. No results found. No results for input(s): WBC, HGB, HCT, PLT in the last 72 hours. No results for input(s): NA, K, CL, GLUCOSE, BUN, CREATININE, CALCIUM in the last 72 hours.  Invalid input(s): CO CBG (last 3)  Recent Labs    10/27/17 1629 10/27/17 2109 10/28/17 0650  GLUCAP 107* 106* 107*    Wt Readings from Last 3 Encounters:  10/24/17 73.4 kg (161 lb 14.4 oz)  10/22/17 76.2 kg (168 lb)  10/26/16 76.7 kg (169 lb 1.6 oz)    Physical Exam:  BP 137/61 (BP Location: Left Arm)   Pulse 60   Temp 98.2 F (36.8 C) (Oral)   Resp 17   Ht 5\' 6"  (1.676 m)   Wt 73.4 kg (161 lb 14.4 oz)   SpO2 95%   BMI 26.13 kg/m  Constitutional: He appears well-developed and well-nourished.  HENT: Normocephalic and atraumatic.  Eyes: EOM are normal. No discharge.  Cardiovascular: RRR. No JVD. Respiratory: Effort normal and breath sounds normal.  GI: Bowel sounds are normal. Nondistended Musculoskeletal:  No edema or tenderness in extremities  Neurological: He is alert and oriented.  Motor:  LUE: 4/5 proximal to distal with ataxia, improving LLE: 4+/5 HF, KE, 3+/5 ADF/PF Skin: Skin is warm and dry.  Psychiatric: He has a normal mood and affect. His behavior is normal. Thought content normal.   Assessment/Plan: 1. 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 Physiatrist is providing close team supervision and 24  hour management of active medical problems listed below. Physiatrist and rehab team continue to assess barriers to discharge/monitor patient progress toward functional and medical goals.  Function:  Bathing Bathing position   Position: Shower  Bathing parts Body parts bathed by patient: Right arm, Left arm, Chest, Abdomen, Front perineal area, Buttocks, Right upper leg, Left upper leg, Right lower leg, Left lower leg Body parts bathed by helper: Back  Bathing assist Assist Level: Supervision or verbal cues      Upper Body Dressing/Undressing Upper body dressing   What is the patient wearing?: Pull over shirt/dress     Pull over shirt/dress - Perfomed by patient: Thread/unthread right sleeve, Thread/unthread left sleeve, Put head through opening, Pull shirt over trunk          Upper body assist Assist Level: Supervision or verbal cues      Lower Body Dressing/Undressing Lower body dressing   What is the patient wearing?: Underwear, Pants, Non-skid slipper socks, Shoes Underwear - Performed by patient: Thread/unthread right underwear leg, Thread/unthread left underwear leg, Pull underwear up/down   Pants- Performed by patient: Thread/unthread right pants leg, Thread/unthread left pants leg, Pull pants up/down   Non-skid slipper socks- Performed by patient: Don/doff right sock, Don/doff left sock       Shoes - Performed by patient: Don/doff right shoe, Don/doff left shoe, Fasten right, Fasten left  Lower body assist Assist for lower body dressing: Supervision or verbal cues      Toileting Toileting   Toileting steps completed by patient: Adjust clothing prior to toileting, Performs perineal hygiene, Adjust clothing after toileting Toileting steps completed by helper: Adjust clothing after toileting Toileting Assistive Devices: Grab bar or rail  Toileting assist Assist level: Supervision or verbal cues(per Starlyn Skeans, NT)   Transfers Chair/bed transfer    Chair/bed transfer method: Ambulatory Chair/bed transfer assist level: Supervision or verbal cues Chair/bed transfer assistive device: Armrests     Locomotion Ambulation     Max distance: 300' Assist level: Supervision or verbal cues   Wheelchair          Cognition Comprehension Comprehension assist level: Follows complex conversation/direction with extra time/assistive device  Expression Expression assist level: Expresses complex ideas: With extra time/assistive device  Social Interaction Social Interaction assist level: Interacts appropriately 90% of the time - Needs monitoring or encouragement for participation or interaction.  Problem Solving Problem solving assist level: Solves basic 90% of the time/requires cueing < 10% of the time  Memory Memory assist level: Recognizes or recalls 75 - 89% of the time/requires cueing 10 - 24% of the time    Medical Problem List and Plan: 1.  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  DVT Prophylaxis/Anticoagulation: Subcutaneous Lovenox. Monitor for any bleeding episodes 3. Pain Management: Tylenol as needed 4. Mood: Provide emotional support 5. Neuropsych: This patient is capable of making decisions on his own behalf. 6. Skin/Wound Care: Routine skin checks 7. Fluids/Electrolytes/Nutrition: Routine I&O's    BMP within acceptable range on 2/12 8. Hyperlipidemia. Lipitor 9. Diabetes Mellitus type 2. Hemoglobin A1c 6.9.    Relatively controlled on 2/15 10. Recent cataract surgery. Follow-up outpatient 11. Constipation. Laxative assistance 12. HTN   Lisinopril 2.5 started on 2/13   Improved, will consider further increase if necessary 13. Hypoalbuminemia   Supplement initated on 2/12 14. ABLA   Hb 12.6 on 2/11   Labs ordered for Monday    Cont to monitor  LOS (Days) 4 A FACE TO FACE EVALUATION WAS PERFORMED  Ankit Lorie Phenix 10/28/2017 8:51 AM

## 2017-10-28 NOTE — Progress Notes (Signed)
Occupational Therapy Session Note  Patient Details  Name: James Pugh MRN: [id number] Date of Birth: 1945-11-10  Today's Date: 10/28/2017 OT Individual Time: 0569-7948 OT Individual Time Calculation (min): 57 min    Short Term Goals: Week 1:  OT Short Term Goal 1 (Week 1): Pt will perform grooming in standing wtih supervision  OT Short Term Goal 2 (Week 1): Pt will obtain all clothing items from different height surfaces with supervision  OT Short Term Goal 3 (Week 1): Pt will perform toileting with mod I  OT Short Term Goal 4 (Week 1): Pt will perform floor transfers with supervision   Skilled Therapeutic Interventions/Progress Updates:    Pt completed shower, dressing, and grooming to start session.  He was able to complete functional mobility to the shower and stand to complete his shower with supervision.  Dressing sit to stand with standing to donn underpants and pants with supervision,using a wall or UE for support.  He was then able to transfer out to the EOB to complete donning socks and shoes.  He finished ADL session with standing at the sink to blow dry and style his hair.  Next had pt ambulate down to the therapy gym with supervision and no assistive device.  Worked in quadriped initially for weightbearing over the LUE.  Had pt complete shoulder flexion with use of a wall and washcloth in the left hand while standing as well.  Educated pt and spouse on FM coordination exercises and provided handout for reference as well.  Finished session with ambulation back to the room.    Therapy Documentation Precautions:  Precautions Precautions: Fall Restrictions Weight Bearing Restrictions: No   Pain: Pain Assessment Pain Assessment: No/denies pain ADL: See Function Navigator for Current Functional Status.   Therapy/Group: Individual Therapy  [name] OTR/L 10/28/2017, 9:15 AM

## 2017-10-28 NOTE — Progress Notes (Signed)
Physical Therapy Session Note  Patient Details  Name: James Pugh MRN: [id number] Date of Birth: [birth year]  Today's Date: 10/28/2017 PT Individual Time: 1010-1043 PT Individual Time Calculation (min): 33 min   Short Term Goals: Week 1:  PT Short Term Goal 1 (Week 1): STG=LTG due to ELOS  Skilled Therapeutic Interventions/Progress Updates:    Pt sitting in recliner with daughter present upon arrival. Pt agreeable to PT session. Transfers: sit<>stand supervision-mod I. Ambulation performed at multiple distances including 250 ft with no AD. Cues for focus on active dorsiflexion through swing phase and initial contact. Repeated cues and encouragement needed for consistent activation. NRE: working on standing balance and coordination with heel strike to target, adding block for increased hip flexion and balance. Also incorporating sheet assisted df stretch with active hold following stretch. Provided with sheet in room to perform periodically during the day. Caution expressed to not over stretch. Following session, pt in recliner with daughter present and all needs in reach.   Therapy Documentation Precautions:  Precautions Precautions: Fall Restrictions Weight Bearing Restrictions: No    Pain: Denies pain  See Function Navigator for Current Functional Status.   Therapy/Group: Individual Therapy  Linard Millers, PT 10/28/2017, 10:52 AM

## 2017-10-29 ENCOUNTER — Inpatient Hospital Stay (HOSPITAL_COMMUNITY): Payer: Medicare Other | Admitting: Physical Therapy

## 2017-10-29 LAB — GLUCOSE, CAPILLARY
GLUCOSE-CAPILLARY: 117 mg/dL — AB (ref 65–99)
Glucose-Capillary: 99 mg/dL (ref 65–99)
Glucose-Capillary: 94 mg/dL (ref 65–99)
Glucose-Capillary: 96 mg/dL (ref 65–99)

## 2017-10-29 NOTE — Progress Notes (Signed)
[place] PHYSICAL MEDICINE & REHABILITATION     PROGRESS NOTE  Subjective/Complaints:  No new issues. Feeling well. No new complaints.   ROS: pt denies nausea, vomiting, diarrhea, cough, shortness of breath or chest pain   Objective: Vital Signs: Blood pressure 132/62, pulse (!) 55, temperature 98.2 F (36.8 C), temperature source Oral, resp. rate 18, height 5\' 6"  (1.676 m), weight 73.4 kg (161 lb 14.4 oz), SpO2 98 %. No results found. No results for input(s): WBC, HGB, HCT, PLT in the last 72 hours. No results for input(s): NA, K, CL, GLUCOSE, BUN, CREATININE, CALCIUM in the last 72 hours.  Invalid input(s): CO CBG (last 3)  Recent Labs    10/28/17 2105 10/29/17 0626 10/29/17 1136  GLUCAP 106* 99 94    Wt Readings from Last 3 Encounters:  10/24/17 73.4 kg (161 lb 14.4 oz)  10/22/17 76.2 kg (168 lb)  10/26/16 76.7 kg (169 lb 1.6 oz)    Physical Exam:  BP 132/62 (BP Location: Right Arm)   Pulse (!) 55   Temp 98.2 F (36.8 C) (Oral)   Resp 18   Ht 5\' 6"  (1.676 m)   Wt 73.4 kg (161 lb 14.4 oz)   SpO2 98%   BMI 26.13 kg/m  Constitutional: He appears well-developed and well-nourished.  HENT: Normocephalic and atraumatic.  Eyes: EOM are normal. No discharge.  Cardiovascular: RRR without murmur. No JVD  Respiratory: CTA Bilaterally without wheezes or rales. Normal effort   GI: Bowel sounds are normal. Nondistended Musculoskeletal:  No edema or tenderness in extremities  Neurological: He is alert and oriented.  Motor:  LUE: 4/5 proximal to distal with ataxia stable LLE: 4+/5 HF, KE, 3+/5 ADF/PF Skin: Skin is warm and dry.  Psychiatric: pleasant and appropriate  Assessment/Plan: 1. 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 Physiatrist is providing close team supervision and 24 hour management of active medical problems  listed below. Physiatrist and rehab team continue to assess barriers to discharge/monitor patient progress toward functional and medical goals.  Function:  Bathing Bathing position   Position: Shower  Bathing parts Body parts bathed by patient: Right arm, Left arm, Chest, Abdomen, Front perineal area, Buttocks, Right upper leg, Left upper leg, Right lower leg, Left lower leg, Back Body parts bathed by helper: Back  Bathing assist Assist Level: Supervision or verbal cues      Upper Body Dressing/Undressing Upper body dressing   What is the patient wearing?: Pull over shirt/dress     Pull over shirt/dress - Perfomed by patient: Thread/unthread right sleeve, Thread/unthread left sleeve, Put head through opening, Pull shirt over trunk          Upper body assist Assist Level: Supervision or verbal cues      Lower Body Dressing/Undressing Lower body dressing   What is the patient wearing?: Underwear, Pants, Non-skid slipper socks, Shoes Underwear - Performed by patient: Thread/unthread right underwear leg, Thread/unthread left underwear leg, Pull underwear up/down   Pants- Performed by patient: Thread/unthread right pants leg, Thread/unthread left pants leg, Pull pants up/down   Non-skid slipper socks- Performed by patient: Don/doff right sock, Don/doff left sock       Shoes - Performed by patient: Don/doff right shoe, Don/doff left shoe, Fasten right, Fasten left            Lower body assist Assist for lower body dressing: Supervision or  verbal cues      Toileting Toileting   Toileting steps completed by patient: Adjust clothing prior to toileting, Performs perineal hygiene, Adjust clothing after toileting Toileting steps completed by helper: Adjust clothing after toileting Toileting Assistive Devices: Grab bar or rail  Toileting assist Assist level: Supervision or verbal cues   Transfers Chair/bed transfer   Chair/bed transfer method: Ambulatory Chair/bed transfer  assist level: Supervision or verbal cues Chair/bed transfer assistive device: Armrests     Locomotion Ambulation     Max distance: 1000+ ft Assist level: Supervision or verbal cues   Wheelchair          Cognition Comprehension Comprehension assist level: Follows complex conversation/direction with extra time/assistive device  Expression Expression assist level: Expresses complex ideas: With extra time/assistive device  Social Interaction Social Interaction assist level: Interacts appropriately 90% of the time - Needs monitoring or encouragement for participation or interaction.  Problem Solving Problem solving assist level: Solves basic 90% of the time/requires cueing < 10% of the time  Memory Memory assist level: Recognizes or recalls 75 - 89% of the time/requires cueing 10 - 24% of the time    Medical Problem List and Plan: 1.  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  DVT Prophylaxis/Anticoagulation: Subcutaneous Lovenox. Monitor for any bleeding episodes 3. Pain Management: Tylenol as needed 4. Mood: Provide emotional support 5. Neuropsych: This patient is capable of making decisions on his own behalf. 6. Skin/Wound Care: Routine skin checks 7. Fluids/Electrolytes/Nutrition: Routine I&O's    BMP within acceptable range on 2/12 8. Hyperlipidemia. Lipitor 9. Diabetes Mellitus type 2. Hemoglobin A1c 6.9.    Relatively controlled on 2/16 10. Recent cataract surgery. Follow-up outpatient 11. Constipation. Laxative assistance 12. HTN   Lisinopril 2.5 started on 2/13   Improved control. Avoid overtreatment 13. Hypoalbuminemia   Supplement initated on 2/12 14. ABLA   Hb 12.6 on 2/11   Labs ordered for Monday    Cont to monitor  LOS (Days) 5 A FACE TO FACE EVALUATION WAS PERFORMED  SWARTZ,ZACHARY T 10/29/2017 12:04 PM

## 2017-10-29 NOTE — Progress Notes (Signed)
Physical Therapy Discharge Summary  Patient Details  Name: James Pugh MRN: [id number] Date of Birth: March 13, 1946  Today's Date: 10/30/2017 PT Individual Time: 0900-0958, 1430-1526, 56 min  PT Individual Time Calculation (min): 58 min    Patient has met 7 of 7 long term goals due to improved activity tolerance, improved balance, improved postural control, ability to compensate for deficits and improved coordination.  Patient to discharge at an ambulatory level Modified Independent.   Patient's care partner is independent to provide the necessary physical assistance at discharge.  Reasons goals not met: n/a  Recommendation:  Patient will benefit from ongoing skilled PT services in outpatient setting to continue to advance safe functional mobility, address ongoing impairments in balance, quality of gait, endurance, and LLE strength, and minimize fall risk.  Equipment: No equipment provided  Reasons for discharge: treatment goals met and discharge from hospital  Patient/family agrees with progress made and goals achieved: Yes   PT Treatment Interventions: Session 1: Pt seated in recliner upon PT arrival, agreeable to therapy tx and denies pain. Discharge summary completed this session with focus on functional transfers, gait, stair navigation and high level balance. Pt ambulated from room>gym x 150 ft with supervision. Pt participated in berg balance scale for balance testing as detailed below, pt scored 53/56, therapist discussed results with pt. Pt ascended/descended 20 steps with single handrail and supervision, reciprocal pattern. Pt performed car transfer Mod I via ambulation. Pt performed furniture transfers Mod I. Pt performed all short distance ambulation within rehab apartment and his room, Mod I. Pt performed all bed mobility Mod I. Pt worked on dynamic standing balance to perform sidestepping, backward stepping, and narrow/wide base stepping using agility ladder. Wife with  questions about the pt going out in the community upon d/c, therapist recommended supervision for community mobility and limiting ambulation distance as needed based on pt tolerance. Pt ambulated back to room and left in care of wife.   Session 2: Pt seated in recliner upon PT arrival, agreeable to therapy tx and denies pain. Pt ambulated from room>dayroom >200 ft with supervision, no AD. Pt worked on dynamic standing balance without UE support to participate in the following: cybex kinetron stepping in place, cybex kinetron sit<>stands, dynavision while standing on foam x 3 trials, wii bowling, rocker board anterior/posterior and lateral weightshifting, and rockerboard with perturbations. Pt performed lateral step ups on 6 inch step for strengthening and L neuro re-ed, verbal cues for eccentric control with L LE, 2 x 10. Pt ambulated back to room and left seated EOB with needs in reach, wife present.    PT Discharge Precautions/Restrictions Precautions Precautions: Fall Restrictions Weight Bearing Restrictions: No Vital Signs Therapy Vitals Temp: 97.7 F (36.5 C) Temp Source: Oral Pulse Rate: (!) 49 Resp: 18 BP: 128/66 Patient Position (if appropriate): Lying Oxygen Therapy SpO2: 98 % O2 Device: Not Delivered Pain Pain Assessment Pain Assessment: No/denies pain Cognition Overall Cognitive Status: Within Functional Limits for tasks assessed Arousal/Alertness: Awake/alert Orientation Level: Oriented X4 Selective Attention: Appears intact Memory: Appears intact Awareness: Appears intact Problem Solving: Appears intact Sensation Sensation Light Touch: Appears Intact Proprioception: Appears Intact Additional Comments: B LE sensation intact Coordination Gross Motor Movements are Fluid and Coordinated: No Fine Motor Movements are Fluid and Coordinated: No Coordination and Movement Description: mild incoordination L LE and UE Motor  Motor Motor - Discharge Observations: Mild L  hemiparesis  Trunk/Postural Assessment  Cervical Assessment Cervical Assessment: Within Functional Limits Thoracic Assessment Thoracic Assessment: Within Functional Limits  Lumbar Assessment Lumbar Assessment: Within Functional Limits Postural Control Postural Control: Within Functional Limits  Balance Balance Balance Assessed: Yes Standardized Balance Assessment Standardized Balance Assessment: Berg Balance Test;Dynamic Gait Index Berg Balance Test Sit to Stand: Able to stand without using hands and stabilize independently Standing Unsupported: Able to stand safely 2 minutes Sitting with Back Unsupported but Feet Supported on Floor or Stool: Able to sit safely and securely 2 minutes Stand to Sit: Sits safely with minimal use of hands Transfers: Able to transfer safely, minor use of hands Standing Unsupported with Eyes Closed: Able to stand 10 seconds safely Standing Ubsupported with Feet Together: Able to place feet together independently and stand 1 minute safely From Standing, Reach Forward with Outstretched Arm: Can reach confidently >25 cm (10") From Standing Position, Pick up Object from Floor: Able to pick up shoe safely and easily From Standing Position, Turn to Look Behind Over each Shoulder: Looks behind from both sides and weight shifts well Turn 360 Degrees: Able to turn 360 degrees safely in 4 seconds or less Standing Unsupported, Alternately Place Feet on Step/Stool: Able to stand independently and safely and complete 8 steps in 20 seconds Standing Unsupported, One Foot in Front: Able to plae foot ahead of the other independently and hold 30 seconds Standing on One Leg: Able to lift leg independently and hold equal to or more than 3 seconds Total Score: 53 Static Sitting Balance Static Sitting - Level of Assistance: 6: Modified independent (Device/Increase time) Dynamic Sitting Balance Dynamic Sitting - Level of Assistance: 6: Modified independent (Device/Increase  time) Static Standing Balance Static Standing - Level of Assistance: 6: Modified independent (Device/Increase time) Dynamic Standing Balance Dynamic Standing - Level of Assistance: 6: Modified independent (Device/Increase time) Extremity Assessment  RLE Assessment RLE Assessment: Within Functional Limits LLE Strength Left Hip Flexion: 4/5 Left Hip Extension: 4/5 Left Knee Flexion: 4/5 Left Knee Extension: 4/5 Left Ankle Dorsiflexion: 3+/5 Left Ankle Plantar Flexion: 3+/5   See Function Navigator for Current Functional Status.  Netta Corrigan, PT, DPT 10/30/2017, 9:19 AM

## 2017-10-30 ENCOUNTER — Inpatient Hospital Stay (HOSPITAL_COMMUNITY): Payer: Medicare Other

## 2017-10-30 LAB — GLUCOSE, CAPILLARY
Glucose-Capillary: 87 mg/dL (ref 65–99)
Glucose-Capillary: 91 mg/dL (ref 65–99)
Glucose-Capillary: 90 mg/dL (ref 65–99)
Glucose-Capillary: 83 mg/dL (ref 65–99)

## 2017-10-30 NOTE — Progress Notes (Signed)
Occupational Therapy Discharge Summary  Patient Details  Name: James Pugh MRN: [id number] Date of Birth: 04-03-1946  Today's Date: 10/30/2017 OT Individual Time: 1045-1200 OT Individual Time Calculation (min): 75 min   Skilled OT session: 1:1 Pt and wife present at beginning of session reporting completing all ADL tasks at MOD I level standing in shower. Wife present during ADLs in morning and reported not providing any cues to patient.  focus on LUE FMC, balance, endurance, ambulation without AD, scanning and memory. Pt ambulates throughout session with MOD I. Pt stands on compliant foam surface and with staggered stance at high low table to complete push pin activity to improve FMC of LUE with emphasis on rotation/in hand manipulation skills of LUE. Pt sits to complete coin palm<>finger in hand manipulation with VC to decrease compensatory movements of shaking and use of gravity to move coins to fingertips. Pt dropping ~15% of coins. Pt ambulates to gift shop and given 5 items to locate. Pt writes items on list as self identified memory strategy and requires increased time and VC to locate 2/5 items in store. Exited session with pt seated in bed in room and call light in reach with wife present.   Patient has met 9 of 9 long term goals due to improved activity tolerance, improved balance, postural control, ability to compensate for deficits, functional use of  LEFT upper and LEFT lower extremity and improved coordination.  Patient to discharge at overall Modified Independent level.  Patient's care partner is independent to provide the necessary supervision assistance at discharge.    Reasons goals not met: n/a  Recommendation:  Patient will benefit from ongoing skilled OT services in outpatient setting to continue to advance functional skills in the area of iADL L NMR, balance, and memory.  Equipment: No equipment provided  Reasons for discharge: treatment goals met  Patient/family  agrees with progress made and goals achieved: Yes  OT Discharge Precautions/Restrictions  Precautions Precautions: Fall Restrictions Weight Bearing Restrictions: No General   Vital Signs  Pain Pain Assessment Pain Assessment: No/denies pain ADL ADL ADL Comments: see functional navigator Vision Patient Visual Report: No change from baseline Vision Assessment?: No apparent visual deficits Perception  Perception: Within Functional Limits Praxis Praxis: Intact Cognition Overall Cognitive Status: Within Functional Limits for tasks assessed Arousal/Alertness: Awake/alert Orientation Level: Oriented X4 Attention: Selective Selective Attention: Appears intact Memory: Appears intact Awareness: Appears intact Problem Solving: Appears intact Sensation Sensation Light Touch: Appears Intact Hot/Cold: Appears Intact Proprioception: Appears Intact Additional Comments: B LE sensation intact Motor  Motor Motor: Hemiplegia Motor - Discharge Observations: Mild L hemiparesis Mobility  Transfers Transfers: Sit to Stand;Stand to Sit Sit to Stand: 6: Modified independent (Device/Increase time) Stand to Sit: 6: Modified independent (Device/Increase time)  Trunk/Postural Assessment  Cervical Assessment Cervical Assessment: Within Functional Limits Thoracic Assessment Thoracic Assessment: Within Functional Limits Lumbar Assessment Lumbar Assessment: Within Functional Limits Postural Control Postural Control: Within Functional Limits  Balance Balance Balance Assessed: Yes Standardized Balance Assessment Standardized Balance Assessment: Berg Balance Test;Dynamic Gait Index Berg Balance Test Sit to Stand: Able to stand without using hands and stabilize independently Standing Unsupported: Able to stand safely 2 minutes Sitting with Back Unsupported but Feet Supported on Floor or Stool: Able to sit safely and securely 2 minutes Stand to Sit: Sits safely with minimal use of  hands Transfers: Able to transfer safely, minor use of hands Standing Unsupported with Eyes Closed: Able to stand 10 seconds safely Standing Ubsupported with Feet Together: Able  to place feet together independently and stand 1 minute safely From Standing, Reach Forward with Outstretched Arm: Can reach confidently >25 cm (10") From Standing Position, Pick up Object from Floor: Able to pick up shoe safely and easily From Standing Position, Turn to Look Behind Over each Shoulder: Looks behind from both sides and weight shifts well Turn 360 Degrees: Able to turn 360 degrees safely in 4 seconds or less Standing Unsupported, Alternately Place Feet on Step/Stool: Able to stand independently and safely and complete 8 steps in 20 seconds Standing Unsupported, One Foot in Front: Able to plae foot ahead of the other independently and hold 30 seconds Standing on One Leg: Able to lift leg independently and hold equal to or more than 3 seconds Total Score: 53 Static Sitting Balance Static Sitting - Level of Assistance: 6: Modified independent (Device/Increase time) Dynamic Sitting Balance Dynamic Sitting - Level of Assistance: 6: Modified independent (Device/Increase time) Static Standing Balance Static Standing - Level of Assistance: 6: Modified independent (Device/Increase time) Dynamic Standing Balance Dynamic Standing - Level of Assistance: 6: Modified independent (Device/Increase time) Extremity/Trunk Assessment RUE Assessment RUE Assessment: Within Functional Limits LUE Assessment LUE Assessment: Exceptions to WFL(decreased coordination) LUE AROM (degrees) LUE Overall AROM Comments: WFL   See Function Navigator for Current Functional Status.  Lowella Dell Lakrista Scaduto 10/30/2017, 12:11 PM

## 2017-10-30 NOTE — Discharge Summary (Signed)
Discharge summary job # [id number]

## 2017-10-30 NOTE — Progress Notes (Signed)
[place] PHYSICAL MEDICINE & REHABILITATION     PROGRESS NOTE  Subjective/Complaints:  No new complaints.  Feels well.  Excited about going home tomorrow.  Family in for education today  ROS: pt denies nausea, vomiting, diarrhea, cough, shortness of breath or chest pain   .   objective: Vital Signs: Blood pressure 128/66, pulse (!) 49, temperature 97.7 F (36.5 C), temperature source Oral, resp. rate 18, height 5\' 6"  (1.676 m), weight 73.4 kg (161 lb 14.4 oz), SpO2 98 %. No results found. No results for input(s): WBC, HGB, HCT, PLT in the last 72 hours. No results for input(s): NA, K, CL, GLUCOSE, BUN, CREATININE, CALCIUM in the last 72 hours.  Invalid input(s): CO CBG (last 3)  Recent Labs    10/29/17 1636 10/29/17 2054 10/30/17 0641  GLUCAP 96 117* 87    Wt Readings from Last 3 Encounters:  10/24/17 73.4 kg (161 lb 14.4 oz)  10/22/17 76.2 kg (168 lb)  10/26/16 76.7 kg (169 lb 1.6 oz)    Physical Exam:  BP 128/66 (BP Location: Left Arm)   Pulse (!) 49   Temp 97.7 F (36.5 C) (Oral)   Resp 18   Ht 5\' 6"  (1.676 m)   Wt 73.4 kg (161 lb 14.4 oz)   SpO2 98%   BMI 26.13 kg/m  Constitutional: He appears well-developed and well-nourished.  HENT: Normocephalic and atraumatic.  Eyes: EOM are normal. No discharge.  Cardiovascular: RRR without murmur. No JVD   Respiratory: Normal effort GI: Bowel sounds are normal. Nondistended Musculoskeletal:  No edema or tenderness in extremities  Neurological: He is alert and oriented.  Motor:  LUE: 4/5 proximal to distal with ataxia stable LLE: 4+/5 HF, KE, 3+/5 ADF/PF Skin: Skin is warm and dry.  Psychiatric: pleasant and appropriate  Assessment/Plan: 1. 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 Physiatrist is providing close team supervision and 24 hour management of active medical  problems listed below. Physiatrist and rehab team continue to assess barriers to discharge/monitor patient progress toward functional and medical goals.  Function:  Bathing Bathing position   Position: Shower  Bathing parts Body parts bathed by patient: Right arm, Left arm, Chest, Abdomen, Front perineal area, Buttocks, Right upper leg, Left upper leg, Right lower leg, Left lower leg, Back Body parts bathed by helper: Back  Bathing assist Assist Level: Supervision or verbal cues      Upper Body Dressing/Undressing Upper body dressing   What is the patient wearing?: Pull over shirt/dress     Pull over shirt/dress - Perfomed by patient: Thread/unthread right sleeve, Thread/unthread left sleeve, Put head through opening, Pull shirt over trunk          Upper body assist Assist Level: Supervision or verbal cues      Lower Body Dressing/Undressing Lower body dressing   What is the patient wearing?: Underwear, Pants, Non-skid slipper socks, Shoes Underwear - Performed by patient: Thread/unthread right underwear leg, Thread/unthread left underwear leg, Pull underwear up/down   Pants- Performed by patient: Thread/unthread right pants leg, Thread/unthread left pants leg, Pull pants up/down   Non-skid slipper socks- Performed by patient: Don/doff right sock, Don/doff left sock       Shoes - Performed by patient: Don/doff right shoe, Don/doff left shoe, Fasten right, Fasten left            Lower body assist Assist for  lower body dressing: Supervision or verbal cues      Toileting Toileting   Toileting steps completed by patient: Adjust clothing prior to toileting, Performs perineal hygiene, Adjust clothing after toileting Toileting steps completed by helper: Adjust clothing after toileting Toileting Assistive Devices: Grab bar or rail  Toileting assist Assist level: Supervision or verbal cues   Transfers Chair/bed transfer   Chair/bed transfer method: Ambulatory Chair/bed  transfer assist level: Supervision or verbal cues Chair/bed transfer assistive device: Armrests     Locomotion Ambulation     Max distance: 1000+ ft Assist level: Supervision or verbal cues   Wheelchair          Cognition Comprehension Comprehension assist level: Follows complex conversation/direction with extra time/assistive device  Expression Expression assist level: Expresses complex ideas: With extra time/assistive device  Social Interaction Social Interaction assist level: Interacts appropriately 90% of the time - Needs monitoring or encouragement for participation or interaction.  Problem Solving Problem solving assist level: Solves basic 90% of the time/requires cueing < 10% of the time  Memory Memory assist level: Recognizes or recalls 75 - 89% of the time/requires cueing 10 - 24% of the time    Medical Problem List and Plan: 1.  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  DVT Prophylaxis/Anticoagulation: Subcutaneous Lovenox. Monitor for any bleeding episodes 3. Pain Management: Tylenol as needed 4. Mood: Provide emotional support 5. Neuropsych: This patient is capable of making decisions on his own behalf. 6. Skin/Wound Care: Routine skin checks 7. Fluids/Electrolytes/Nutrition: Routine I&O's    BMP within acceptable range on 2/12 8. Hyperlipidemia. Lipitor 9. Diabetes Mellitus type 2. Hemoglobin A1c 6.9.    Relatively controlled on 2/16 10. Recent cataract surgery. Follow-up outpatient 11. Constipation. Laxative assistance 12. HTN   Lisinopril 2.5 started on 2/13   Improved control. Avoid overtreatment 13. Hypoalbuminemia   Supplement initated on 2/12 14. ABLA   Hb 12.6 on 2/11   Labs ordered for Monday    Cont to monitor  LOS (Days) 6 A FACE TO FACE EVALUATION WAS PERFORMED  James Pugh 10/30/2017 8:57 AM

## 2017-10-31 LAB — CREATININE, SERUM
CREATININE: 0.87 mg/dL (ref 0.61–1.24)
GFR calc non Af Amer: >60 mL/min (ref >60)

## 2017-10-31 LAB — GLUCOSE, CAPILLARY: GLUCOSE-CAPILLARY: 91 mg/dL (ref 65–99)

## 2017-10-31 MED ORDER — CLOPIDOGREL BISULFATE 75 MG PO TABS: 75.0000 mg | ORAL_TABLET | Freq: Every day | ORAL | 0 refills | Status: DC

## 2017-10-31 MED ORDER — DOCUSATE SODIUM 100 MG PO CAPS: 100.0000 mg | ORAL_CAPSULE | Freq: Two times a day (BID) | ORAL | 0 refills | Status: DC

## 2017-10-31 MED ORDER — LISINOPRIL 2.5 MG PO TABS: 2.5000 mg | ORAL_TABLET | Freq: Every day | ORAL | 1 refills | Status: DC

## 2017-10-31 MED ORDER — ATORVASTATIN CALCIUM 80 MG PO TABS: 80.0000 mg | ORAL_TABLET | Freq: Every day | ORAL | 0 refills | Status: DC

## 2017-10-31 NOTE — Discharge Summary (Signed)
James Pugh, James Pugh                 ACCOUNT NO.:  [id number]  MEDICAL RECORD NO.:  [id number]  LOCATION:                                 FACILITY:  PHYSICIAN:  Delice Lesch, MD        DATE OF BIRTH:  07/12/1946  DATE OF ADMISSION:  10/24/2017 DATE OF DISCHARGE:  10/31/2017                              DISCHARGE SUMMARY   DISCHARGE DIAGNOSES: 1. 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 2. Subcutaneous Lovenox for deep vein thrombosis prophylaxis. 3. Hyperlipidemia. 4. Diabetes mellitus with peripheral neuropathy. 5. Recent cataract surgery. 6. Constipation. 7. Hypertension. 8. Acute blood loss anemia.  This is a 72 year old right-handed male, history of hyperlipidemia, prediabetic, recent cataract surgery, who lives with spouse, independent prior to admission.  Presented on October 22, 2017, with left-sided weakness and mild dysarthria.  There was report the patient had struck his head 4 days prior on a beam in his crawl space.  There was no loss of consciousness.  Cranial CT scan unremarkable.  The patient did not receive tPA.  MRI showed no acute or focal abnormality.  MRA without significant proximal stenosis, aneurysm, or occlusion.  Echocardiogram with ejection fraction of 60%.  No wall motion abnormalities.  Neurology consulted, suspect acute perforator infarction in the lower right pons. Maintained on aspirin and Plavix for CVA prophylaxis.  Subcutaneous Lovenox for DVT prophylaxis.  Physical and occupational therapy ongoing. The patient was admitted for comprehensive rehab program.  PAST MEDICAL HISTORY:  See discharge diagnoses.  SOCIAL HISTORY:  Lives with spouse, independent prior to admission.  FUNCTIONAL STATUS UPON ADMISSION TO REHAB SERVICES:  Minimal assist 100 feet, minimal guard sit to stand, min to mod assist activities daily living.  PHYSICAL EXAMINATION:  VITAL SIGNS:  Blood pressure 142/69, pulse 58, temperature 98, and respirations  18. GENERAL:  Alert male, in no acute distress. HEENT:  EOMs intact. NECK:  Supple, nontender.  No JVD. CARDIAC:  Rate controlled. ABDOMEN:  Soft, nontender.  Good bowel sounds. LUNGS:  Clear to auscultation without wheeze. EXTREMITIES:  Left upper extremity 4/5 proximal to distal, left lower extremity 4-/5, hip flexors and knee extension 2+/5.  REHABILITATION HOSPITAL COURSE:  The patient was admitted to Inpatient Rehab Services with therapies initiated on a 3-hour daily basis, consisting of physical therapy, occupational therapy, and rehabilitation nursing.  The following issues were addressed during the patient's rehabilitation stay.  Pertaining to James Pugh's acute perforator infarction, remained stable, maintained on aspirin and Plavix therapy. He would follow up with Neurology Services.  Subcutaneous Lovenox for DVT prophylaxis.  No bleeding episodes.  Blood sugars well controlled, hemoglobin A1c of 6.9.  He remained on diet restrictions.  Recent cataract surgery, he would follow up with outpatient Ophthalmology. Blood pressures controlled on low-dose lisinopril.  He continued on Lipitor for hyperlipidemia.  The patient received weekly collaborative interdisciplinary team conferences to discuss estimated length of stay, family teaching, any barriers to discharge.  The patient ambulated with supervision assist, extended distances.  Focused on community ambulation.  Negotiated stairs with supervision.  Discussed easing back into the community level ambulation once discharged.  Dynamic standing balance was good.  He  could gather his belongings for activities of daily living and homemaking, dressing, grooming, and hygiene.  He was able to transfer to the edge of bed to complete, putting on his socks and shoes.  It was discussed no driving.  Family teaching completed and plan discharge to home.  DISCHARGE MEDICATIONS: 1. Aspirin 81 mg p.o. daily. 2. Lipitor 80 mg p.o. daily. 3.  Plavix 75 mg p.o. daily. 4. Colace 100 mg p.o. b.i.d. 5. Lisinopril 2.5 mg p.o. daily. 6. Tylenol as needed.  DIET:  His diet was regular.  He would follow up with Dr. Delice Lesch at the Outpatient Rehab Service office as directed; Dr. Erlinda Hong, Neurology Services, call for appointment; Dr. Carolann Littler on November 08, 2017.     Lauraine Rinne, P.A.   ______________________________ Delice Lesch, MD    DA/MEDQ  D:  10/30/2017  T:  10/30/2017  Job:  [id number]  cc:   Dr. Real Cons, M.D. Delice Lesch, MD

## 2017-10-31 NOTE — Progress Notes (Addendum)
Social Work  Discharge Note  The overall goal for the admission was met for:   Discharge location: Yes-HOME WITH WIFE WHO CAN PROVIDE 24 HR SUPERVISION  Length of Stay: Yes-7 DAYS  Discharge activity level: Yes-MOD/I-SUPERVISION LEVEL  Home/community participation: Yes  Services provided included: MD, RD, PT, OT, SLP, RN, CM, TR, Pharmacy and SW  Financial Services: Medicare and Private Insurance: [place]  Follow-up services arranged: Outpatient: [facility] & OT 2/25 9:15-11:00  Comments (or additional information):PT REACHED GOALS QUICKLY AND WAS READY FOR DC. WIFE HERE FOR EDUCATION DAILY  Patient/Family verbalized understanding of follow-up arrangements: Yes  Individual responsible for coordination of the follow-up plan: SELF & LYNN-WIFE  Confirmed correct DME delivered: Elease Hashimoto 10/31/2017    Elease Hashimoto

## 2017-10-31 NOTE — Progress Notes (Signed)
Pt. And his wife got d/c instructions and prescriptions as well as follow up appoinments.Pt. Is ready to go home .

## 2017-10-31 NOTE — Progress Notes (Signed)
[facility]     PROGRESS NOTE  Subjective/Complaints:  Pt seen sitting up in his chair this AM.  He slept well overnight.  He is ready for discharge.  Wife with questions about follow up appointment, a.fib.   ROS: Denies nausea, vomiting, diarrhea, shortness of breath or chest pain   Objective: Vital Signs: Blood pressure 135/71, pulse (!) 57, temperature 97.9 F (36.6 C), temperature source Oral, resp. rate 18, height 5\' 6"  (1.676 m), weight 73.4 kg (161 lb 14.4 oz), SpO2 98 %. No results found. No results for input(s): WBC, HGB, HCT, PLT in the last 72 hours. Recent Labs    10/31/17 0523  CREATININE 0.87   CBG (last 3)  Recent Labs    10/30/17 1626 10/30/17 2049 10/31/17 0611  GLUCAP 90 83 91    Wt Readings from Last 3 Encounters:  10/24/17 73.4 kg (161 lb 14.4 oz)  10/22/17 76.2 kg (168 lb)  10/26/16 76.7 kg (169 lb 1.6 oz)    Physical Exam:  BP 135/71 (BP Location: Left Arm)   Pulse (!) 57   Temp 97.9 F (36.6 C) (Oral)   Resp 18   Ht 5\' 6"  (1.676 m)   Wt 73.4 kg (161 lb 14.4 oz)   SpO2 98%   BMI 26.13 kg/m  Constitutional: He appears well-developed and well-nourished.  HENT: Normocephalic and atraumatic.  Eyes: EOM are normal. No discharge.  Cardiovascular: RRR. No JVD   Respiratory: Normal effort. Clear. GI: Bowel sounds are normal. Nondistended Musculoskeletal:  No edema or tenderness in extremities  Neurological: He is alert and oriented.  Motor:  LUE: 4+/5 proximal to distal with improving ataxia  LLE: 4+/5 HF, KE, 4-/5 ADF/PF Skin: Skin is warm and dry.  Psychiatric: pleasant and appropriate  Assessment/Plan: 1. 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 Physiatrist is providing close team supervision and 24 hour management of active medical problems listed  below. Physiatrist and rehab team continue to assess barriers to discharge/monitor patient progress toward functional and medical goals.  Function:  Bathing Bathing position   Position: Shower  Bathing parts Body parts bathed by patient: Right arm, Left arm, Chest, Abdomen, Front perineal area, Buttocks, Right upper leg, Left upper leg, Right lower leg, Left lower leg, Back Body parts bathed by helper: Back  Bathing assist Assist Level: More than reasonable time      Upper Body Dressing/Undressing Upper body dressing   What is the patient wearing?: Pull over shirt/dress     Pull over shirt/dress - Perfomed by patient: Thread/unthread right sleeve, Thread/unthread left sleeve, Put head through opening, Pull shirt over trunk          Upper body assist Assist Level: More than reasonable time      Lower Body Dressing/Undressing Lower body dressing   What is the patient wearing?: Underwear, Pants, Non-skid slipper socks, Shoes Underwear - Performed by patient: Thread/unthread right underwear leg, Thread/unthread left underwear leg, Pull underwear up/down   Pants- Performed by patient: Thread/unthread right pants leg, Thread/unthread left pants leg, Pull pants up/down   Non-skid slipper socks- Performed by patient: Don/doff right sock, Don/doff left sock       Shoes - Performed by patient: Don/doff right shoe, Don/doff left shoe, Fasten right, Fasten left            Lower body assist Assist for lower  body dressing: More than reasonable time      Toileting Toileting   Toileting steps completed by patient: Adjust clothing prior to toileting, Performs perineal hygiene, Adjust clothing after toileting Toileting steps completed by helper: Adjust clothing after toileting Toileting Assistive Devices: Grab bar or rail  Toileting assist Assist level: More than reasonable time   Transfers Chair/bed transfer   Chair/bed transfer method: Ambulatory Chair/bed transfer assist  level: No Help, no cues, assistive device, takes more than a reasonable amount of time Chair/bed transfer assistive device: Armrests     Locomotion Ambulation     Max distance: >500 ft Assist level: Supervision or verbal cues   Wheelchair          Cognition Comprehension Comprehension assist level: Follows complex conversation/direction with extra time/assistive device  Expression Expression assist level: Expresses complex ideas: With extra time/assistive device  Social Interaction Social Interaction assist level: Interacts appropriately 90% of the time - Needs monitoring or encouragement for participation or interaction.  Problem Solving Problem solving assist level: Solves basic problems with no assist  Memory Memory assist level: More than reasonable amount of time    Medical Problem List and Plan: 1.  Left side weakness secondary to acute perforator infarct in the lower right pons   D/c today    Will see patient for transitional care management in 1-2 weeks  2.  DVT Prophylaxis/Anticoagulation: Subcutaneous Lovenox. Monitor for any bleeding episodes 3. Pain Management: Tylenol as needed 4. Mood: Provide emotional support 5. Neuropsych: This patient is capable of making decisions on his own behalf. 6. Skin/Wound Care: Routine skin checks 7. Fluids/Electrolytes/Nutrition: Routine I&O's    BMP within acceptable range on 2/12, Cr. WNL on 2/18 8. Hyperlipidemia. Lipitor 9. Diabetes Mellitus type 2. Hemoglobin A1c 6.9.    Relatively controlled on 2/18 10. Recent cataract surgery. Follow-up outpatient 11. Constipation. Laxative assistance 12. HTN   Lisinopril 2.5 started on 2/13   Relatively controlled on 2/18 13. Hypoalbuminemia   Supplement initated on 2/12 14. ABLA   Hb 12.6 on 2/11   Labs pending    Cont to monitor  LOS (Days) 7 A FACE TO FACE EVALUATION WAS PERFORMED  Ardith Lewman Lorie Phenix 10/31/2017 8:51 AM

## 2017-11-01 ENCOUNTER — Telehealth: Payer: Self-pay | Admitting: *Deleted

## 2017-11-01 NOTE — Telephone Encounter (Signed)
Transitional Care call-I spoke with wife Jeani Hawking    1. Are you/is patient experiencing any problems since coming home? Are there any questions regarding any aspect of care? NO 2. Are there any questions regarding medications administration/dosing? Are meds being taken as prescribed? Patient should review meds with caller to confirm  Yes he has all medications 3. Have there been any falls? NO 4. Has Home Health been to the house and/or have they contacted you? If not, have you tried to contact them? Can we help you contact them? NO HH, GOING TO OUTPT NEURO REHAB 5. Are bowels and bladder emptying properly? Are there any unexpected incontinence issues? If applicable, is patient following bowel/bladder programs?NO 6. Any fevers, problems with breathing, unexpected pain?NO  7. Are there any skin problems or new areas of breakdown?NO 8. Has the patient/family member arranged specialty MD follow up (ie cardiology/neurology/renal/surgical/etc)?  Can we help arrange? Appt given to see NP 9. Does the patient need any other services or support that we can help arrange?  NO 10. Are caregivers following through as expected in assisting the patient? YES 11. Has the patient quit smoking, drinking alcohol, or using drugs as recommended? N/A  Appointment Monday 11/14/17 @ 1:00 arrive by 12:40 to see Danella Sensing NP then will follow up with Posey Pronto. [place]

## 2017-11-03 ENCOUNTER — Telehealth: Payer: Self-pay

## 2017-11-03 ENCOUNTER — Institutional Professional Consult (permissible substitution): Payer: Federal, State, Local not specified - PPO | Admitting: Neurology

## 2017-11-03 ENCOUNTER — Ambulatory Visit (INDEPENDENT_AMBULATORY_CARE_PROVIDER_SITE_OTHER): Payer: Medicare Other | Admitting: Adult Health

## 2017-11-03 ENCOUNTER — Encounter: Payer: Self-pay | Admitting: Adult Health

## 2017-11-03 VITALS — BP 122/60 | Temp 98.0°F | Wt 163.0 lb

## 2017-11-03 DIAGNOSIS — J029 Acute pharyngitis, unspecified: Secondary | ICD-10-CM

## 2017-11-03 DIAGNOSIS — I6350 Cerebral infarction due to unspecified occlusion or stenosis of unspecified cerebral artery: Secondary | ICD-10-CM

## 2017-11-03 NOTE — Telephone Encounter (Signed)
Pt did not show for their appt with Dr. Athar today.  

## 2017-11-03 NOTE — Progress Notes (Signed)
Subjective:    Patient ID: James Pugh, male    DOB: Feb 15, 1946, 72 y.o.   MRN: [id number]  HPI  72 year old male  has a past medical history of CERUMEN IMPACTION (08/22/2009), HYPERLIPIDEMIA (06/06/2009), and PREDIABETES (07/13/2010). Recent hospital discharge for right pontine CVA., discharged on 10/24/2017. He presents to the office today for an acute issue of sore throat for less than 24 hours. He denies any fevers, coughing, sinus pain and pressure, chills, or feeling ill.    Review of Systems See HPI   Past Medical History:  Diagnosis Date  . CERUMEN IMPACTION 08/22/2009  . HYPERLIPIDEMIA 06/06/2009  . PREDIABETES 07/13/2010    Social History   Socioeconomic History  . Marital status: Married    Spouse name: Not on file  . Number of children: Not on file  . Years of education: Not on file  . Highest education level: Not on file  Social Needs  . Financial resource strain: Not on file  . Food insecurity - worry: Not on file  . Food insecurity - inability: Not on file  . Transportation needs - medical: Not on file  . Transportation needs - non-medical: Not on file  Occupational History  . Not on file  Tobacco Use  . Smoking status: Former Smoker    Packs/day: 1.00    Years: 20.00    Pack years: 20.00    Types: Cigarettes    Last attempt to quit: 08/11/1983    Years since quitting: 34.2  . Smokeless tobacco: Never Used  Substance and Sexual Activity  . Alcohol use: No  . Drug use: No  . Sexual activity: Not on file  Other Topics Concern  . Not on file  Social History Narrative  . Not on file    Past Surgical History:  Procedure Laterality Date  . TONSILLECTOMY      Family History  Problem Relation Age of Onset  . Stroke Father   . Heart disease Father   . Diabetes Father        type ll  . Alcohol abuse Other     No Known Allergies  Current Outpatient Medications on File Prior to Visit  Medication Sig Dispense Refill  . aspirin 81 MG tablet  Take 81 mg by mouth daily.     Marland Kitchen atorvastatin (LIPITOR) 80 MG tablet Take 1 tablet (80 mg total) by mouth daily at 6 PM. 30 tablet 0  . clopidogrel (PLAVIX) 75 MG tablet Take 1 tablet (75 mg total) by mouth daily. 30 tablet 0  . docusate sodium (COLACE) 100 MG capsule Take 1 capsule (100 mg total) by mouth 2 (two) times daily. 10 capsule 0  . lisinopril (PRINIVIL,ZESTRIL) 2.5 MG tablet Take 1 tablet (2.5 mg total) by mouth daily. 30 tablet 1   No current facility-administered medications on file prior to visit.     BP 122/60 (BP Location: Left Arm)   Temp 98 F (36.7 C) (Oral)   Wt 163 lb (73.9 kg)   BMI 26.31 kg/m       Objective:   Physical Exam  Constitutional: He is oriented to person, place, and time. He appears well-developed and well-nourished. No distress.  HENT:  Right Ear: Hearing, tympanic membrane, external ear and ear canal normal.  Left Ear: Hearing, tympanic membrane, external ear and ear canal normal.  Nose: Mucosal edema and rhinorrhea present. Right sinus exhibits no maxillary sinus tenderness and no frontal sinus tenderness. Left sinus exhibits no maxillary  sinus tenderness and no frontal sinus tenderness.  Mouth/Throat: Uvula is midline and mucous membranes are normal. Oropharyngeal exudate and posterior oropharyngeal erythema (trace ) present.  Cardiovascular: Normal rate, regular rhythm, normal heart sounds and intact distal pulses. Exam reveals no gallop and no friction rub.  No murmur heard. Pulmonary/Chest: Effort normal and breath sounds normal. No respiratory distress. He has no wheezes. He has no rales. He exhibits no tenderness.  Neurological: He is alert and oriented to person, place, and time.  Skin: Skin is warm and dry. No rash noted. He is not diaphoretic. No erythema. No pallor.  Psychiatric: He has a normal mood and affect. His behavior is normal. Judgment and thought content normal.  Nursing note and vitals reviewed.     Assessment & Plan:  1.  Sore throat - appears to be from PND. No signs of sinusitis or strep. Does not have symptoms of flu. Advised flonase to help with symptoms  - warm saltwater gargles  - Follow up as needed  Dorothyann Peng, NP

## 2017-11-03 NOTE — Progress Notes (Signed)
Subjective:    Patient ID: James Pugh, male    DOB: [birth year], 72 y.o.   MRN: [id number]  Sore Throat   Pertinent negatives include no congestion, coughing, ear discharge, ear pain, headaches or shortness of breath.    Recently discharged from the hospital after a stroke. Recovering well from stroke.  Minimal residual deficits per patient and wife.  He started having a sore throat last night but with the recent stroke he was afraid to take anything OTC.  He has been around his daughter who had cold symptoms.  He denies fever or chills, nasal congestion, cough, ear pain/pressure, sinus pain/pressure.  He did feel flush last night.  He took tylenol last night.    Review of Systems  Constitutional: Negative for appetite change, chills, diaphoresis, fatigue and fever.  HENT: Positive for sore throat. Negative for congestion, ear discharge, ear pain, postnasal drip, rhinorrhea, sinus pressure, sinus pain and sneezing.   Respiratory: Negative for cough, chest tightness, shortness of breath and wheezing.   Cardiovascular: Negative.   Gastrointestinal: Negative.   Neurological: Negative for headaches.   Past Medical History:  Diagnosis Date  . CERUMEN IMPACTION 08/22/2009  . HYPERLIPIDEMIA 06/06/2009  . PREDIABETES 07/13/2010    Social History   Socioeconomic History  . Marital status: Married    Spouse name: Not on file  . Number of children: Not on file  . Years of education: Not on file  . Highest education level: Not on file  Social Needs  . Financial resource strain: Not on file  . Food insecurity - worry: Not on file  . Food insecurity - inability: Not on file  . Transportation needs - medical: Not on file  . Transportation needs - non-medical: Not on file  Occupational History  . Not on file  Tobacco Use  . Smoking status: Former Smoker    Packs/day: 1.00    Years: 20.00    Pack years: 20.00    Types: Cigarettes    Last attempt to quit: 08/11/1983    Years since  quitting: 34.2  . Smokeless tobacco: Never Used  Substance and Sexual Activity  . Alcohol use: No  . Drug use: No  . Sexual activity: Not on file  Other Topics Concern  . Not on file  Social History Narrative  . Not on file    Past Surgical History:  Procedure Laterality Date  . TONSILLECTOMY      Family History  Problem Relation Age of Onset  . Stroke Father   . Heart disease Father   . Diabetes Father        type ll  . Alcohol abuse Other     No Known Allergies  Current Outpatient Medications on File Prior to Visit  Medication Sig Dispense Refill  . aspirin 81 MG tablet Take 81 mg by mouth daily.     Marland Kitchen atorvastatin (LIPITOR) 80 MG tablet Take 1 tablet (80 mg total) by mouth daily at 6 PM. 30 tablet 0  . clopidogrel (PLAVIX) 75 MG tablet Take 1 tablet (75 mg total) by mouth daily. 30 tablet 0  . docusate sodium (COLACE) 100 MG capsule Take 1 capsule (100 mg total) by mouth 2 (two) times daily. 10 capsule 0  . lisinopril (PRINIVIL,ZESTRIL) 2.5 MG tablet Take 1 tablet (2.5 mg total) by mouth daily. 30 tablet 1   No current facility-administered medications on file prior to visit.     BP 122/60 (BP Location: Left Arm)  Temp 98 F (36.7 C) (Oral)   Wt 163 lb (73.9 kg)   BMI 26.31 kg/m     Objective:   Physical Exam  Constitutional: He appears well-developed and well-nourished. No distress.  HENT:  Right Ear: Tympanic membrane and ear canal normal.  Left Ear: Tympanic membrane and ear canal normal.  Nose: Mucosal edema and rhinorrhea present. Right sinus exhibits no maxillary sinus tenderness and no frontal sinus tenderness. Left sinus exhibits no maxillary sinus tenderness and no frontal sinus tenderness.  Mouth/Throat: Uvula is midline, oropharynx is clear and moist and mucous membranes are normal. No oropharyngeal exudate, posterior oropharyngeal edema or posterior oropharyngeal erythema.  Inferior turbinates are erythematous Post pharyngeal drainage.     Cardiovascular: Normal rate, regular rhythm and normal heart sounds. Exam reveals no gallop and no friction rub.  No murmur heard. Pulmonary/Chest: Effort normal and breath sounds normal. No respiratory distress. He has no wheezes. He has no rales.  Lymphadenopathy:    He has no cervical adenopathy.  Skin: He is not diaphoretic.  Nursing note and vitals reviewed.     Assessment & Plan:  1. Sore throat Begin Flonase OTC daily.  Warm salt water gargles.  Tylenol for discomfort.   Reiley Bertagnolli C Derion Kreiter BSN RN NP student

## 2017-11-07 ENCOUNTER — Ambulatory Visit: Payer: Medicare Other | Attending: Physical Medicine & Rehabilitation | Admitting: Physical Therapy

## 2017-11-07 ENCOUNTER — Encounter: Payer: Self-pay | Admitting: Physical Therapy

## 2017-11-07 ENCOUNTER — Ambulatory Visit: Payer: Medicare Other | Admitting: Occupational Therapy

## 2017-11-07 ENCOUNTER — Telehealth: Payer: Self-pay | Admitting: Neurology

## 2017-11-07 DIAGNOSIS — R278 Other lack of coordination: Secondary | ICD-10-CM | POA: Diagnosis not present

## 2017-11-07 DIAGNOSIS — R2689 Other abnormalities of gait and mobility: Secondary | ICD-10-CM | POA: Diagnosis not present

## 2017-11-07 NOTE — Telephone Encounter (Signed)
Pt wants call back from nurse regarding aspirin. Conflicting statements from hospital discharge and what Dr. Leonie Man said to take it for 3 weeks -hospital said to continue taking both Plavix and 81 mg aspirin. Best call back is  [phone]

## 2017-11-07 NOTE — Telephone Encounter (Signed)
Pt's wife returned RN's call °

## 2017-11-07 NOTE — Telephone Encounter (Signed)
Rn spoke to wife to let her know Dr. Leonie Man spoke with pt. Rn stated pt is to take aspirin 81mg , plavix 75mg  for three weeks from discharge date. After 3 weeks just take plavix 75mg  alone,and discontinue aspirin. Pts wife stated he only has 30 day of pills. RN stated the PCP can refill the lipitor,and BP meds. Rn stated Dr.Sethi can refill the plavix until the appt in April 2019. The wife stated pt is going to see PCP tomorrow for hospital follow up. Rn advised wife to call back when she needs a refill on the plavix. THe wife verbalized understanding.

## 2017-11-07 NOTE — Telephone Encounter (Signed)
Rn call patient back about the aspirin and plavix question. The pt stated his wife call but they were not at home. Pt was discharge from hospital  Rehab.on 10/31/2017. Rn stated a message will be sent to Dr.SEthi about the aspirin and plavix question. MEssage sent to Dr.Sethi.

## 2017-11-07 NOTE — Telephone Encounter (Signed)
I spoke to the patient and clarified that the plan was to do aspirin 81 and Plavix 75 mg daily for 3 weeks and then to stop the aspirin and stay on Plavix alone. He voiced understanding.

## 2017-11-07 NOTE — Therapy (Signed)
[facility] [address] [place], [place], [address] Phone: [phone]   Fax:  [phone]  Physical Therapy Evaluation  Patient Details  Name: James Pugh MRN: [id number] Date of Birth: 09-15-1945 Referring Provider: Posey Pronto   Encounter Date: 11/07/2017  PT End of Session - 11/07/17 2216    Visit Number  1    Number of Visits  1    PT Start Time  0935    PT Stop Time  1012    PT Time Calculation (min)  37 min    Activity Tolerance  Patient tolerated treatment well    Behavior During Therapy  [facility] for tasks assessed/performed       Past Medical History:  Diagnosis Date  . CERUMEN IMPACTION 08/22/2009  . HYPERLIPIDEMIA 06/06/2009  . PREDIABETES 07/13/2010    Past Surgical History:  Procedure Laterality Date  . TONSILLECTOMY      There were no vitals filed for this visit.   Subjective Assessment - 11/07/17 0939    Subjective  Pt reports having mild CVA on 10/21/17, with wooziness, dizziness, and involuntary movements in L side overnight.  Admitted to hospital on 10/22/17, with CVA with L sided weakness; initially couldn't walk and gradually improved with therapy in inpatient rehab.  Feel like I'm 98% back to normal, except when I get tired.    Patient is accompained by:  Family member    Patient Stated Goals  Pt feels like he is back to most everything-"in my opinion-probably don't need it"    Currently in Pain?  No/denies         [facility] PT Assessment - 11/07/17 0943      Assessment   Medical Diagnosis  CVA    Referring Provider  Patel    Onset Date/Surgical Date  10/21/17    Hand Dominance  Right      Balance Screen   Has the patient fallen in the past 6 months  No    Has the patient had a decrease in activity level because of a fear of falling?   No    Is the patient reluctant to leave their home because of a fear of falling?   No      Home Film/video editor residence     Living Arrangements  Spouse/significant other    Available Help at Discharge  Family    Type of [place] to enter    Entrance Stairs-Number of Steps  2    Entrance Stairs-Rails  Right [place]  One level      Prior Function   Level of Independence  Independent    Leisure  Enjoys working on projects at home, yardwork      Strength   Left Hip Flexion  4+/5    Left Knee Flexion  4+/5    Left Knee Extension  4+/5    Left Ankle Dorsiflexion  4+/5    Left Ankle Plantar Flexion  4+/5      Transfers   Transfers  Sit to Stand;Stand to Sit    Sit to Stand  6: Modified independent (Device/Increase time);Without upper extremity assist;From chair/3-in-1    Five time sit to stand comments   7.65    Stand to Sit  6: Modified independent (Device/Increase time);Without upper extremity assist;To chair/3-in-1      Ambulation/Gait   Ambulation/Gait  Yes  Ambulation/Gait Assistance  7: Independent    Ambulation Distance (Feet)  200 Feet    Assistive device  None    Gait Pattern  Step-through pattern;Wide base of support    Ambulation Surface  Level;Indoor    Gait velocity  8.53 sec = 3.85 ft/sec      Standardized Balance Assessment   Standardized Balance Assessment  Timed Up and Go Test      Timed Up and Go Test   Normal TUG (seconds)  11    Manual TUG (seconds)  10.19    Cognitive TUG (seconds)  10.22      High Level Balance   High Level Balance Comments  Pt able to stand EO and EC 30 seconds on solid surface, able to stand EO and EC 30 seconds foam surface, no LOB.  Upon d/c from hospital, BERG balance score 53/56      Functional Gait  Assessment   Gait assessed   Yes    Gait Level Surface  Walks 20 ft in less than 7 sec but greater than 5.5 sec, uses assistive device, slower speed, mild gait deviations, or deviates 6-10 in outside of the 12 in walkway width.    Change in Gait Speed  Able to smoothly change walking speed without loss of balance or  gait deviation. Deviate no more than 6 in outside of the 12 in walkway width.    Gait with Horizontal Head Turns  Performs head turns smoothly with no change in gait. Deviates no more than 6 in outside 12 in walkway width    Gait with Vertical Head Turns  Performs head turns with no change in gait. Deviates no more than 6 in outside 12 in walkway width.    Gait and Pivot Turn  Pivot turns safely within 3 sec and stops quickly with no loss of balance.    Step Over Obstacle  Is able to step over one shoe box (4.5 in total height) without changing gait speed. No evidence of imbalance.    Gait with Narrow Base of Support  Ambulates less than 4 steps heel to toe or cannot perform without assistance.    Gait with Eyes Closed  Walks 20 ft, uses assistive device, slower speed, mild gait deviations, deviates 6-10 in outside 12 in walkway width. Ambulates 20 ft in less than 9 sec but greater than 7 sec.    Ambulating Backwards  Walks 20 ft, no assistive devices, good speed, no evidence for imbalance, normal gait    Steps  Alternating feet, no rail.    Total Score  24    FGA comment:  Scores <22/3 indicate increased fall risk             Objective measurements completed on examination: See above findings.                           Plan - 11/07/17 2216    Clinical Impression Statement  Pt is a 72 year old male who presents to OP PT s/p CVA 10/22/17, with L sided weakness, which has gradually resolved, to where patient feels he is "98% back to normal".  Pt does not have appreciable strength deficits R vs L on MMT for lower extremties.  Pt's gait velocity is 3.85 ft/sec, unlimited community ambulator range.  Pt does not appear at fall risk per TUG score of 11 sec and FGA score of 24/30.  Pt feels he is back to  near normal and objective measures at eval today do not warrant further PT at this time.  He is concerned about getting full strength back in L hand, and he is scheduled for OT  eval tomorrow.    History and Personal Factors relevant to plan of care:  CVA 10/22/17, PMH DM, OSA    Clinical Presentation  Stable    Clinical Presentation due to:  pt near baseline prior to CVA    Clinical Decision Making  Low    PT Frequency  One time visit    PT Next Visit Plan  Eval only, no further PT warranted at this time.       Patient will benefit from skilled therapeutic intervention in order to improve the following deficits and impairments:     Visit Diagnosis: Other abnormalities of gait and mobility     Problem List Patient Active Problem List   Diagnosis Date Noted  . Acute blood loss anemia   . Hypoalbuminemia due to protein-calorie malnutrition ([place])   . Reactive hypertension   . Right pontine cerebrovascular accident ([place]) 10/24/2017  . Acute CVA (cerebrovascular accident) ([place])   . Dysarthria, post-stroke   . Hyperlipidemia   . Diabetes mellitus type 2 in nonobese (HCC)   . Benign essential HTN   . Status post cataract extraction   . OSA (obstructive sleep apnea)   . CVA (cerebral vascular accident) ([place]) 10/22/2017  . PREDIABETES 07/13/2010  . CERUMEN IMPACTION 08/22/2009  . Dyslipidemia 06/06/2009    Gottlieb Zuercher W. 11/07/2017, 10:21 PM  Frazier Butt., PT   [facility] [address]  [place], [place], [address] Phone: [phone]   Fax:  [phone]  Name: MANUS WEEDMAN MRN: [id number] Date of Birth: [birth year]

## 2017-11-08 ENCOUNTER — Encounter: Payer: Self-pay | Admitting: Neurology

## 2017-11-08 ENCOUNTER — Ambulatory Visit (INDEPENDENT_AMBULATORY_CARE_PROVIDER_SITE_OTHER): Payer: Medicare Other | Admitting: Family Medicine

## 2017-11-08 ENCOUNTER — Encounter: Payer: Self-pay | Admitting: Occupational Therapy

## 2017-11-08 ENCOUNTER — Encounter: Payer: Self-pay | Admitting: Family Medicine

## 2017-11-08 ENCOUNTER — Ambulatory Visit: Payer: Medicare Other | Admitting: Occupational Therapy

## 2017-11-08 VITALS — BP 104/60 | HR 56 | Temp 98.0°F | Wt 159.7 lb

## 2017-11-08 DIAGNOSIS — R05 Cough: Secondary | ICD-10-CM | POA: Diagnosis not present

## 2017-11-08 DIAGNOSIS — I10 Essential (primary) hypertension: Secondary | ICD-10-CM | POA: Diagnosis not present

## 2017-11-08 DIAGNOSIS — E119 Type 2 diabetes mellitus without complications: Secondary | ICD-10-CM | POA: Diagnosis not present

## 2017-11-08 DIAGNOSIS — R278 Other lack of coordination: Secondary | ICD-10-CM | POA: Diagnosis not present

## 2017-11-08 DIAGNOSIS — E785 Hyperlipidemia, unspecified: Secondary | ICD-10-CM | POA: Diagnosis not present

## 2017-11-08 DIAGNOSIS — Z8673 Personal history of transient ischemic attack (TIA), and cerebral infarction without residual deficits: Secondary | ICD-10-CM | POA: Diagnosis not present

## 2017-11-08 DIAGNOSIS — I6302 Cerebral infarction due to thrombosis of basilar artery: Secondary | ICD-10-CM

## 2017-11-08 DIAGNOSIS — R059 Cough, unspecified: Secondary | ICD-10-CM

## 2017-11-08 DIAGNOSIS — R2689 Other abnormalities of gait and mobility: Secondary | ICD-10-CM | POA: Diagnosis not present

## 2017-11-08 LAB — POCT GLYCOSYLATED HEMOGLOBIN (HGB A1C): HEMOGLOBIN A1C: 6.4

## 2017-11-08 MED ORDER — HYDROCODONE-HOMATROPINE 5-1.5 MG/5ML PO SYRP: 5.0000 mL | ORAL_SOLUTION | Freq: Four times a day (QID) | ORAL | 0 refills | Status: AC | PRN

## 2017-11-08 NOTE — Patient Instructions (Signed)
  Coordination Activities  Perform the following activities for 15-20 minutes 1-2 times per day with left hand(s).   Rotate ball in fingertips (clockwise and counter-clockwise).  Toss ball between hands.  Toss ball in air and catch with the same hand.  Flip cards 1 at a time as fast as you can. Focus on speed with this one  Pick up coins and place in container or coin bank.  Start with pennies and then move to dimes. Focus on speed with this one  Pick up coins and stack.  Pick up coins one at a time until you get 5-10 in your hand, then move coins from palm to fingertips to stack one at a time.  Screw together nuts and bolts, then unfasten.

## 2017-11-08 NOTE — Patient Instructions (Signed)
Continue all current medications Be sure to follow up in one month for repeat fasting lipids

## 2017-11-08 NOTE — Progress Notes (Signed)
Subjective:     Patient ID: James Pugh, male   DOB: April 23, 1946, 72 y.o.   MRN: [id number]  HPI Patient seen for hospital follow-up. He was admitted the 9th through the 11th with acute CVA and then one-week stay at rehabilitation. History is that the day prior to admission he developed some left-sided weakness. Discharge summary said he had some dysarthria but he denies having dysarthria initially. He did note some weakness in his left arm and leg along with some numbness and tingling. He went to sleep hoping this would resolve but next morning had difficulty ambulating and continued weakness. He did apparently developed some dysarthria later.   CT angiogram showed no acute findings. MRI showed acute perforator infarct lower right pons region. MR angiogram 50% stenosis left carotid bifurcation. Echocardiogram unremarkable. A1c 6.9%. LDL cholesterol 142.  Patient was discharged on high-dose Lipitor along with low-dose lisinopril 2.5 mgs daily and Plavix 75 mg daily along with aspirin initially. He had rehabilitation stay as above and basically has had almost full recovery regarding his left sided weakness. No dysarthria. No swallowing difficulties.  History of hyperglycemia but not treated with medication. A1c had been 6.1% back in the fall. Poor compliance with diet recently.  Generally feels good at this time. No speech problems. No focal weakness. He has questions regarding whether he can drive.  His only acute concern now is he has had some cough for the past few days. No fever. Cough bothersome especially at night. Requesting cough medication. No relief with over-the-counter medications.  Past Medical History:  Diagnosis Date  . CERUMEN IMPACTION 08/22/2009  . HYPERLIPIDEMIA 06/06/2009  . PREDIABETES 07/13/2010   Past Surgical History:  Procedure Laterality Date  . TONSILLECTOMY      reports that he quit smoking about 34 years ago. His smoking use included cigarettes. He has a 20.00  pack-year smoking history. he has never used smokeless tobacco. He reports that he does not drink alcohol or use drugs. family history includes Alcohol abuse in his other; Diabetes in his father; Heart disease in his father; Stroke in his father. No Known Allergies   Review of Systems  Constitutional: Negative for fatigue.  Eyes: Negative for visual disturbance.  Respiratory: Negative for cough, chest tightness and shortness of breath.   Cardiovascular: Negative for chest pain, palpitations and leg swelling.  Genitourinary: Negative for dysuria.  Neurological: Negative for dizziness, syncope, weakness, light-headedness and headaches.  Psychiatric/Behavioral: Negative for confusion.       Objective:   Physical Exam  Constitutional: He is oriented to person, place, and time. He appears well-developed and well-nourished.  HENT:  Right Ear: External ear normal.  Left Ear: External ear normal.  Mouth/Throat: Oropharynx is clear and moist.  Eyes: Pupils are equal, round, and reactive to light.  Neck: Neck supple. No thyromegaly present.  Cardiovascular: Normal rate and regular rhythm.  Pulmonary/Chest: Effort normal and breath sounds normal. No respiratory distress. He has no wheezes. He has no rales.  Musculoskeletal: He exhibits no edema.  Neurological: He is alert and oriented to person, place, and time. No cranial nerve deficit. Coordination normal.  Gait normal. No focal strength deficits.       Assessment:     #1 acute CVA involving lower right pons region  #2 history of mild hypertension  #3 dyslipidemia with recent LDL cholesterol 142 now on high-dose statin  #4 history of mild hyperglycemia. Repeat A1c today 6.4%  #5 cough. Suspect acute viral bronchitis. No respiratory distress  Plan:     -Order for future labs with lipids in about one month along with hepatic panel -Continue to follow low glycemic diet -Patient questions regarding whether he can drive this  point. He has no cognitive deficits whatsoever and physically he is back to baseline and see no reasons whatsoever he cannot drive at this point. He does have follow-up with rehabilitation and can address further with them as well -Limited Hycodan cough syrup 1 teaspoon daily at bedtime for severe cough -Routine follow-up in 2-3 months -We discussed whether to initiate any diabetes medications. He feels he can improve this with dietary compliance and exercise would like to go that route first  [name] MD [facility] at [facility]

## 2017-11-08 NOTE — Therapy (Signed)
[facility] [address] [place], [place], [address] Phone: [phone]   Fax:  [phone]  Occupational Therapy Evaluation  Patient Details  Name: James Pugh MRN: [id number] Date of Birth: 11-11-1945 Referring Provider: Dr. Delice Lesch   Encounter Date: 11/08/2017  OT End of Session - 11/08/17 0840    Visit Number  1    Number of Visits  1    Authorization Type  medicare    OT Start Time  0802    OT Stop Time  0835    OT Time Calculation (min)  33 min    Activity Tolerance  Patient tolerated treatment well       Past Medical History:  Diagnosis Date  . CERUMEN IMPACTION 08/22/2009  . HYPERLIPIDEMIA 06/06/2009  . PREDIABETES 07/13/2010    Past Surgical History:  Procedure Laterality Date  . TONSILLECTOMY      There were no vitals filed for this visit.  Subjective Assessment - 11/08/17 0803    Patient is accompained by:  Family member wife    Pertinent History  10/21/2017 R pons infarct;  struck hed 4 days prior with no LOC, HLD, prediabetic, recent cataract surgery last fall    Patient Stated Goals  Not sure I don't feel much change - slight loss of strength in my LUE    Currently in Pain?  No/denies        [facility] OT Assessment - 11/08/17 0001      Assessment   Medical Diagnosis  R pons infarct    Referring Provider  Dr. Delice Lesch    Onset Date/Surgical Date  10/21/17    Hand Dominance  Right      Precautions   Precautions  None      Restrictions   Weight Bearing Restrictions  No      Balance Screen   Has the patient fallen in the past 6 months  No PT eval yesterday      Home  Environment   Family/patient expects to be discharged to:  Private residence    Living Arrangements  Spouse/significant other    Available Help at Discharge  Available 24 hours/day    Type of [place]  One level    Bathroom Shower/Tub  [place]  Standard    Additional Comments  Pt states he has no problems getting in and out of shower or on and off toilet.  Pt has no equipment for the bathroom.      Prior Function   Level of Independence  Independent    Vocation  Retired    Leisure  Enjoys working on projects at home, yard work      ADL   Eating/Feeding  Independent    Agricultural engineer - Astronomer -  Control and instrumentation engineer  Independent      IADL   Shopping  Needs to be accompanied on any shopping trip    [place] light daily tasks such as dishwashing, bed making    Meal Prep  Able to complete simple cold meal and snack prep  Community Mobility  Relies on family or friends for transportation    Medication Management  Is responsible for taking medication in correct dosages at correct time    Psychiatrist financial matters independently (budgets, writes checks, pays rent, bills goes to bank), collects and keeps track of income      Mobility   Mobility Status  Independent      Written Expression   Dominant Hand  Right      Vision - History   Baseline Vision  Wears glasses only for reading cataract surgery in the fall    Additional Comments  Pt denies any visual changes      Activity Tolerance   Activity Tolerance  Tolerate 30+ min activity without fatigue      Cognition   Overall Cognitive Status  Within Functional Limits for tasks assessed      Sensation   Light Touch  Appears Intact    Hot/Cold  Appears Intact    Proprioception  Appears Intact      Coordination   Gross Motor Movements are Fluid and Coordinated  Yes    Finger Nose Finger Test  mild impairment    9 Hole Peg Test  Right;Left    Right 9 Hole Peg Test  25.33    Left 9 Hole  Peg Test  29.70      Tone   Assessment Location  Left Upper Extremity      ROM / Strength   AROM / PROM / Strength  AROM;Strength      AROM   Overall AROM   Within functional limits for tasks performed    Overall AROM Comments  BUE's      Strength   Overall Strength  Within functional limits for tasks performed    Overall Strength Comments  BUE's      Hand Function   Right Hand Gross Grasp  Functional    Right Hand Grip (lbs)  85    Left Hand Gross Grasp  Functional    Left Hand Grip (lbs)  80      LUE Tone   LUE Tone  Within Functional Limits                      OT Education - 11/08/17 0837    Education provided  Yes    Education Details  HEP for mild incoordination    Person(s) Educated  Patient;Spouse    Methods  Explanation;Demonstration;Verbal cues;Handout    Comprehension  Verbalized understanding;Returned demonstration          OT Long Term Goals - 11/08/17 0838      OT LONG TERM GOAL #1   Title  n/a            Plan - 11/08/17 1610    Clinical Impression Statement  Pt is a 72 year old male s/p R pons infarct on 10/22/2017.  Pt discharged home on 10/30/2017 after inpt rehab stay.  Pt today presents only with mild incoordination for LUE - HEP provided. Pt has no other OT needs at this time. Pt and wife in agreement.    Occupational Profile and client history currently impacting functional performance  HLD, prediabetic, recent cataract surgery    Occupational performance deficits (Please refer to evaluation for details):  IADL's    OT Frequency  -- n/a    OT Duration  -- n/a    Plan  no further OT needs at this time.  Clinical Decision Making  Limited treatment options, no task modification necessary    Consulted and Agree with Plan of Care  Patient;Family member/caregiver    Family Member Consulted  wife       Patient will benefit from skilled therapeutic intervention in order to improve the following deficits and impairments:   Decreased coordination  Visit Diagnosis: Other lack of coordination    Problem List Patient Active Problem List   Diagnosis Date Noted  . Acute blood loss anemia   . Hypoalbuminemia due to protein-calorie malnutrition ([place])   . Reactive hypertension   . Right pontine cerebrovascular accident ([place]) 10/24/2017  . Acute CVA (cerebrovascular accident) ([place])   . Dysarthria, post-stroke   . Hyperlipidemia   . Diabetes mellitus type 2 in nonobese (HCC)   . Benign essential HTN   . Status post cataract extraction   . OSA (obstructive sleep apnea)   . CVA (cerebral vascular accident) ([place]) 10/22/2017  . PREDIABETES 07/13/2010  . CERUMEN IMPACTION 08/22/2009  . Dyslipidemia 06/06/2009    [facility] 11/08/2017, 8:41 AM  [facility] [address] [place], [place], [address] Phone: [phone]   Fax:  [phone]  Name: James Pugh MRN: [id number] Date of Birth: 03-06-1946

## 2017-11-10 ENCOUNTER — Other Ambulatory Visit: Payer: Self-pay

## 2017-11-10 NOTE — Patient Outreach (Signed)
[facility]) Care Management  11/10/2017  REUBIN BUSHNELL [birth year] [id number]  EMMI- Stroke RED ON EMMI ALERT Day # 9 Date:  11/09/17 Red Alert Reason: Lost interest in things they used to enjoy? Yes  Telephone call to patient for EMMI follow up.  No answer.  HIPAA compliant voice message left.  Plan: RN CM will attempt patient again in one business day.  Jone Baseman, RN, MSN [facility] Care Management Care Management Coordinator Direct Line [phone] Toll Free: [phone]  Fax: [phone]

## 2017-11-11 ENCOUNTER — Other Ambulatory Visit: Payer: Self-pay

## 2017-11-11 NOTE — Patient Outreach (Signed)
[facility]) Care Management  11/11/2017  James Pugh Sep 10, 1946 [id number]   EMMI- Stroke RED ON EMMI ALERT Day # 9 Date: 11/09/17 Red Alert Reason: Lost interest in things they used to enjoy? Yes  Telephone call to patient for EMMI red alert.  Spoke with patient he is able to verify HIPAA.  Discussed with patient red alert.  Patient states that the machine had problems with hearing his responses and kept asking the same questions.  He denies any problems with depression and is doing good. Patient offers no concerns.    Plan: RN CM will send letter and brochure. RN CM will notify care management assistant of case status.  Jone Baseman, RN, MSN [facility] Care Management Care Management Coordinator Direct Line [phone] Toll Free: [phone]  Fax: [phone]

## 2017-11-14 ENCOUNTER — Encounter: Payer: Medicare Other | Attending: Registered Nurse | Admitting: Registered Nurse

## 2017-11-14 ENCOUNTER — Encounter: Payer: Self-pay | Admitting: Registered Nurse

## 2017-11-14 ENCOUNTER — Ambulatory Visit (INDEPENDENT_AMBULATORY_CARE_PROVIDER_SITE_OTHER): Payer: Medicare Other | Admitting: Neurology

## 2017-11-14 ENCOUNTER — Encounter: Payer: Self-pay | Admitting: Neurology

## 2017-11-14 VITALS — BP 144/71 | HR 61 | Ht 66.0 in | Wt 160.0 lb

## 2017-11-14 VITALS — BP 120/61 | HR 60

## 2017-11-14 DIAGNOSIS — Z811 Family history of alcohol abuse and dependence: Secondary | ICD-10-CM | POA: Insufficient documentation

## 2017-11-14 DIAGNOSIS — Z8249 Family history of ischemic heart disease and other diseases of the circulatory system: Secondary | ICD-10-CM | POA: Insufficient documentation

## 2017-11-14 DIAGNOSIS — Z7902 Long term (current) use of antithrombotics/antiplatelets: Secondary | ICD-10-CM | POA: Insufficient documentation

## 2017-11-14 DIAGNOSIS — E785 Hyperlipidemia, unspecified: Secondary | ICD-10-CM | POA: Insufficient documentation

## 2017-11-14 DIAGNOSIS — R0683 Snoring: Secondary | ICD-10-CM | POA: Diagnosis not present

## 2017-11-14 DIAGNOSIS — R7303 Prediabetes: Secondary | ICD-10-CM | POA: Insufficient documentation

## 2017-11-14 DIAGNOSIS — R351 Nocturia: Secondary | ICD-10-CM | POA: Diagnosis not present

## 2017-11-14 DIAGNOSIS — Z823 Family history of stroke: Secondary | ICD-10-CM | POA: Diagnosis not present

## 2017-11-14 DIAGNOSIS — I10 Essential (primary) hypertension: Secondary | ICD-10-CM

## 2017-11-14 DIAGNOSIS — Z87891 Personal history of nicotine dependence: Secondary | ICD-10-CM | POA: Diagnosis not present

## 2017-11-14 DIAGNOSIS — G4719 Other hypersomnia: Secondary | ICD-10-CM

## 2017-11-14 DIAGNOSIS — R0681 Apnea, not elsewhere classified: Secondary | ICD-10-CM

## 2017-11-14 DIAGNOSIS — E663 Overweight: Secondary | ICD-10-CM

## 2017-11-14 DIAGNOSIS — I6350 Cerebral infarction due to unspecified occlusion or stenosis of unspecified cerebral artery: Secondary | ICD-10-CM | POA: Diagnosis not present

## 2017-11-14 DIAGNOSIS — Z9889 Other specified postprocedural states: Secondary | ICD-10-CM | POA: Insufficient documentation

## 2017-11-14 DIAGNOSIS — I639 Cerebral infarction, unspecified: Secondary | ICD-10-CM | POA: Diagnosis not present

## 2017-11-14 DIAGNOSIS — Z833 Family history of diabetes mellitus: Secondary | ICD-10-CM | POA: Insufficient documentation

## 2017-11-14 MED ORDER — CLOPIDOGREL BISULFATE 75 MG PO TABS: 75.0000 mg | ORAL_TABLET | Freq: Every day | ORAL | 0 refills | Status: DC

## 2017-11-14 NOTE — Patient Instructions (Signed)

## 2017-11-14 NOTE — Progress Notes (Signed)
Subjective:    Patient ID: James Pugh, male    DOB: 1946-07-16, 72 y.o.   MRN: [id number]  HPI: [name] is a 72 year old male who is here for transitional care visit in follow up of his acute perforator infarction in the lower right pons and hypertension. Mr.Khatib had physical therapy evaluation on 11/07/2017 and occupational therapy evaluation on 11/08/2017, no services were ordered.  [name] denies any pain. Also states he has a good appetite. He seen his PCP Dr. Harrold Donath on 11/08/2017, he was given permission to resume driving, this was discussed with Dr. Posey Pronto.   Pain Inventory Average Pain 0 Pain Right Now 0 My pain is N/A  In the last 24 hours, has pain interfered with the following? General activity 0 Relation with others 0 Enjoyment of life 0 What TIME of day is your pain at its worst? N/A Sleep (in general) NA  Pain is worse with: NONE Pain improves with: NONE Relief from Meds: 0  Mobility walk without assistance  Function retired  Neuro/Psych No problems in this area  Prior Studies Any changes since last visit?  no  Physicians involved in your care Any changes since last visit?  no   Family History  Problem Relation Age of Onset  . Stroke Father   . Heart disease Father   . Diabetes Father        type ll  . Alcohol abuse Other    Social History   Socioeconomic History  . Marital status: Married    Spouse name: Not on file  . Number of children: Not on file  . Years of education: Not on file  . Highest education level: Not on file  Social Needs  . Financial resource strain: Not on file  . Food insecurity - worry: Not on file  . Food insecurity - inability: Not on file  . Transportation needs - medical: Not on file  . Transportation needs - non-medical: Not on file  Occupational History  . Not on file  Tobacco Use  . Smoking status: Former Smoker    Packs/day: 1.00    Years: 20.00    Pack years: 20.00    Types:  Cigarettes    Last attempt to quit: 08/11/1983    Years since quitting: 34.2  . Smokeless tobacco: Never Used  Substance and Sexual Activity  . Alcohol use: No  . Drug use: No  . Sexual activity: Not on file  Other Topics Concern  . Not on file  Social History Narrative  . Not on file   Past Surgical History:  Procedure Laterality Date  . TONSILLECTOMY     Past Medical History:  Diagnosis Date  . CERUMEN IMPACTION 08/22/2009  . HYPERLIPIDEMIA 06/06/2009  . PREDIABETES 07/13/2010   There were no vitals taken for this visit.  Opioid Risk Score:   Fall Risk Score:  `1  Depression screen PHQ 2/9  Depression screen [facility] 2/9 10/26/2016 11/11/2014 11/11/2014 10/16/2013 10/13/2012  Decreased Interest 0 0 0 0 0  Down, Depressed, Hopeless 0 0 0 0 0  PHQ - 2 Score 0 0 0 0 0     Review of Systems  All other systems reviewed and are negative.      Objective:   Physical Exam  Constitutional: He is oriented to person, place, and time. He appears well-developed and well-nourished.  HENT:  Head: Normocephalic and atraumatic.  Neck: Normal range of motion. Neck supple.  Cardiovascular: Normal rate  and regular rhythm.  Pulmonary/Chest: Effort normal and breath sounds normal.  Musculoskeletal:  Normal Muscle Bulk and Muscle Testing Reveals: Upper Extremities: Full ROM and Muscle Strength 5/5 Lower Extremities: Full ROM and Muscle Strength 5/5 Arises from Table with Ease Narrow Based gait Gait is steady with normal steps  Neurological: He is alert and oriented to person, place, and time.  Skin: Skin is warm and dry.  Psychiatric: He has a normal mood and affect.  Nursing note and vitals reviewed.         Assessment & Plan:  1. Acute CVA: Continue Plavix: Continue  HEP and Follow up with Neurology 2. HTN: Continue Antihypertensive: PCP Following  30 minutes of face to face patient care time was spent during this visit. All questions were encouraged and answered.  F/U with  Dr. Posey Pronto in 4-6 weeks.

## 2017-11-14 NOTE — Progress Notes (Signed)
Subjective:    Patient ID: James Pugh is a 72 y.o. male.  HPI     Star Age, MD, PhD [facility] [address] [place], [place] [address]  Dear Cornelius Moras,   I saw your patient, James Pugh, upon your kind request in my clinic today for initial consultation of his sleep disorder, in particular, concern for underlying obstructive sleep apnea. The patient is accompanied by his wife and daughter today. As you know, James Pugh is a 72 year old right-handed gentleman with an underlying medical history of diabetes, acute stroke earlier this month in the right pons, hyperlipidemia, and overweight state, who reports snoring and excessive daytime somnolence. He has been witnessed to have witnessed breathing pauses while asleep. He has woken up with a sense of shortness of breath. He had extensive stroke workup during his hospitalization from 10/22/2017 through 10/24/2017. He was in an inpatient rehabilitation from 10/24/2017 through 10/31/2017. I reviewed the hospital records. His Epworth sleepiness score is 9 out of 24 today, fatigue score is 12 out of 63.  He sleeps on his sides and back. BT is around 11 PM and WT around 7 AM. He is retired from [employer], Engineer, maintenance (IT). Quit smoking about 40 years, no EtOH, he does not drink caffeine on a regular basis, no FHx of OSA. He had a TE as a child.  He denies AM HAs, has nocturia about 1-2/night on average. He takes a nap in the afternoons sometimes.   His Past Medical History Is Significant For: Past Medical History:  Diagnosis Date  . CERUMEN IMPACTION 08/22/2009  . HYPERLIPIDEMIA 06/06/2009  . PREDIABETES 07/13/2010    His Past Surgical History Is Significant For: Past Surgical History:  Procedure Laterality Date  . TONSILLECTOMY      His Family History Is Significant For: Family History  Problem Relation Age of Onset  . Stroke Father   . Heart disease Father   . Diabetes Father        type  ll  . Alcohol abuse Other     His Social History Is Significant For: Social History   Socioeconomic History  . Marital status: Married    Spouse name: None  . Number of children: None  . Years of education: None  . Highest education level: None  Social Needs  . Financial resource strain: None  . Food insecurity - worry: None  . Food insecurity - inability: None  . Transportation needs - medical: None  . Transportation needs - non-medical: None  Occupational History  . None  Tobacco Use  . Smoking status: Former Smoker    Packs/day: 1.00    Years: 20.00    Pack years: 20.00    Types: Cigarettes    Last attempt to quit: 08/11/1983    Years since quitting: 34.2  . Smokeless tobacco: Never Used  Substance and Sexual Activity  . Alcohol use: No  . Drug use: No  . Sexual activity: None  Other Topics Concern  . None  Social History Narrative  . None    His Allergies Are:  No Known Allergies:   His Current Medications Are:  Outpatient Encounter Medications as of 11/14/2017  Medication Sig  . aspirin 81 MG tablet Take 81 mg by mouth daily.   Marland Kitchen atorvastatin (LIPITOR) 80 MG tablet Take 1 tablet (80 mg total) by mouth daily at 6 PM.  . clopidogrel (PLAVIX) 75 MG tablet Take 1 tablet (75 mg total) by mouth  daily.  Marland Kitchen HYDROcodone-homatropine (HYCODAN) 5-1.5 MG/5ML syrup Take 5 mLs by mouth every 6 (six) hours as needed for up to 10 days.  Marland Kitchen lisinopril (PRINIVIL,ZESTRIL) 2.5 MG tablet Take 1 tablet (2.5 mg total) by mouth daily.  . [DISCONTINUED] docusate sodium (COLACE) 100 MG capsule Take 1 capsule (100 mg total) by mouth 2 (two) times daily.   No facility-administered encounter medications on file as of 11/14/2017.   :  Review of Systems:  Out of a complete 14 point review of systems, all are reviewed and negative with the exception of these symptoms as listed below: Review of Systems  Neurological:       Pt presents today to discuss his sleep. Pt's daughter videoed pt  stopping breathing while he was in the hospital. Pt has never had a sleep study but does endorse snoring.  Epworth Sleepiness Scale 0= would never doze 1= slight chance of dozing 2= moderate chance of dozing 3= high chance of dozing  Sitting and reading: 2 Watching TV: 2 Sitting inactive in a public place (ex. Theater or meeting): 0 As a passenger in a car for an hour without a break: 1 Lying down to rest in the afternoon: 3 Sitting and talking to someone: 0 Sitting quietly after lunch (no alcohol): 1 In a car, while stopped in traffic: 0 Total: 9     Objective:  Neurological Exam  Physical Exam Physical Examination:   Vitals:   11/14/17 0846  BP: (!) 144/71  Pulse: 61    General Examination: The patient is a very pleasant 72 y.o. male in no acute distress. He appears well-developed and well-nourished and well groomed.   HEENT: Normocephalic, atraumatic, pupils are equal, round and reactive to light and accommodation. S/p cataract surgeries. Extraocular tracking is good without limitation to gaze excursion or nystagmus noted. Normal smooth pursuit is noted. Hearing is grossly intact. Face is symmetric with normal facial animation and normal facial sensation. Speech is clear with no dysarthria noted. There is no hypophonia. There is no lip, neck/head, jaw or voice tremor. Neck is supple with full range of passive and active motion. There are no carotid bruits on auscultation. Oropharynx exam reveals: mild mouth dryness, adequate dental hygiene and mild airway crowding, due to redundant soft palate. Mallampati is class II. Tongue protrudes centrally and palate elevates symmetrically. Tonsils are absent. Neck size is 15 7/8 inches. He has a mild overbite. Nasal inspection reveals no significant nasal mucosal bogginess or redness but septal deviation to the left.   Chest: Clear to auscultation without wheezing, rhonchi or crackles noted.  Heart: S1+S2+0, regular and normal without  murmurs, rubs or gallops noted.   Abdomen: Soft, non-tender and non-distended with normal bowel sounds appreciated on auscultation.  Extremities: There is no pitting edema in the distal lower extremities bilaterally. Pedal pulses are intact.  Skin: Warm and dry without trophic changes noted.  Musculoskeletal: exam reveals no obvious joint deformities, tenderness or joint swelling or erythema.   Neurologically:  Mental status: The patient is awake, alert and oriented in all 4 spheres. His immediate and remote memory, attention, language skills and fund of knowledge are appropriate. There is no evidence of aphasia, agnosia, apraxia or anomia. Speech is clear with normal prosody and enunciation. Thought process is linear. Mood is normal and affect is normal.  Cranial nerves II - XII are as described above under HEENT exam. In addition: shoulder shrug is normal with equal shoulder height noted. Motor exam: Normal bulk, and tone  is noted. There is no tremor. Romberg is negative, except for mild sway. Fine motor skills and coordination: grossly intact.  Cerebellar testing: No dysmetria or intention tremor on finger to nose testing. Heel to shin is unremarkable bilaterally. There is no truncal or gait ataxia.  Sensory exam: intact to light touch in the upper and lower extremities.  Gait, station and balance: He stands easily. No veering to one side is noted. No leaning to one side is noted. Posture is age-appropriate and stance is narrow based. Gait shows normal stride length and normal pace. No problems turning are noted. Tandem walk is challenging for him.                Assessment and Plan:  In summary, SHERMAR FRIEDLAND is a very pleasant 72 y.o.-year old male with an underlying medical history of diabetes, acute stroke earlier this month in the right pons, hyperlipidemia, and borderline overweight state, whose history and physical exam are concerning for obstructive sleep apnea (OSA). I had a long  chat with the patient and his wife and daughter about my findings and the diagnosis of OSA, its prognosis and treatment options. We talked about medical treatments, surgical interventions and non-pharmacological approaches. I explained in particular the risks and ramifications of untreated moderate to severe OSA, especially with respect to developing cardiovascular disease down the Road, including congestive heart failure, difficult to treat hypertension, cardiac arrhythmias, or stroke. Even type 2 diabetes has, in part, been linked to untreated OSA. Symptoms of untreated OSA include daytime sleepiness, memory problems, mood irritability and mood disorder such as depression and anxiety, lack of energy, as well as recurrent headaches, especially morning headaches. We talked about trying to maintain a healthy lifestyle in general, as well as the importance of weight control. I encouraged the patient to eat healthy, exercise daily and keep well hydrated, to keep a scheduled bedtime and wake time routine, to not skip any meals and eat healthy snacks in between meals. I advised the patient not to drive when feeling sleepy. I recommended the following at this time: sleep study with potential positive airway pressure titration. (We will score hypopneas at 4%).   I explained the sleep test procedure to the patient and also outlined possible surgical and non-surgical treatment options of OSA, including the use of a custom-made dental device (which would require a referral to a specialist dentist or oral surgeon), upper airway surgical options, such as pillar implants, radiofrequency surgery, tongue base surgery, and UPPP (which would involve a referral to an ENT surgeon). Rarely, jaw surgery such as mandibular advancement may be considered.  I also explained the CPAP treatment option to the patient, who indicated that he would be willing to try CPAP if the need arises. I explained the importance of being compliant with  PAP treatment, not only for insurance purposes but primarily to improve His symptoms, and for the patient's long term health benefit, including to reduce His cardiovascular risks. I answered all their questions today and the patient and his family were in agreement. I would like to see him back after the sleep study is completed and encouraged him to call with any interim questions, concerns, problems or updates.   Thank you very much for allowing me to participate in the care of this nice patient. If I can be of any further assistance to you please do not hesitate to talk to me.   Sincerely,   Star Age, MD, PhD

## 2017-11-23 ENCOUNTER — Other Ambulatory Visit: Payer: Self-pay | Admitting: Family Medicine

## 2017-12-07 ENCOUNTER — Other Ambulatory Visit (INDEPENDENT_AMBULATORY_CARE_PROVIDER_SITE_OTHER): Payer: Medicare Other

## 2017-12-07 DIAGNOSIS — I6302 Cerebral infarction due to thrombosis of basilar artery: Secondary | ICD-10-CM

## 2017-12-07 LAB — HEPATIC FUNCTION PANEL
ALBUMIN: 4 g/dL (ref 3.5–5.2)
ALT: 42 U/L (ref 0–53)
AST: 29 U/L (ref 0–37)
Alkaline Phosphatase: 41 U/L (ref 39–117)
Bilirubin, Direct: 0.1 mg/dL (ref 0.0–0.3)
Total Bilirubin: 0.6 mg/dL (ref 0.2–1.2)
Total Protein: 6.9 g/dL (ref 6.0–8.3)

## 2017-12-07 LAB — LIPID PANEL
CHOLESTEROL: 130 mg/dL (ref 0–200)
HDL: 31.5 mg/dL — ABNORMAL LOW (ref >39.00)
LDL Cholesterol: 80 mg/dL (ref 0–99)
NonHDL: 98.62
TRIGLYCERIDES: 91 mg/dL (ref 0.0–149.0)
Total CHOL/HDL Ratio: 4
VLDL: 18.2 mg/dL (ref 0.0–40.0)

## 2017-12-12 ENCOUNTER — Encounter: Payer: Medicare Other | Admitting: Registered Nurse

## 2017-12-19 ENCOUNTER — Telehealth: Payer: Self-pay | Admitting: Family Medicine

## 2017-12-19 MED ORDER — CLOPIDOGREL BISULFATE 75 MG PO TABS: 75.0000 mg | ORAL_TABLET | Freq: Every day | ORAL | 1 refills | Status: DC

## 2017-12-19 MED ORDER — LISINOPRIL 2.5 MG PO TABS: 2.5000 mg | ORAL_TABLET | Freq: Every day | ORAL | 1 refills | Status: DC

## 2017-12-19 NOTE — Telephone Encounter (Signed)
Patient's wife stated that Dr Elease Hashimoto agreed to refill 3 medications when he was in the office, however 2 medications were not refilled.  This is a time sensitive request as the patient is going out of town on Wednesday.  Patient's wife wants a call back to make sure it can be called in-- [phone]  Lisinopril 2.5 mg Clopidogrel 75 mg  [place]

## 2017-12-19 NOTE — Telephone Encounter (Signed)
Rx sent and wife is aware 

## 2017-12-20 ENCOUNTER — Ambulatory Visit (INDEPENDENT_AMBULATORY_CARE_PROVIDER_SITE_OTHER): Payer: Medicare Other | Admitting: Neurology

## 2017-12-20 DIAGNOSIS — G4720 Circadian rhythm sleep disorder, unspecified type: Secondary | ICD-10-CM

## 2017-12-20 DIAGNOSIS — G4719 Other hypersomnia: Secondary | ICD-10-CM

## 2017-12-20 DIAGNOSIS — I6350 Cerebral infarction due to unspecified occlusion or stenosis of unspecified cerebral artery: Secondary | ICD-10-CM

## 2017-12-20 DIAGNOSIS — E663 Overweight: Secondary | ICD-10-CM

## 2017-12-20 DIAGNOSIS — R0681 Apnea, not elsewhere classified: Secondary | ICD-10-CM

## 2017-12-20 DIAGNOSIS — R0683 Snoring: Secondary | ICD-10-CM

## 2017-12-20 DIAGNOSIS — R351 Nocturia: Secondary | ICD-10-CM

## 2017-12-20 DIAGNOSIS — G4733 Obstructive sleep apnea (adult) (pediatric): Secondary | ICD-10-CM | POA: Diagnosis not present

## 2017-12-21 ENCOUNTER — Ambulatory Visit: Payer: Medicare Other | Admitting: Physical Medicine & Rehabilitation

## 2018-01-02 ENCOUNTER — Telehealth: Payer: Self-pay

## 2018-01-02 NOTE — Procedures (Signed)
4%PATIENT'S NAME:  James Pugh, James Pugh DOB:      [birth year]      MR#:    [id number]     DATE OF RECORDING: 12/20/2017 REFERRING M.D.: Rosalin Hawking, MD,       PCP: Carolann Littler, MD Study Performed:  Split-Night Titration Study HISTORY: 72 year old man with a history of diabetes, right pontine stroke, hyperlipidemia, and overweight state, who reports snoring and excessive daytime somnolence. The patient endorsed the Epworth Sleepiness Scale at 9 points. The patient's weight 161 pounds with a height of 66 (inches), resulting in a BMI of 25.9 kg/m2. The patient's neck circumference measured 15.8 inches.  CURRENT MEDICATIONS: Aspirin, Lipitor, Plavix, Hycodan, Lisinopril   PROCEDURE:  This is a multichannel digital polysomnogram utilizing the Somnostar 11.2 system.  Electrodes and sensors were applied and monitored per AASM Specifications.   EEG, EOG, Chin and Limb EMG, were sampled at 200 Hz.  ECG, Snore and Nasal Pressure, Thermal Airflow, Respiratory Effort, CPAP Flow and Pressure, Oximetry was sampled at 50 Hz. Digital video and audio were recorded.      BASELINE STUDY WITHOUT CPAP RESULTS:  Lights Out was at 22:32 and Lights On at 05:00 for the night, split start at 00:18, Epoch 222.  Total recording time (TRT) was 106, with a total sleep time (TST) of 82 minutes.   The patient's sleep latency was 20.5 minutes.  REM latency was 68 minutes.  The sleep efficiency was 77.4 %.    SLEEP ARCHITECTURE: WASO (Wake after sleep onset) was 10 minutes, Stage N1 was 7.5 minutes, Stage N2 was 26.5 minutes, Stage N3 was 43.5 minutes and Stage R (REM sleep) was 4.5 minutes.  The percentages were Stage N1 9.1%, which is increased, Stage N2 32.3%, which is reduced, Stage N3 53.%, which is increased, and Stage R (REM sleep) 5.5%. The arousals were noted as: 10 were spontaneous, 0 were associated with PLMs, 14 were associated with respiratory events.  Audio and video analysis did not show any abnormal or unusual movements,  behaviors, phonations or vocalizations. The patient took 1 bathroom break for the night. Mild to moderate snoring was noted. The EKG was in keeping with normal sinus rhythm (NSR).   RESPIRATORY ANALYSIS:  There were a total of 66 respiratory events:  13 obstructive apneas, 0 central apneas and 0 mixed apneas with a total of 13 apneas and an apnea index (AI) of 9.5. There were 53 hypopneas with a hypopnea index of 38.8. The patient also had 0 respiratory event related arousals (RERAs).  Snoring was noted.     The total APNEA/HYPOPNEA INDEX (AHI) was 48.3 /hour and the total RESPIRATORY DISTURBANCE INDEX was 48.3 /hour.  6 events occurred in REM sleep and 102 events in NREM. The REM AHI was 80, /hour versus a non-REM AHI of 46.5 /hour. The patient spent 240 minutes sleep time in the supine position 0 minutes in non-supine. The supine AHI was 48.3 /hour versus a non-supine AHI of 0.0 /hour.  OXYGEN SATURATION & C02:  The wake baseline 02 saturation was 96%, with the lowest being 74%. Time spent below 89% saturation equaled 6 minutes.  PERIODIC LIMB MOVEMENTS: The patient had a total of 0 Periodic Limb Movements.  The Periodic Limb Movement (PLM) index was 0 /hour and the PLM Arousal index was 0 /hour.  TITRATION STUDY WITH CPAP RESULTS:   The patient was fitted with a medium Dreamwear mask. CPAP was initiated at 5 cmH20 with heated humidity per AASM split night standards and  pressure was advanced to 12 cmH20 because of hypopneas, apneas and desaturations. He was briefly tried on BiPAP for tolerance, but did not sleep well on BiPAP of 14/10 cm. At a PAP pressure of 12 cmH20, there was a reduction of the AHI to 0/hour with supine NREM sleep achieved and O2 nadir of 94%.   Total recording time (TRT) was 282.5 minutes, with a total sleep time (TST) of 158 minutes. The patient's sleep latency was 17 minutes. REM latency was 48.5 minutes.  The sleep efficiency was 55.9 %. He could not go back to sleep after  03:28.  SLEEP ARCHITECTURE: Wake after sleep was 17.5 minutes, Stage N1 11 minutes, Stage N2 63 minutes, Stage N3 69 minutes and Stage R (REM sleep) 15 minutes. The percentages were: Stage N1 7.%, Stage N2 39.9%, Stage N3 43.7% and Stage R (REM sleep) 9.5%.   The arousals were noted as: 19 were spontaneous, 0 were associated with PLMs, 4 were associated with respiratory events.  RESPIRATORY ANALYSIS:  There were a total of 5 respiratory events: 1 obstructive apneas, 2 central apneas and 0 mixed apneas with a total of 3 apneas and an apnea index (AI) of 1.1. There were 2 hypopneas with a hypopnea index of .8 /hour. The patient also had 0 respiratory event related arousals (RERAs).      The total APNEA/HYPOPNEA INDEX  (AHI) was 1.9 /hour and the total RESPIRATORY DISTURBANCE INDEX was 1.9 /hour.  0 events occurred in REM sleep and 5 events in NREM. The REM AHI was 0 /hour versus a non-REM AHI of 2.1 /hour. REM sleep was achieved on a pressure of  cm/h2o (AHI was  .) The patient spent 100% of total sleep time in the supine position. The supine AHI was 1.9 /hour, versus a non-supine AHI of 0.0/hour.  OXYGEN SATURATION & C02:  The wake baseline 02 saturation was 96%, with the lowest being 87%. Time spent below 89% saturation equaled 0 minutes.  PERIODIC LIMB MOVEMENTS: The patient had a total of 0 Periodic Limb Movements. The Periodic Limb Movement (PLM) index was 0 /hour and the PLM Arousal index was 0 /hour.  Post-study, the patient indicated that sleep was worse than usual.  POLYSOMNOGRAPHY IMPRESSION:   1. Obstructive Sleep Apnea (OSA)  2. Dysfunctions associated with sleep stages or arousals from sleep  RECOMMENDATIONS:  1. This patient has severe obstructive sleep apnea and responded well on CPAP therapy. I will, therefore, start the patient on home CPAP treatment at a pressure of 12 cm via medium FFM, with heated humidity. The patient should be reminded to be fully compliant with PAP therapy  to improve sleep related symptoms and decrease long term cardiovascular risks. Please note that untreated obstructive sleep apnea carries additional perioperative morbidity. Patients with significant obstructive sleep apnea should receive perioperative PAP therapy and the surgeons and particularly the anesthesiologist should be informed of the diagnosis and the severity of the sleep disordered breathing. 2. This study shows sleep fragmentation and abnormal sleep stage percentages; these are nonspecific findings and per se do not signify an intrinsic sleep disorder or a cause for the patient's sleep-related symptoms. Causes include (but are not limited to) the first night effect of the sleep study, circadian rhythm disturbances, medication effect or an underlying mood disorder or medical problem.  3. The patient should be cautioned not to drive, work at heights, or operate dangerous or heavy equipment when tired or sleepy. Review and reiteration of good sleep hygiene measures should  be pursued with any patient. 4. The patient will be seen in follow-up by Dr. Rexene Alberts at [facility] for discussion of the test results and further management strategies. The referring provider will be notified of the test results.  I certify that I have reviewed the entire raw data recording prior to the issuance of this report in accordance with the Standards of Accreditation of the American Academy of Sleep Medicine (AASM)   Star Age, MD, PhD Diplomat, American Board of Psychiatry and Neurology (Neurology and Sleep Medicine)

## 2018-01-02 NOTE — Telephone Encounter (Signed)
-----   Message from Star Age, MD sent at 01/02/2018  2:32 PM EDT ----- Patient referred by Dr. Erlinda Hong, seen by me on 11/14/17, split night sleep study on 12/20/17. Please call and notify patient that the recent sleep study confirmed the diagnosis of severe OSA. He did well with CPAP during the study with significant improvement of the respiratory events. Therefore, I would like start the patient on CPAP therapy at home by prescribing a machine for home use. I placed the order in the chart.  Please advise patient that we need a follow up appointment with either myself or one of our nurse practitioners in about 10 weeks post set-up to check for how the patient is feeling and how well the patient is using the machine, etc. Please go ahead and schedule the appointment, while you have the patient on the phone and make sure patient understands the importance of keeping this window for the FU appointment, as it is often an insurance requirement. Failing to adhere to this may result in losing coverage for sleep apnea treatment, at which point most patients are left with a choice of returning the machine or paying out of pocket (and we want neither of this to happen!).  Please re-enforce the importance of compliance with treatment and the need for [place] to monitor compliance data - again an insurance requirement and usually a good feedback for the patient as far as how they are doing.  Also remind patient, that any PAP machine or mask issues should be first addressed with the DME company, who provided the machine/mask.  Please ask if patient has a preference regarding DME company, may depend on the insurance too.  Please arrange for CPAP set up at home through a DME company of patient's choice.  Once you have spoken to the patient you can close the phone encounter. Please fax/route report to referring provider, thanks,   Star Age, MD, PhD [facility])

## 2018-01-02 NOTE — Telephone Encounter (Signed)
I called pt. I advised pt that Dr. Rexene Alberts reviewed their sleep study results and found that pt has severe osa. Dr. Rexene Alberts recommends that pt start a cpap at home. I reviewed PAP compliance expectations with the pt. Pt is agreeable to starting a CPAP. I advised pt that an order will be sent to a DME, [facility], and [facility] will call the pt within about one week after they file with the pt's insurance. [facility] will show the pt how to use the machine, fit for masks, and troubleshoot the CPAP if needed. A follow up appt was made for insurance purposes with Dr. Rexene Alberts on 03/13/18 at 8:30am. Pt verbalized understanding to arrive 15 minutes early and bring their CPAP. A letter with all of this information in it will be sent to the pt's mychart as a reminder. I verified with the pt that the address we have on file is correct. Pt verbalized understanding of results. Pt had no questions at this time but was encouraged to call back if questions arise.

## 2018-01-02 NOTE — Progress Notes (Signed)
Patient referred by Dr. Erlinda Hong, seen by me on 11/14/17, split night sleep study on 12/20/17. Please call and notify patient that the recent sleep study confirmed the diagnosis of severe OSA. He did well with CPAP during the study with significant improvement of the respiratory events. Therefore, I would like start the patient on CPAP therapy at home by prescribing a machine for home use. I placed the order in the chart.  Please advise patient that we need a follow up appointment with either myself or one of our nurse practitioners in about 10 weeks post set-up to check for how the patient is feeling and how well the patient is using the machine, etc. Please go ahead and schedule the appointment, while you have the patient on the phone and make sure patient understands the importance of keeping this window for the FU appointment, as it is often an insurance requirement. Failing to adhere to this may result in losing coverage for sleep apnea treatment, at which point most patients are left with a choice of returning the machine or paying out of pocket (and we want neither of this to happen!).  Please re-enforce the importance of compliance with treatment and the need for [place] to monitor compliance data - again an insurance requirement and usually a good feedback for the patient as far as how they are doing.  Also remind patient, that any PAP machine or mask issues should be first addressed with the DME company, who provided the machine/mask.  Please ask if patient has a preference regarding DME company, may depend on the insurance too.  Please arrange for CPAP set up at home through a DME company of patient's choice.  Once you have spoken to the patient you can close the phone encounter. Please fax/route report to referring provider, thanks,   Star Age, MD, PhD [facility])

## 2018-01-02 NOTE — Addendum Note (Signed)
Addended by: Star Age on: 01/02/2018 02:32 PM   Modules accepted: Orders

## 2018-01-03 ENCOUNTER — Encounter: Payer: Self-pay | Admitting: Neurology

## 2018-01-03 ENCOUNTER — Ambulatory Visit (INDEPENDENT_AMBULATORY_CARE_PROVIDER_SITE_OTHER): Payer: Medicare Other | Admitting: Neurology

## 2018-01-03 VITALS — BP 122/61 | HR 57 | Ht 66.0 in | Wt 155.0 lb

## 2018-01-03 DIAGNOSIS — I6381 Other cerebral infarction due to occlusion or stenosis of small artery: Secondary | ICD-10-CM

## 2018-01-03 NOTE — Progress Notes (Signed)
[facility] [address] [place]. [place] [address] [phone]       OFFICE FOLLOW-UP NOTE  James. James Pugh Date of Birth:  01/30/1946 Medical Record Number:  [id number]   HPI: James Pugh is a 72 year old Caucasian male seen today for first office follow-up visit following hospital admission for stroke in February 2019. History is obtained from the patient and his wife and review of electronic medical records. I have personally reviewed imaging films.James Pugh an 72 y.o.malewithout any significant past medical history presented to the ED with persistent left upper and lower extremity weakness, numbness tingling and ataxic gait which began the night before at 9 PM. Patient states last night around 9 PM he noted intermittent jerking of his left arm it would last for 30 seconds and continued until almost 4 AM on 10/22/17. Also that time, he noted he was leaning more to his left side with left upper extremity and left lower extremity weakness-feelings of loss of sensation, numbness and tingling in the left upper and left lower extremity and left face as well as unsteady gait. He noted his speech had been a little bit slurred but he went to bed and woke up this morning without any improvement in his symptoms. He called his daughter who was able to confirm that patient did have slurred speech with word finding difficulties. Admits to feeling off balanced yesterday through to today, denies associated headache, dizziness, visual changes-he had recent cataract surgery, nausea or vomiting. Patient denied chest pain, shortness of breath recent fevers, chills or illnesses but admits to being under immense stress. Patient was brought to the ER by his daughter, who describes patient with unsteady gait as he walked into the ER. Initial Noncon CT of the head did not show any acute abnormalities. Due to patient presenting with posterior ischemic stroke symptoms, neuro was  consulted. She denies a personal history of stroke but admits to history of stroke in his brother, as well as over 38-year 1 pack/day smoking history. Of note about 4 days ago, patient hit his head on a beam in his crawl space. He said he hit the top of his head, without any resultant loss of consciousness but he did see stars and denied any confusion. Patient's vitals and labs have remained stable while in the ER. Last seen normal 10/21/17 at 9 pm. TPA given no as out of time window.CT scan of the head showed no acute abnormality. MRI scan of the brain showed right lower pontine lacunar infarct. 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 2-D echo showed normal ejection fraction without cardiac source of embolism.  LDL cholesterol 142 mg percent and hemoglobin A1c 6.9. Patient was started on dual antiplatelet therapy of aspirin and Plavix. He subsequently had outpatient polysomnogram study done which showed severe obstructive sleep apnea with apnea-hypopnea index of 48.3/h.he has seen Dr. Arnell Pugh and plans on starting CPAP soon. Patient states his done well since discharge. His mid full recovery.No residual deficits. His tolerating aspirin and Plavix well without bleeding but does bruise easily. He is tolerating Lipitor well without muscle aches and pains. He had follow-up lipid profile checked last month and LDL was improved but still 80 mg percent. Patient did try out for the PREMIERS stroke prevention trial but was a screen failure.    ROS:   14 system review of systems is positive for  Sleep apnea and all other systems negative PMH:  Past Medical History:  Diagnosis Date  . CERUMEN IMPACTION 08/22/2009  . HYPERLIPIDEMIA 06/06/2009  . PREDIABETES 07/13/2010  . Stroke [facility])     Social History:  Social History   Socioeconomic History  . Marital status: Married    Spouse name: Not on file  . Number of children: Not on file  . Years of education: Not on file  .  Highest education level: Not on file  Occupational History  . Not on file  Social Needs  . Financial resource strain: Not on file  . Food insecurity:    Worry: Not on file    Inability: Not on file  . Transportation needs:    Medical: Not on file    Non-medical: Not on file  Tobacco Use  . Smoking status: Former Smoker    Packs/day: 1.00    Years: 20.00    Pack years: 20.00    Types: Cigarettes    Last attempt to quit: 08/11/1983    Years since quitting: 34.4  . Smokeless tobacco: Never Used  Substance and Sexual Activity  . Alcohol use: No  . Drug use: No  . Sexual activity: Not on file  Lifestyle  . Physical activity:    Days per week: Not on file    Minutes per session: Not on file  . Stress: Not on file  Relationships  . Social connections:    Talks on phone: Not on file    Gets together: Not on file    Attends religious service: Not on file    Active member of club or organization: Not on file    Attends meetings of clubs or organizations: Not on file    Relationship status: Not on file  . Intimate partner violence:    Fear of current or ex partner: Not on file    Emotionally abused: Not on file    Physically abused: Not on file    Forced sexual activity: Not on file  Other Topics Concern  . Not on file  Social History Narrative  . Not on file    Medications:   Current Outpatient Medications on File Prior to Visit  Medication Sig Dispense Refill  . atorvastatin (LIPITOR) 80 MG tablet TAKE 1 TABLET BY MOUTH EVERY DAY AT 6PM 90 tablet 1  . clopidogrel (PLAVIX) 75 MG tablet Take 1 tablet (75 mg total) by mouth daily. 90 tablet 1  . lisinopril (PRINIVIL,ZESTRIL) 2.5 MG tablet Take 1 tablet (2.5 mg total) by mouth daily. 90 tablet 1   No current facility-administered medications on file prior to visit.     Allergies:  No Known Allergies  Physical Exam General: well developed, well nourished middle-age Caucasian male, seated, in no evident distress Head:  head normocephalic and atraumatic.  Neck: supple with no carotid or supraclavicular bruits Cardiovascular: regular rate and rhythm, no murmurs Musculoskeletal: no deformity Skin:  no rash/petichiae Vascular:  Normal pulses all extremities Vitals:   01/03/18 1035  BP: 122/61  Pulse: (!) 57   Neurologic Exam Mental Status: Awake and fully alert. Oriented to place and time. Recent and remote memory intact. Attention span, concentration and fund of knowledge appropriate. Mood and affect appropriate.  Cranial Nerves: Fundoscopic exam reveals sharp disc margins. Pupils equal, briskly reactive to light. Extraocular movements full without nystagmus. Visual fields full to confrontation. Hearing intact. Facial sensation intact. Face, tongue, palate moves normally and symmetrically.  Motor: Normal bulk and tone. Normal strength in all tested extremity muscles. Sensory.: intact to touch ,pinprick .position and  vibratory sensation.  Coordination: Rapid alternating movements normal in all extremities. Finger-to-nose and heel-to-shin performed accurately bilaterally. Gait and Station: Arises from chair without difficulty. Stance is normal. Gait demonstrates normal stride length and balance . Able to heel, toe and tandem walk without difficulty.  Reflexes: 1+ and symmetric. Toes downgoing.   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    PLAN: I had a long d/w patient about his recent lacunar stroke,ostructive sleep apnoea, risk for recurrent stroke/TIAs, personally independently reviewed imaging studies and stroke evaluation results and answered questions.Continue Plavix for secondary stroke prevention and maintain strict control of hypertension with blood pressure goal below 130/90, diabetes with hemoglobin A1c goal below 6.5% and  lipids with LDL cholesterol goal below 70 mg/dL. I also advised the patient to eat a healthy diet with plenty of whole grains, cereals, fruits and vegetables, exercise regularly and maintain ideal body weight he was advised to use his CPAP for for his diagnosis of obstructive sleep apnea every night.Followup in the future with my practitioner Janett Billow in 6 months and with Dr. Rexene Alberts for his sleep apnea Greater than 50% of time during this 25 minute visit was spent on counseling,explanation of diagnosis of lacunar infarct and sleep apnea, planning of further management, discussion with patient and family and coordination of care Antony Contras, MD  [facility] [address] [place], [place] [address]  Phone [phone] Fax [phone] Note: This document was prepared with digital dictation and possible smart phrase technology. Any transcriptional errors that result from this process are unintentional

## 2018-01-03 NOTE — Patient Instructions (Signed)
I had a long d/w patient about his recent lacunar stroke,ostructive sleep apnoea, risk for recurrent stroke/TIAs, personally independently reviewed imaging studies and stroke evaluation results and answered questions.Continue Plavix for secondary stroke prevention and maintain strict control of hypertension with blood pressure goal below 130/90, diabetes with hemoglobin A1c goal below 6.5% and lipids with LDL cholesterol goal below 70 mg/dL. I also advised the patient to eat a healthy diet with plenty of whole grains, cereals, fruits and vegetables, exercise regularly and maintain ideal body weight he was advised to use his CPAP for for his diagnosis of obstructive sleep apnea every night.Followup in the future with my practitioner Janett Billow in 6 months and with Dr. Rexene Alberts for his sleep apnea   Stroke Prevention Some medical conditions and behaviors are associated with a higher chance of having a stroke. You can help prevent a stroke by making nutrition, lifestyle, and other changes, including managing any medical conditions you may have. What nutrition changes can be made?  Eat healthy foods. You can do this by: ? Choosing foods high in fiber, such as fresh fruits and vegetables and whole grains. ? Eating at least 5 or more servings of fruits and vegetables a day. Try to fill half of your plate at each meal with fruits and vegetables. ? Choosing lean protein foods, such as lean cuts of meat, poultry without skin, fish, tofu, beans, and nuts. ? Eating low-fat dairy products. ? Avoiding foods that are high in salt (sodium). This can help lower blood pressure. ? Avoiding foods that have saturated fat, trans fat, and cholesterol. This can help prevent high cholesterol. ? Avoiding processed and premade foods.  Follow your health care provider's specific guidelines for losing weight, controlling high blood pressure (hypertension), lowering high cholesterol, and managing diabetes. These may  include: ? Reducing your daily calorie intake. ? Limiting your daily sodium intake to 1,500 milligrams (mg). ? Using only healthy fats for cooking, such as olive oil, canola oil, or sunflower oil. ? Counting your daily carbohydrate intake. What lifestyle changes can be made?  Maintain a healthy weight. Talk to your health care provider about your ideal weight.  Get at least 30 minutes of moderate physical activity at least 5 days a week. Moderate activity includes brisk walking, biking, and swimming.  Do not use any products that contain nicotine or tobacco, such as cigarettes and e-cigarettes. If you need help quitting, ask your health care provider. It may also be helpful to avoid exposure to secondhand smoke.  Limit alcohol intake to no more than 1 drink a day for nonpregnant women and 2 drinks a day for men. One drink equals 12 oz of beer, 5 oz of wine, or 1 oz of hard liquor.  Stop any illegal drug use.  Avoid taking birth control pills. Talk to your health care provider about the risks of taking birth control pills if: ? You are over 52 years old. ? You smoke. ? You get migraines. ? You have ever had a blood clot. What other changes can be made?  Manage your cholesterol levels. ? Eating a healthy diet is important for preventing high cholesterol. If cholesterol cannot be managed through diet alone, you may also need to take medicines. ? Take any prescribed medicines to control your cholesterol as told by your health care provider.  Manage your diabetes. ? Eating a healthy diet and exercising regularly are important parts of managing your blood sugar. If your blood sugar cannot be managed through diet and  exercise, you may need to take medicines. ? Take any prescribed medicines to control your diabetes as told by your health care provider.  Control your hypertension. ? To reduce your risk of stroke, try to keep your blood pressure below 130/80. ? Eating a healthy diet and  exercising regularly are an important part of controlling your blood pressure. If your blood pressure cannot be managed through diet and exercise, you may need to take medicines. ? Take any prescribed medicines to control hypertension as told by your health care provider. ? Ask your health care provider if you should monitor your blood pressure at home. ? Have your blood pressure checked every year, even if your blood pressure is normal. Blood pressure increases with age and some medical conditions.  Get evaluated for sleep disorders (sleep apnea). Talk to your health care provider about getting a sleep evaluation if you snore a lot or have excessive sleepiness.  Take over-the-counter and prescription medicines only as told by your health care provider. Aspirin or blood thinners (antiplatelets or anticoagulants) may be recommended to reduce your risk of forming blood clots that can lead to stroke.  Make sure that any other medical conditions you have, such as atrial fibrillation or atherosclerosis, are managed. What are the warning signs of a stroke? The warning signs of a stroke can be easily remembered as BEFAST.  B is for balance. Signs include: ? Dizziness. ? Loss of balance or coordination. ? Sudden trouble walking.  E is for eyes. Signs include: ? A sudden change in vision. ? Trouble seeing.  F is for face. Signs include: ? Sudden weakness or numbness of the face. ? The face or eyelid drooping to one side.  A is for arms. Signs include: ? Sudden weakness or numbness of the arm, usually on one side of the body.  S is for speech. Signs include: ? Trouble speaking (aphasia). ? Trouble understanding.  T is for time. ? These symptoms may represent a serious problem that is an emergency. Do not wait to see if the symptoms will go away. Get medical help right away. Call your local emergency services (911 in the [place] Do not drive yourself to the hospital.  Other signs of stroke  may include: ? A sudden, severe headache with no known cause. ? Nausea or vomiting. ? Seizure.  Where to find more information: For more information, visit:  American Stroke Association: www.strokeassociation.org  National Stroke Association: www.stroke.org  Summary  You can prevent a stroke by eating healthy, exercising, not smoking, limiting alcohol intake, and managing any medical conditions you may have.  Do not use any products that contain nicotine or tobacco, such as cigarettes and e-cigarettes. If you need help quitting, ask your health care provider. It may also be helpful to avoid exposure to secondhand smoke.  Remember BEFAST for warning signs of stroke. Get help right away if you or a loved one has any of these signs. This information is not intended to replace advice given to you by your health care provider. Make sure you discuss any questions you have with your health care provider. Document Released: 10/07/2004 Document Revised: 10/05/2016 Document Reviewed: 10/05/2016 Elsevier Interactive Patient Education  [employer].

## 2018-01-04 ENCOUNTER — Ambulatory Visit (INDEPENDENT_AMBULATORY_CARE_PROVIDER_SITE_OTHER): Payer: Medicare Other | Admitting: Family Medicine

## 2018-01-04 VITALS — BP 114/62 | HR 62 | Temp 97.7°F | Ht 66.0 in | Wt 153.8 lb

## 2018-01-04 DIAGNOSIS — E785 Hyperlipidemia, unspecified: Secondary | ICD-10-CM | POA: Diagnosis not present

## 2018-01-04 DIAGNOSIS — I6302 Cerebral infarction due to thrombosis of basilar artery: Secondary | ICD-10-CM | POA: Diagnosis not present

## 2018-01-04 DIAGNOSIS — I6381 Other cerebral infarction due to occlusion or stenosis of small artery: Secondary | ICD-10-CM | POA: Diagnosis not present

## 2018-01-04 DIAGNOSIS — E119 Type 2 diabetes mellitus without complications: Secondary | ICD-10-CM | POA: Diagnosis not present

## 2018-01-04 DIAGNOSIS — I10 Essential (primary) hypertension: Secondary | ICD-10-CM

## 2018-01-04 NOTE — Progress Notes (Signed)
Subjective:     Patient ID: James Pugh, male   DOB: 10-13-1945, 72 y.o.   MRN: [id number]  HPI Patient seen for medical follow-up. He had history of lacunar stroke recently and has been followed by neurology for that. He has hypertension, dyslipidemia, type 2 diabetes. Recent A1c 6.4%. LDL cholesterol 80.  His blood pressure has been well controlled. His current medications include Plavix, Lipitor 80 mg daily, and lisinopril 2.5 mg daily.   He has made significant dietary changes. His wife is concerned that he is losing too much weight. His appetite is good. He had no further neurologic symptoms. He is staying very active. He's had sleep study and has sleep apnea and they're waiting on further guidance regarding CPAP  Past Medical History:  Diagnosis Date  . CERUMEN IMPACTION 08/22/2009  . HYPERLIPIDEMIA 06/06/2009  . PREDIABETES 07/13/2010  . Stroke [facility])    Past Surgical History:  Procedure Laterality Date  . TONSILLECTOMY      reports that he quit smoking about 34 years ago. His smoking use included cigarettes. He has a 20.00 pack-year smoking history. He has never used smokeless tobacco. He reports that he does not drink alcohol or use drugs. family history includes Alcohol abuse in his other; Diabetes in his father; Heart disease in his father; Stroke in his brother and father. No Known Allergies   Review of Systems  Constitutional: Negative for fatigue.  Eyes: Negative for visual disturbance.  Respiratory: Negative for cough, chest tightness and shortness of breath.   Cardiovascular: Negative for chest pain, palpitations and leg swelling.  Endocrine: Negative for polydipsia and polyuria.  Neurological: Negative for dizziness, syncope, weakness, light-headedness and headaches.       Objective:   Physical Exam  Constitutional: He appears well-developed and well-nourished.  Cardiovascular: Normal rate and regular rhythm.  Pulmonary/Chest: Effort normal and breath sounds  normal. No respiratory distress. He has no wheezes. He has no rales.  Musculoskeletal: He exhibits no edema.       Assessment:     #1 history of lacunar CVA-currently stable  #2 hypertension stable and at goal  #3 dyslipidemia with recent LDL cholesterol 80 with goal less than 70  #4 type 2 diabetes controlled with A1c 6.4%    Plan:     -Discussed diet in great detail. Continue low saturated fat diet and low glycemic diet. Recheck fasting lipid and A1c in 6 months. Hopefully we can get his lipids to goal without further medication -Continue close follow-up with neurology regarding his sleep apnea  [name] MD [facility]

## 2018-01-15 ENCOUNTER — Encounter: Payer: Self-pay | Admitting: Neurology

## 2018-01-25 ENCOUNTER — Telehealth: Payer: Self-pay | Admitting: Neurology

## 2018-01-25 NOTE — Telephone Encounter (Signed)
I reviewed patient's CPAP compliance data for the past 7 days. He has been compliant with treatment. Residual AHI of 13.5 but he does have central apneas as well, almost 5 per hour. Average usage is nearly 7 hours, leak is on the higher end.  Please advise patient that we probably should continue at the current settings, as we need more data and as he gets used to using CPAP, his central apneas may improve. I would not want to change anything quite yet with the exception that he may want to talk to his respiratory therapist about a better mask fit. I commend him for his compliance and would encourage him to keep using CPAP.

## 2018-01-25 NOTE — Telephone Encounter (Signed)
Pt returned my call. I advised him of Dr. Guadelupe Sabin recommendations. Pt will speak to [facility] regarding a new mask fit and will continue using the cpap compliantly at this time. Pt verbalized understanding of the recommendations.

## 2018-01-25 NOTE — Telephone Encounter (Signed)
Pt called he is having increased events for the past couple of weeks and respiratory therapist has suggested he call the clinic. Please call to advise

## 2018-01-25 NOTE — Telephone Encounter (Signed)
I called pt to discuss. No answer, left a message asking him to call me back. 

## 2018-02-08 ENCOUNTER — Telehealth: Payer: Self-pay | Admitting: Neurology

## 2018-02-08 DIAGNOSIS — G4731 Primary central sleep apnea: Secondary | ICD-10-CM

## 2018-02-08 DIAGNOSIS — G4733 Obstructive sleep apnea (adult) (pediatric): Secondary | ICD-10-CM

## 2018-02-08 NOTE — Telephone Encounter (Signed)
I reviewed patient's CPAP compliance data for the past 30 days, he has been fully compliant with treatment, residual AHI is suboptimal at 14.7 per hour with nearly 50% central events. Pressure is currently at 12 cm with EPR of 3. We can try him on standard BiPAP of 14/10 cm and see if his sleep disorder breathing improved, we will follow-up as scheduled on 03/22/18. Please adv pt of the BiPAP trial and send order to DME.

## 2018-02-08 NOTE — Telephone Encounter (Signed)
I called pt, spoke to pt's wife, James Pugh, per DPR, and explained Dr. Guadelupe Sabin recommendations to her. She is agreeable to pt starting a bipap. I advised her that we will keep the July appt and if pt's insurance requires a later appt date, I will call pt again. Pt's wife verbalized understanding.  Order faxed to [place] [facility].

## 2018-02-23 ENCOUNTER — Encounter: Payer: Self-pay | Admitting: Family Medicine

## 2018-02-23 DIAGNOSIS — Z85828 Personal history of other malignant neoplasm of skin: Secondary | ICD-10-CM | POA: Diagnosis not present

## 2018-02-23 DIAGNOSIS — C44319 Basal cell carcinoma of skin of other parts of face: Secondary | ICD-10-CM | POA: Diagnosis not present

## 2018-02-23 DIAGNOSIS — S80862S Insect bite (nonvenomous), left lower leg, sequela: Secondary | ICD-10-CM | POA: Diagnosis not present

## 2018-02-23 DIAGNOSIS — D0439 Carcinoma in situ of skin of other parts of face: Secondary | ICD-10-CM | POA: Diagnosis not present

## 2018-02-23 DIAGNOSIS — D485 Neoplasm of uncertain behavior of skin: Secondary | ICD-10-CM | POA: Diagnosis not present

## 2018-03-09 ENCOUNTER — Telehealth: Payer: Self-pay | Admitting: Licensed Clinical Social Worker

## 2018-03-09 NOTE — Telephone Encounter (Signed)
Palliative Care SW phoned patient's home and spoke with his wife, Jeani Hawking.  SW provided extensive education regarding the [facility] program and Palliative Care.  Jeani Hawking stated she was open to the SW phoning her in two week for a possible visit.

## 2018-03-13 ENCOUNTER — Ambulatory Visit: Payer: Self-pay | Admitting: Neurology

## 2018-03-22 ENCOUNTER — Encounter: Payer: Self-pay | Admitting: Neurology

## 2018-03-22 ENCOUNTER — Encounter

## 2018-03-22 ENCOUNTER — Ambulatory Visit (INDEPENDENT_AMBULATORY_CARE_PROVIDER_SITE_OTHER): Payer: Medicare Other | Admitting: Neurology

## 2018-03-22 ENCOUNTER — Ambulatory Visit: Payer: Medicare Other | Admitting: Neurology

## 2018-03-22 VITALS — BP 126/67 | HR 54 | Ht 66.0 in | Wt 151.5 lb

## 2018-03-22 DIAGNOSIS — G4733 Obstructive sleep apnea (adult) (pediatric): Secondary | ICD-10-CM | POA: Diagnosis not present

## 2018-03-22 DIAGNOSIS — I6381 Other cerebral infarction due to occlusion or stenosis of small artery: Secondary | ICD-10-CM

## 2018-03-22 NOTE — Patient Instructions (Signed)
You have done a great job using your BiPAP! Keep up the good work.  We will increase the pressure to 16/12 cm and review your numbers next month.

## 2018-03-22 NOTE — Progress Notes (Signed)
Received this notice from [facility]: "I received that order for pressure change and that change has been made. I called the patient to notify, no answer LVM.  Just wanted to let you know this has been completed."

## 2018-03-22 NOTE — Progress Notes (Signed)
Subjective:    Patient ID: James Pugh is a 72 y.o. male.  HPI     Interim history:   [name] is a 72 year old right-handed gentleman with an underlying medical history of diabetes, acute stroke earlier this month in the right pons, hyperlipidemia, and overweight state, who presents for follow-up consultation of his obstructive sleep apnea after sleep study testing and starting therapy. The patient is accompanied by his wife today. I first met him on 11/14/2017 at the request of Dr. Erlinda Hong, , at which time he was reported to have witnessed breathing pauses while asleep, snoring and excessive daytime somnolence. He was advised to proceed with sleep study testing. He had a split-night sleep study on 12/20/2017. I went over his test results with him in detail today. He had significant sleep disruption at baseline, he had severe events, particularly in rem sleep. He did fairly well on CPAP but did have difficulty maintaining sleep later on. He was briefly tried on BiPAP of 14/10 but barely achieved any sleep on BiPAP during the study. I suggested a home treatment pressure of 12 cm. He called in the interim reporting residual increase in events. His AHI was around 15 on a pressure of 12 cm and in mid-May I changed his pressure setting to BiPAP of 14/10. He has had difficulty with mask tolerance  and seal and try different masks in the interim. He does report difficulty getting in touch with his DME company recently.    Today, 03/22/2018: I reviewed his BiPAP compliance data from the last 30 days which is from 02/19/2018 through 03/20/2018, during which time he used his machine every night except for 1, percent used days greater than 4 hours was 97% which is excellent, average usage also excellent at 6 hours and 59 minutes, residual AHI suboptimal at 14.8 per hour primarily because of obstructive events. Previously on CPAP he did have more central events. Seal is very good, 95th percentile of leak at 2.6  L/m on a pressure of 14/10. Ramp time per wife's report is 5 minutes. Sometimes it feels to him that he does not get enough air. He has some mouth dryness but humidity was increased recently. He is motivated to continue with treatment and reported some improvement in sleep quality and wife is reporting improved daytime energy as he does not dose office frequently as he used to. He does have nocturia typically once and maybe twice per average night.  Previously:   11/14/2017: (He) reports snoring and excessive daytime somnolence. He has been witnessed to have witnessed breathing pauses while asleep. He has woken up with a sense of shortness of breath. He had extensive stroke workup during his hospitalization from 10/22/2017 through 10/24/2017. He was in an inpatient rehabilitation from 10/24/2017 through 10/31/2017. I reviewed the hospital records. His Epworth sleepiness score is 9 out of 24 today, fatigue score is 12 out of 63.  He sleeps on his sides and back. BT is around 11 PM and WT around 7 AM. He is retired from [employer], Engineer, maintenance (IT). Quit smoking about 40 years, no EtOH, he does not drink caffeine on a regular basis, no FHx of OSA. He had a TE as a child.  He denies AM HAs, has nocturia about 1-2/night on average. He takes a nap in the afternoons sometimes.   His Past Medical History Is Significant For: Past Medical History:  Diagnosis Date  . CERUMEN IMPACTION 08/22/2009  . HYPERLIPIDEMIA 06/06/2009  . PREDIABETES 07/13/2010  .  Stroke [facility])     His Past Surgical History Is Significant For: Past Surgical History:  Procedure Laterality Date  . TONSILLECTOMY      His Family History Is Significant For: Family History  Problem Relation Age of Onset  . Stroke Father   . Heart disease Father   . Diabetes Father        type ll  . Alcohol abuse Other   . Stroke Brother     His Social History Is Significant For: Social History   Socioeconomic History  . Marital status: Married     Spouse name: Not on file  . Number of children: Not on file  . Years of education: Not on file  . Highest education level: Not on file  Occupational History  . Not on file  Social Needs  . Financial resource strain: Not on file  . Food insecurity:    Worry: Not on file    Inability: Not on file  . Transportation needs:    Medical: Not on file    Non-medical: Not on file  Tobacco Use  . Smoking status: Former Smoker    Packs/day: 1.00    Years: 20.00    Pack years: 20.00    Types: Cigarettes    Last attempt to quit: 08/11/1983    Years since quitting: 34.6  . Smokeless tobacco: Never Used  Substance and Sexual Activity  . Alcohol use: No  . Drug use: No  . Sexual activity: Not on file  Lifestyle  . Physical activity:    Days per week: Not on file    Minutes per session: Not on file  . Stress: Not on file  Relationships  . Social connections:    Talks on phone: Not on file    Gets together: Not on file    Attends religious service: Not on file    Active member of club or organization: Not on file    Attends meetings of clubs or organizations: Not on file    Relationship status: Not on file  Other Topics Concern  . Not on file  Social History Narrative  . Not on file    His Allergies Are:  No Known Allergies:   His Current Medications Are:  Outpatient Encounter Medications as of 03/22/2018  Medication Sig  . atorvastatin (LIPITOR) 80 MG tablet TAKE 1 TABLET BY MOUTH EVERY DAY AT 6PM  . clopidogrel (PLAVIX) 75 MG tablet Take 1 tablet (75 mg total) by mouth daily.  Marland Kitchen lisinopril (PRINIVIL,ZESTRIL) 2.5 MG tablet Take 1 tablet (2.5 mg total) by mouth daily.   No facility-administered encounter medications on file as of 03/22/2018.   : Review of Systems:  Out of a complete 14 point review of systems, all are reviewed and negative with the exception of these symptoms as listed below: Review of Systems  Neurological:       Patient reports having about 20-30 "events".  Having a lot of trouble with the machine.     Objective:  Neurological Exam  Physical Exam Physical Examination:   Vitals:   03/22/18 1010  BP: 126/67  Pulse: (!) 54   General Examination: The patient is a very pleasant 72 y.o. male in no acute distress. He appears well-developed and well groomed.   HEENT: Normocephalic, atraumatic, pupils are equal, round and reactive to light and accommodation. S/p cataract surgeries. Extraocular tracking is good without limitation to gaze excursion or nystagmus noted. Normal smooth pursuit is noted. Hearing is grossly intact.  Face is symmetric with normal facial animation and normal facial sensation. Speech is clear with no dysarthria noted. There is no hypophonia. There is no lip, neck/head, jaw or voice tremor. Neck shows full range of motion. Airway examination reveals no new findings, mild mouth dryness.   Chest: Clear to auscultation without wheezing, rhonchi or crackles noted.  Heart: S1+S2+0, regular and normal without murmurs, rubs or gallops noted.   Abdomen: Soft, non-tender and non-distended with normal bowel sounds appreciated on auscultation.  Extremities: There is no edema in the distal lower extremities bilaterally.   Skin: Warm and dry without trophic changes noted.  Musculoskeletal: exam reveals no obvious joint deformities, tenderness or joint swelling or erythema.   Neurologically:  Mental status: The patient is awake, alert and oriented in all 4 spheres. His immediate and remote memory, attention, language skills and fund of knowledge are appropriate. There is no evidence of aphasia, agnosia, apraxia or anomia. Speech is clear with normal prosody and enunciation. Thought process is linear. Mood is normal and affect is normal.  Cranial nerves II - XII are as described above under HEENT exam. In addition: shoulder shrug is normal with equal shoulder height noted. Motor exam: Normal bulk, and tone is noted. There is no  tremor. Fine motor skills and coordination: grossly intact.  Cerebellar testing: No dysmetria or intention tremor. There is no truncal or gait ataxia.  Sensory exam: intact to light touch.  Gait, station and balance: He stands easily. No veering to one side is noted. No leaning to one side is noted. Posture is age-appropriate and stance is narrow based. Gait shows normal stride length and normal pace. No problems turning are noted.             Assessment and Plan:  In summary, ZAIDAN KEEBLE is a very pleasant 72 year old male with an underlying medical history of diabetes, acute stroke earlier this month in the right pons, hyperlipidemia, and borderline overweight state, who presents for follow-up consultation of his obstructive sleep apnea which was determined to be severe during his split-night study in April 2019. He started CPAP therapy for about a month and is now on BiPAP for the past 5 weeks or so. He tolerates the BiPAP better than the CPAP, sometimes it feels like the pressure is not enough. He does have residual obstructive more than central events and I would like to increase his BiPAP pressure to 16/12 cm at this time. He is advised to follow-up in about a month from now, maybe 5 weeks for a follow-up and a recheck on his numbers and symptoms. He does note an improvement in his sleep quality, sleep consolidation and daytime symptoms already. He is highly commended for his excellent treatment adherence and his wife is very supportive. Physical exam is stable. I plan to see him back in about 5 weeks from now. Of note, he uses a ResMed air fit F30 full face mask with good success, good tolerance and excellent seal.  I answered all their questions today and the patient and his wife were in agreement with the plan. I spent 25 minutes in total face-to-face time with the patient, more than 50% of which was spent in counseling and coordination of care, reviewing test results, reviewing medication and  discussing or reviewing the diagnosis of OSA, its prognosis and treatment options. Pertinent laboratory and imaging test results that were available during this visit with the patient were reviewed by me and considered in my medical decision  making (see chart for details).

## 2018-03-29 ENCOUNTER — Telehealth: Payer: Self-pay | Admitting: Licensed Clinical Social Worker

## 2018-03-29 NOTE — Telephone Encounter (Signed)
Palliative Care SW spoke with patient's wife, Jeani Hawking, to set up a visit.  She said they were out of town until Labor Day and would like a call then.  Team was advised.

## 2018-04-24 ENCOUNTER — Ambulatory Visit: Payer: Medicare Other | Admitting: Neurology

## 2018-05-03 ENCOUNTER — Ambulatory Visit (INDEPENDENT_AMBULATORY_CARE_PROVIDER_SITE_OTHER): Payer: Medicare Other | Admitting: Neurology

## 2018-05-03 ENCOUNTER — Other Ambulatory Visit: Payer: Self-pay

## 2018-05-03 VITALS — BP 110/60 | HR 57

## 2018-05-03 DIAGNOSIS — I6381 Other cerebral infarction due to occlusion or stenosis of small artery: Secondary | ICD-10-CM | POA: Diagnosis not present

## 2018-05-03 DIAGNOSIS — G4733 Obstructive sleep apnea (adult) (pediatric): Secondary | ICD-10-CM

## 2018-05-03 DIAGNOSIS — G4731 Primary central sleep apnea: Secondary | ICD-10-CM

## 2018-05-03 NOTE — Progress Notes (Signed)
Order for bipap pressure change sent to [facility].

## 2018-05-03 NOTE — Progress Notes (Signed)
Subjective:    Patient ID: BRAILYN DELMAN is a 72 y.o. male.  HPI     Interim history:  Mr. Turko is a 72 year old right-handed gentleman with an underlying medical history of diabetes, acute stroke earlier this month in the right pons, hyperlipidemia, and overweight state, who presents for follow-up consultation of his obstructive sleep apnea on BiPAP therapy. The patient is accompanied by his wife again today. I last saw him on 03/22/2018, at which time he was compliant with BiPAP but his residual AHI was around 15. I suggested we increase the pressure. He did report that sometimes it felt like he was not getting enough air in. Overall, he felt an improvement in his sleep quality and his wife noted that his daytime energy was better.   Today, 05/03/2018: I reviewed his BiPAP compliance data from 04/02/2018 through 05/01/2018 which is a total of 30 days, during which time he used his BiPAP every night with percent used days greater than 4 hours at 100%, indicating superb compliance with an average usage of 6 hours and 40 minutes, residual AHI slightly suboptimal still at 10 per hour but improved from before, leak low with the 95th percentile at 3.4 L/m on a pressure of 16/12 cm. He reports tolerating the increase in pressure. He feels a little improved. He does notice the fluctuation in his events on a night to night basis. 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 The hatching season will come to a 1 by September. His wife has noticed that since he has been on BiPAP, he seems to sleep better at night. He is less sleepy during the day and less likely to does off inadvertently. He is overall motivated to continue with treatment. He has had no recent medication changes, latest A1c was 6.4 and he has recheck blood work pending soon. His  cholesterol panel in March was at goal.No new neurological symptoms thankfully. He would like to have the ramp time turned off on the machine. I will order this through his DME company.   Previously:     I first met him on 11/14/2017 at the request of Dr. Erlinda Hong, at which time he was reported to have witnessed breathing pauses while asleep, snoring and excessive daytime somnolence. He was advised to proceed with sleep study testing. He had a split-night sleep study on 12/20/2017. I went over his test results with him in detail today. He had significant sleep disruption at baseline, he had severe events, particularly in rem sleep. He did fairly well on CPAP but did have difficulty maintaining sleep later on. He was briefly tried on BiPAP of 14/10 but barely achieved any sleep on BiPAP during the study. I suggested a home treatment pressure of 12 cm. He called in the interim reporting residual increase in events. His AHI was around 15 on a pressure of 12 cm and in mid-May I changed his pressure setting to BiPAP of 14/10. He has had difficulty with mask tolerance  and seal and try different masks in the interim. He does report difficulty getting in touch with his DME company recently.     I reviewed his BiPAP compliance data from the last 30 days which is from 02/19/2018 through 03/20/2018, during which time he used his machine every night except for  1, percent used days greater than 4 hours was 97% which is excellent, average usage also excellent at 6 hours and 59 minutes, residual AHI suboptimal at 14.8 per hour primarily because of obstructive events. Previously on CPAP he did have more central events. Seal is very good, 95th percentile of leak at 2.6 L/m on a pressure of 14/10. Ramp time per wife's report is 5 minutes.    11/14/2017: (He) reports snoring and excessive daytime somnolence. He has been witnessed to have witnessed breathing pauses while asleep. He has woken up with a sense of shortness of  breath. He had extensive stroke workup during his hospitalization from 10/22/2017 through 10/24/2017. He was in an inpatient rehabilitation from 10/24/2017 through 10/31/2017. I reviewed the hospital records. His Epworth sleepiness score is 9 out of 24 today, fatigue score is 12 out of 63.  He sleeps on his sides and back. BT is around 11 PM and WT around 7 AM. He is retired from [employer], Engineer, maintenance (IT). Quit smoking about 40 years, no EtOH, he does not drink caffeine on a regular basis, no FHx of OSA. He had a TE as a child.  He denies AM HAs, has nocturia about 1-2/night on average. He takes a nap in the afternoons sometimes.   His Past Medical History Is Significant For: Past Medical History:  Diagnosis Date  . CERUMEN IMPACTION 08/22/2009  . HYPERLIPIDEMIA 06/06/2009  . PREDIABETES 07/13/2010  . Stroke [facility])     His Past Surgical History Is Significant For: Past Surgical History:  Procedure Laterality Date  . TONSILLECTOMY      His Family History Is Significant For: Family History  Problem Relation Age of Onset  . Stroke Father   . Heart disease Father   . Diabetes Father        type ll  . Alcohol abuse Other   . Stroke Brother     His Social History Is Significant For: Social History   Socioeconomic History  . Marital status: Married    Spouse name: Not on file  . Number of children: Not on file  . Years of education: Not on file  . Highest education level: Not on file  Occupational History  . Not on file  Social Needs  . Financial resource strain: Not on file  . Food insecurity:    Worry: Not on file    Inability: Not on file  . Transportation needs:    Medical: Not on file    Non-medical: Not on file  Tobacco Use  . Smoking status: Former Smoker    Packs/day: 1.00    Years: 20.00    Pack years: 20.00    Types: Cigarettes    Last attempt to quit: 08/11/1983    Years since quitting: 34.7  . Smokeless tobacco: Never Used  Substance and Sexual Activity  .  Alcohol use: No  . Drug use: No  . Sexual activity: Not on file  Lifestyle  . Physical activity:    Days per week: Not on file    Minutes per session: Not on file  . Stress: Not on file  Relationships  . Social connections:    Talks on phone: Not on file    Gets together: Not on file    Attends religious service: Not on file    Active member of club or organization: Not on file    Attends meetings of clubs or organizations: Not on file    Relationship status: Not on file  Other Topics Concern  . Not on file  Social History Narrative  . Not on file    His Allergies Are:  No Known Allergies:   His Current Medications Are:  Outpatient Encounter Medications as of 05/03/2018  Medication Sig  . atorvastatin (LIPITOR) 80 MG tablet TAKE 1 TABLET BY MOUTH EVERY DAY AT 6PM  . clopidogrel (PLAVIX) 75 MG tablet Take 1 tablet (75 mg total) by mouth daily.  Marland Kitchen lisinopril (PRINIVIL,ZESTRIL) 2.5 MG tablet Take 1 tablet (2.5 mg total) by mouth daily.   No facility-administered encounter medications on file as of 05/03/2018.   :  Review of Systems:  Out of a complete 14 point review of systems, all are reviewed and negative with the exception of these symptoms as listed below: Review of Systems  Neurological:       Room 1 with wife. He is using Bipap regularly. Feels it is inconsistent. Pressure settings vary each night. He has no other complaints.     Objective:  Neurological Exam  Physical Exam Physical Examination:   Vitals:   05/03/18 0753  BP: 110/60  Pulse: (!) 57  SpO2: 98%   General Examination: The patient is a very pleasant 72 y.o. male in no acute distress. He appears well-developed and well-nourished and well groomed. Good spirits.   HEENT:Normocephalic, atraumatic, pupils are equal, round and reactive to light and accommodation. S/p cataract surgeries, corrective eyeglasses in place.Extraocular tracking is good without limitation to gaze excursion or nystagmus noted.  Normal smooth pursuit is noted. Hearing is grossly intact. Face is symmetric with normal facial animation and normal facial sensation. Speech is clear with no dysarthria noted. There is no hypophonia. There is no lip, neck/head, jaw or voice tremor. Neck shows full range of motion. Airway examination reveals no new findings.   Chest:Clear to auscultation without wheezing, rhonchi or crackles noted.  Heart:S1+S2+0, regular and normal without murmurs, rubs or gallops noted.   Abdomen:Soft, non-tender and non-distended with normal bowel sounds appreciated on auscultation.  Extremities:There isno edema in the distal lower extremities bilaterally.   Skin: Warm and dry without trophic changes noted. Sun exposure type changes.   Musculoskeletal: exam reveals no obvious joint deformities, tenderness or joint swelling or erythema.   Neurologically:  Mental status: The patient is awake, alert and oriented in all 4 spheres.Hisimmediate and remote memory, attention, language skills and fund of knowledge are appropriate. There is no evidence of aphasia, agnosia, apraxia or anomia. Speech is clear with normal prosody and enunciation. Thought process is linear. Mood is normaland affect is normal.  Cranial nerves II - XII are as described above under HEENT exam.  Motor exam: Normal bulk, and tone is noted. There is no tremor. Fine motor skills and coordination:grosslyintact.  Cerebellar testing: No dysmetria or intention tremor. There is no truncal or gait ataxia.  Sensory exam: intact to light touch.  Gait, station and balance:Hestands easily. No veering to one side is noted. No leaning to one side is noted. Posture is age-appropriate and stance is narrow based. Gait showsnormalstride length and normalpace. No problems turning are noted.  Assessmentand Plan:  In summary,Rushawn H Collieris a very pleasant 36 year oldmalewith an underlying medical history of diabetes,  acute stroke earlier this month in the right pons, hyperlipidemia, andborderlineoverweight state, who presents for follow-up consultation of his obstructive sleep apnea which was determined to be severe during his split-night study in April 2019. He started CPAP therapy for about a month and switched to BiPAP therapy secondary  to central apneas noted. He was able to tolerate BiPAP better than CPAP thankfully. We increase the pressure to 16/12 last time as his residual AHI was right around 15, his residual AHI is around 10 at this time. He would be willing to try one more BiPAP pressure increased to 17/13 cm. We will also turn off the ramp time as per his request. He is fully compliant with treatment and highly commended for this. He has noted benefit from treatment in the sense that his sleep is better quality and better consolidated, he does report ongoing issues generally speaking with sleep that date back 2 years ago. He has a tendency to think about things at night. Nevertheless, he has also noticed daytime symptom improvement in that he is less tired and sleepy during the day. He is encouraged to continue with treatment. They are advised to call our office in about 4-6 weeks for an update as to his symptoms and so we can look at another download on the new pressure. Of note, he uses a ResMed air fit F30 full face mask with good success, good tolerance and excellent seal. He is advised to follow-up routinely in 6 months and hopefully yearly thereafter. I answered all their questions today and the patient and his wife were in agreement. I spent 25 minutes in total face-to-face time with the patient, more than 50% of which was spent in counseling and coordination of care, reviewing test results, reviewing medication and discussing or reviewing the diagnosis of OSA and CSA, the prognosis and treatment options. Pertinent laboratory and imaging test results that were available during this visit with the patient  were reviewed by me and considered in my medical decision making (see chart for details).

## 2018-05-03 NOTE — Patient Instructions (Addendum)
As discussed, we will increase your BiPAP pressure 1 notch to 17/13 cm. I will fax the request to your DME company, [facility].  If you do not see a change in the pressure setting, please let us know in the next week or 2. You can also call your DME company directly.  Please call in 4-6 weeks or email through My Chart for an update on your symptoms and to remind [place] to print another compliance "download" to see if your sleep apnea is under better control on the increased pressure. If all goes well, I can see you back routinely in 6 months and yearly thereafter.  Please continue using your BiPAP regularly. While your insurance requires that you use BiPAP at least 4 hours each night on 70% of the nights, I recommend, that you not skip any nights and use it throughout the night if you can. Getting used to BiPAP and staying with the treatment long term does take time and patience and discipline. Untreated obstructive sleep apnea when it is moderate to severe can have an adverse impact on cardiovascular health and raise her risk for heart disease, arrhythmias, hypertension, congestive heart failure, stroke and diabetes. Untreated obstructive sleep apnea causes sleep disruption, nonrestorative sleep, and sleep deprivation. This can have an impact on your day to day functioning and cause daytime sleepiness and impairment of cognitive function, memory loss, mood disturbance, and problems focussing. Using BiPAP regularly can improve these symptoms.  Keep up the good work, you have done rather well with your usage!

## 2018-05-16 ENCOUNTER — Other Ambulatory Visit: Payer: Self-pay | Admitting: Family Medicine

## 2018-05-17 ENCOUNTER — Telehealth: Payer: Self-pay | Admitting: Licensed Clinical Social Worker

## 2018-05-17 NOTE — Telephone Encounter (Signed)
Palliative Care SW made follow up phone call to patient's wife, Jeani Hawking, per her request.  Home visit is scheduled for 9/19 at 1pm.

## 2018-05-22 ENCOUNTER — Ambulatory Visit (INDEPENDENT_AMBULATORY_CARE_PROVIDER_SITE_OTHER): Payer: Medicare Other | Admitting: Family Medicine

## 2018-05-22 ENCOUNTER — Encounter: Payer: Self-pay | Admitting: Family Medicine

## 2018-05-22 VITALS — BP 118/58 | HR 60 | Temp 98.1°F | Wt 155.2 lb

## 2018-05-22 DIAGNOSIS — I6381 Other cerebral infarction due to occlusion or stenosis of small artery: Secondary | ICD-10-CM | POA: Diagnosis not present

## 2018-05-22 DIAGNOSIS — R159 Full incontinence of feces: Secondary | ICD-10-CM | POA: Diagnosis not present

## 2018-05-22 DIAGNOSIS — R21 Rash and other nonspecific skin eruption: Secondary | ICD-10-CM | POA: Diagnosis not present

## 2018-05-22 MED ORDER — NYSTATIN 100000 UNIT/GM EX CREA: 1.0000 "application " | TOPICAL_CREAM | Freq: Two times a day (BID) | CUTANEOUS | 1 refills | Status: DC | PRN

## 2018-05-22 NOTE — Progress Notes (Signed)
  Subjective:     Patient ID: James Pugh, male   DOB: Dec 09, 1945, 72 y.o.   MRN: [id number]  HPI Patient seen with some mild perirectal irritation. He's had some itching just above the anal region and states he's had some mild "seepage" over the past couple weeks. He reportedly had colonoscopy January 2010. No recent bloody discharge. Denies constipation. No diarrhea. No appetite or weight changes. No pain with stools. Bowel habits have been fairly regular.  Past Medical History:  Diagnosis Date  . CERUMEN IMPACTION 08/22/2009  . HYPERLIPIDEMIA 06/06/2009  . PREDIABETES 07/13/2010  . Stroke [facility])    Past Surgical History:  Procedure Laterality Date  . TONSILLECTOMY      reports that he quit smoking about 34 years ago. His smoking use included cigarettes. He has a 20.00 pack-year smoking history. He has never used smokeless tobacco. He reports that he does not drink alcohol or use drugs. family history includes Alcohol abuse in his other; Diabetes in his father; Heart disease in his father; Stroke in his brother and father. No Known Allergies   Review of Systems  Constitutional: Negative for appetite change, chills, fever and unexpected weight change.  Gastrointestinal: Negative for abdominal distention, abdominal pain, blood in stool, constipation, diarrhea and rectal pain.       Objective:   Physical Exam  Constitutional: He appears well-developed and well-nourished.  Cardiovascular: Normal rate and regular rhythm.  Pulmonary/Chest: Effort normal and breath sounds normal.  Genitourinary:  Genitourinary Comments: Rectal exam reveals no external hemorrhoids. No visible fissure. Digital exam reveals symmetrically enlarged prostate but no rectal mass. minimal stool in rectal vault-brown and heme-negative.  Normal sphincter tone.  Skin:  Mild erythema about 2 cm above the anal region in an area about 2 x 3 centimeters with some mild scaling. No vesicles. No pustules.        Assessment:     #1 perianal rash. Question Candida  #2 intermittent rectal seepage. No impaction    Plan:     -nystatin cream once or twice daily and touch base in 2 weeks if itching and rash not improved -Consider once daily fiber supplement such as FiberCon, Metamucil, or Citrucel -Consider colonoscopy/GI referral if symptoms not improving over the next couple weeks. He has scheduled follow-up for October 25  [name] MD [facility]

## 2018-05-22 NOTE — Patient Instructions (Signed)
Let me know if itching and rash not improving in two weeks  Consider daily fiber supplement such as Fibercon, Metamucil , or Citrucel   Let me know if discharge not better in two weeks.

## 2018-05-24 ENCOUNTER — Emergency Department (HOSPITAL_COMMUNITY): Payer: Medicare Other

## 2018-05-24 ENCOUNTER — Encounter (HOSPITAL_COMMUNITY): Payer: Self-pay | Admitting: Emergency Medicine

## 2018-05-24 ENCOUNTER — Other Ambulatory Visit: Payer: Self-pay

## 2018-05-24 ENCOUNTER — Observation Stay (HOSPITAL_COMMUNITY): Payer: Medicare Other

## 2018-05-24 ENCOUNTER — Observation Stay (HOSPITAL_COMMUNITY)
Admission: EM | Admit: 2018-05-24 | Discharge: 2018-05-25 | Disposition: A | Discharge status: Home / Self Care | Payer: Medicare Other | Attending: Internal Medicine | Admitting: Internal Medicine

## 2018-05-24 DIAGNOSIS — Z79899 Other long term (current) drug therapy: Secondary | ICD-10-CM | POA: Insufficient documentation

## 2018-05-24 DIAGNOSIS — I10 Essential (primary) hypertension: Secondary | ICD-10-CM | POA: Diagnosis not present

## 2018-05-24 DIAGNOSIS — E049 Nontoxic goiter, unspecified: Secondary | ICD-10-CM

## 2018-05-24 DIAGNOSIS — Z8249 Family history of ischemic heart disease and other diseases of the circulatory system: Secondary | ICD-10-CM | POA: Diagnosis not present

## 2018-05-24 DIAGNOSIS — Z8673 Personal history of transient ischemic attack (TIA), and cerebral infarction without residual deficits: Secondary | ICD-10-CM | POA: Insufficient documentation

## 2018-05-24 DIAGNOSIS — R0789 Other chest pain: Secondary | ICD-10-CM | POA: Diagnosis not present

## 2018-05-24 DIAGNOSIS — G4733 Obstructive sleep apnea (adult) (pediatric): Secondary | ICD-10-CM | POA: Insufficient documentation

## 2018-05-24 DIAGNOSIS — Z7902 Long term (current) use of antithrombotics/antiplatelets: Secondary | ICD-10-CM | POA: Insufficient documentation

## 2018-05-24 DIAGNOSIS — R079 Chest pain, unspecified: Secondary | ICD-10-CM | POA: Insufficient documentation

## 2018-05-24 DIAGNOSIS — I639 Cerebral infarction, unspecified: Secondary | ICD-10-CM | POA: Diagnosis present

## 2018-05-24 DIAGNOSIS — Z87891 Personal history of nicotine dependence: Secondary | ICD-10-CM | POA: Diagnosis not present

## 2018-05-24 DIAGNOSIS — R0600 Dyspnea, unspecified: Secondary | ICD-10-CM | POA: Diagnosis not present

## 2018-05-24 DIAGNOSIS — E119 Type 2 diabetes mellitus without complications: Secondary | ICD-10-CM | POA: Insufficient documentation

## 2018-05-24 DIAGNOSIS — E785 Hyperlipidemia, unspecified: Secondary | ICD-10-CM | POA: Diagnosis not present

## 2018-05-24 DIAGNOSIS — E042 Nontoxic multinodular goiter: Secondary | ICD-10-CM | POA: Diagnosis not present

## 2018-05-24 DIAGNOSIS — J984 Other disorders of lung: Secondary | ICD-10-CM | POA: Diagnosis not present

## 2018-05-24 LAB — CBC WITH DIFFERENTIAL/PLATELET
ABS IMMATURE GRANULOCYTES: 0 10*3/uL (ref 0.0–0.1)
Basophils Absolute: 0.1 10*3/uL (ref 0.0–0.1)
Basophils Relative: 1 %
Eosinophils Absolute: 0.2 10*3/uL (ref 0.0–0.7)
Eosinophils Relative: 2 %
HEMATOCRIT: 40.4 % (ref 39.0–52.0)
HEMOGLOBIN: 13.2 g/dL (ref 13.0–17.0)
Immature Granulocytes: 0 %
LYMPHS ABS: 1.5 10*3/uL (ref 0.7–4.0)
LYMPHS PCT: 14 %
MCH: 29.8 pg (ref 26.0–34.0)
MCHC: 32.7 g/dL (ref 30.0–36.0)
MCV: 91.2 fL (ref 78.0–100.0)
MONO ABS: 1 10*3/uL (ref 0.1–1.0)
MONOS PCT: 9 %
Neutro Abs: 8.2 10*3/uL — ABNORMAL HIGH (ref 1.7–7.7)
Neutrophils Relative %: 74 %
Platelets: 284 10*3/uL (ref 150–400)
RBC: 4.43 MIL/uL (ref 4.22–5.81)
RDW: 13.4 % (ref 11.5–15.5)
WBC: 11 10*3/uL — AB (ref 4.0–10.5)

## 2018-05-24 LAB — COMPREHENSIVE METABOLIC PANEL
ALBUMIN: 3.7 g/dL (ref 3.5–5.0)
ALK PHOS: 52 U/L (ref 38–126)
ALT: 41 U/L (ref 0–44)
AST: 28 U/L (ref 15–41)
Anion gap: 9 (ref 5–15)
BUN: 11 mg/dL (ref 8–23)
CALCIUM: 9.6 mg/dL (ref 8.9–10.3)
CO2: 26 mmol/L (ref 22–32)
CREATININE: 0.95 mg/dL (ref 0.61–1.24)
Chloride: 101 mmol/L (ref 98–111)
GFR calc Af Amer: >60 mL/min (ref >60)
GFR calc non Af Amer: >60 mL/min (ref >60)
GLUCOSE: 123 mg/dL — AB (ref 70–99)
Potassium: 4.5 mmol/L (ref 3.5–5.1)
SODIUM: 136 mmol/L (ref 135–145)
Total Bilirubin: 0.6 mg/dL (ref 0.3–1.2)
Total Protein: 6.7 g/dL (ref 6.5–8.1)

## 2018-05-24 LAB — GLUCOSE, CAPILLARY
Glucose-Capillary: 123 mg/dL — ABNORMAL HIGH (ref 70–99)
Glucose-Capillary: 133 mg/dL — ABNORMAL HIGH (ref 70–99)

## 2018-05-24 LAB — I-STAT TROPONIN, ED: Troponin i, poc: 0.03 ng/mL (ref 0.00–0.08)

## 2018-05-24 LAB — TROPONIN I: Troponin I: <0.03 ng/mL (ref <0.03)

## 2018-05-24 LAB — TSH: TSH: 0.318 u[IU]/mL — ABNORMAL LOW (ref 0.350–4.500)

## 2018-05-24 MED ORDER — ONDANSETRON HCL 4 MG/2ML IJ SOLN: 4.0000 mg | Freq: Four times a day (QID) | INTRAMUSCULAR | Status: DC | PRN

## 2018-05-24 MED ORDER — IOPAMIDOL (ISOVUE-370) INJECTION 76%
100.0000 mL | Freq: Once | INTRAVENOUS | Status: AC
  Administered 2018-05-24: 100 mL via INTRAVENOUS

## 2018-05-24 MED ORDER — INSULIN ASPART 100 UNIT/ML ~~LOC~~ SOLN
0.0000 [IU] | Freq: Three times a day (TID) | SUBCUTANEOUS | Status: DC
  Administered 2018-05-24: 1 [IU] via SUBCUTANEOUS

## 2018-05-24 MED ORDER — INSULIN ASPART 100 UNIT/ML ~~LOC~~ SOLN: 0.0000 [IU] | Freq: Every day | SUBCUTANEOUS | Status: DC

## 2018-05-24 MED ORDER — IOPAMIDOL (ISOVUE-370) INJECTION 76%
INTRAVENOUS | Status: AC
  Filled 2018-05-24: qty 100

## 2018-05-24 MED ORDER — ATORVASTATIN CALCIUM 80 MG PO TABS
80.0000 mg | ORAL_TABLET | Freq: Every day | ORAL | Status: DC
  Administered 2018-05-24: 80 mg via ORAL
  Filled 2018-05-24: qty 1

## 2018-05-24 MED ORDER — LISINOPRIL 2.5 MG PO TABS
2.5000 mg | ORAL_TABLET | Freq: Every day | ORAL | Status: DC
  Administered 2018-05-25: 2.5 mg via ORAL
  Filled 2018-05-24: qty 1

## 2018-05-24 MED ORDER — NYSTATIN 100000 UNIT/GM EX CREA
1.0000 "application " | TOPICAL_CREAM | Freq: Two times a day (BID) | CUTANEOUS | Status: DC | PRN
  Filled 2018-05-24: qty 15

## 2018-05-24 MED ORDER — CLOPIDOGREL BISULFATE 75 MG PO TABS
75.0000 mg | ORAL_TABLET | Freq: Every day | ORAL | Status: DC
  Administered 2018-05-25: 75 mg via ORAL
  Filled 2018-05-24: qty 1

## 2018-05-24 MED ORDER — ASPIRIN 81 MG PO CHEW
324.0000 mg | CHEWABLE_TABLET | Freq: Once | ORAL | Status: AC
  Administered 2018-05-24: 324 mg via ORAL
  Filled 2018-05-24: qty 4

## 2018-05-24 MED ORDER — ENOXAPARIN SODIUM 40 MG/0.4ML ~~LOC~~ SOLN
40.0000 mg | SUBCUTANEOUS | Status: DC
  Administered 2018-05-24: 40 mg via SUBCUTANEOUS
  Filled 2018-05-24 (×2): qty 0.4

## 2018-05-24 MED ORDER — ACETAMINOPHEN 325 MG PO TABS: 650.0000 mg | ORAL_TABLET | ORAL | Status: DC | PRN

## 2018-05-24 NOTE — ED Triage Notes (Signed)
Cp with diff breathing since last night hx of stroke  On plavix

## 2018-05-24 NOTE — ED Notes (Signed)
Patient transported to CT 

## 2018-05-24 NOTE — H&P (Addendum)
History and Physical    James Pugh:[id number] DOB: Feb 18, 1946 DOA: 05/24/2018  PCP: [name], MD Consultants: None Patient coming from: Home- lives with wife  Chief Complaint: Chest pain  HPI: James Pugh is a 72 y.o. male with medical history significant for essential hypertension, type 2 diabetes, non-insulin-dependent, dyslipidemia and status post recent CVA (Feb 2019) who presented to the ED this morning with complaints of chest pain.  He was feeling well until early yesterday afternoon when he was picking up and moving a large pot of soup from the stove.  He states it was much heavier than he is used to lifting and when he picked it up and turned to place it on the counter he felt something pull in his chest.  Later that evening his wife states he was rubbing his chest as if he had a sore muscle.  He was woken up by the pain early this morning.  The pain that woke him up was slightly different than the pain he was having yesterday but it was concerning enough that he came into the ED today.  He states that the pain is substernal and on both sides anteriorly.  It does not radiate to his back to his jaw or arms.  He denies shortness of breath, diaphoresis, lightheadedness, nausea, vomiting.  At rest his pain is about a 1 out of 10.  Exercise and activity do not worsen the pain. Taking deep breaths and certain positions makes the pain worse.  He has had no fever or chills, no abdominal pain, no radiating pain, no cough, no palpitations, no limb pain, no lower extremity edema, no orthopnea. He does not have a personal history of coronary artery disease and has never had a stress test. He does have a FH of CAD. He is a former smoker and quit in 1984.    ED Course: Given ASA 325 mg VSS CTA neg for PE  Review of Systems: As per HPI; otherwise review of systems reviewed and negative.   Ambulatory Status:  Ambulates without assistance  Past Medical History:  Diagnosis Date    . CERUMEN IMPACTION 08/22/2009  . HYPERLIPIDEMIA 06/06/2009  . PREDIABETES 07/13/2010  . Stroke [facility])     Past Surgical History:  Procedure Laterality Date  . TONSILLECTOMY      Social History   Socioeconomic History  . Marital status: Married    Spouse name: Not on file  . Number of children: Not on file  . Years of education: Not on file  . Highest education level: Not on file  Occupational History  . Not on file  Social Needs  . Financial resource strain: Not on file  . Food insecurity:    Worry: Not on file    Inability: Not on file  . Transportation needs:    Medical: Not on file    Non-medical: Not on file  Tobacco Use  . Smoking status: Former Smoker    Packs/day: 1.00    Years: 20.00    Pack years: 20.00    Types: Cigarettes    Last attempt to quit: 08/11/1983    Years since quitting: 34.8  . Smokeless tobacco: Never Used  Substance and Sexual Activity  . Alcohol use: No  . Drug use: No  . Sexual activity: Not on file  Lifestyle  . Physical activity:    Days per week: Not on file    Minutes per session: Not on file  . Stress: Not  on file  Relationships  . Social connections:    Talks on phone: Not on file    Gets together: Not on file    Attends religious service: Not on file    Active member of club or organization: Not on file    Attends meetings of clubs or organizations: Not on file    Relationship status: Not on file  . Intimate partner violence:    Fear of current or ex partner: Not on file    Emotionally abused: Not on file    Physically abused: Not on file    Forced sexual activity: Not on file  Other Topics Concern  . Not on file  Social History Narrative  . Not on file    No Known Allergies  Family History  Problem Relation Age of Onset  . Stroke Father   . Heart disease Father   . Diabetes Father        type ll  . Alcohol abuse Other   . Stroke Brother     Prior to Admission medications   Medication Sig Start Date End  Date Taking? Authorizing Provider  atorvastatin (LIPITOR) 80 MG tablet TAKE 1 TABLET BY MOUTH EVERY DAY AT 6PM Patient taking differently: Take 80 mg by mouth daily at 6 PM.  05/16/18  Yes Burchette, Alinda Sierras, MD  clopidogrel (PLAVIX) 75 MG tablet Take 1 tablet (75 mg total) by mouth daily. 12/19/17  Yes Burchette, Alinda Sierras, MD  lisinopril (PRINIVIL,ZESTRIL) 2.5 MG tablet Take 1 tablet (2.5 mg total) by mouth daily. 12/19/17  Yes Burchette, Alinda Sierras, MD  nystatin cream (MYCOSTATIN) Apply 1 application topically 2 (two) times daily as needed for dry skin. 05/22/18  Yes [name], MD    Physical Exam: Vitals:   05/24/18 1000 05/24/18 1015 05/24/18 1030 05/24/18 1045  BP: (!) 141/59 (!) 141/72 124/69 130/67  Pulse: 93 93 85 84  Resp: 19 18 20  (!) 21  Temp:      TempSrc:      SpO2: 95% 94% 96% 95%  Weight:      Height:         . General:  Appears calm and comfortable, younger than stated age and is in NAD . Eyes:  PERRL, EOMI, normal lids, iris . ENT:  grossly normal hearing, lips & tongue, mmm; appropriate dentition . Neck:  no LAD, masses or thyromegaly; no carotid bruits . Cardiovascular:  normal rate, reg rhythm, no murmur. Marland Kitchen Respiratory:   CTA bilaterally with no wheezes/rales/rhonchi.  Normal respiratory effort. . Abdomen:  soft, NT, ND, NABS . Back:   grossly normal alignment, no CVAT . Skin:  no rash or induration seen on limited exam . Musculoskeletal:  grossly normal tone BUE/BLE, good ROM, no bony abnormality or obvious joint deformity . Lower extremity:  No LE edema.  Limited foot exam with no ulcerations.  2+ distal pulses. Marland Kitchen Psychiatric:  grossly normal mood and affect, speech fluent and appropriate, AOx3 . Neurologic:  CN 2-12 grossly intact, moves all extremities in coordinated fashion, sensation intact   Radiological Exams on Admission: Dg Chest 2 View  Result Date: 05/24/2018 CLINICAL DATA:  Onset chest pain this morning. EXAM: CHEST - 2 VIEW COMPARISON:  PA and  lateral chest 11/11/2014, 12/24/2011 and 07/05/2008. FINDINGS: Nodular focus of increased density projecting over the posterior arc of the right sixth rib is unchanged. The lungs are clear. Heart size is normal. No pneumothorax or pleural effusion. Aortic atherosclerosis is noted. No  acute or focal bony abnormality. IMPRESSION: No acute disease. Atherosclerosis. Electronically Signed   By: Inge Rise M.D.   On: 05/24/2018 09:12   Ct Angio Chest Pe W And/or Wo Contrast  Result Date: 05/24/2018 CLINICAL DATA:  Chest pain with difficulty breathing since last night. Shortness of breath. History of CVA. EXAM: CT ANGIOGRAPHY CHEST WITH CONTRAST TECHNIQUE: Multidetector CT imaging of the chest was performed using the standard protocol during bolus administration of intravenous contrast. Multiplanar CT image reconstructions and MIPs were obtained to evaluate the vascular anatomy. CONTRAST:  166mL ISOVUE-370 IOPAMIDOL (ISOVUE-370) INJECTION 76% COMPARISON:  Chest radiographs today and 11/11/2014. Neck MRA 10/22/2017. FINDINGS: Cardiovascular: The pulmonary arteries are well opacified with contrast to the level of the subsegmental branches. There is no evidence of acute pulmonary embolism. There is atherosclerosis of the aorta, great vessels and coronary arteries. Aortic calcification is suboptimal, although no acute findings are identified. The heart size is normal. There is trace pericardial thickening versus fluid. Mediastinum/Nodes: There are no enlarged mediastinal, hilar or axillary lymph nodes.There is significant paratracheal nodularity at the level of the thoracic inlet, including partially calcified components measuring 2.7 x 5.1 cm on image 26/5 and 2.7 x 2.0 cm on image 35/5. Although separate from the thyroid gland, these are probably exophytic substernal thyroid nodules. The left thyroid lobe also demonstrates mild nodularity, measuring up to 2.9 x 1.9 cm on image 12/5. These findings appear grossly  stable from the previous neck MRI. There is mild mass effect on the trachea which is displaced posteriorly to the right. The esophagus appears normal. Lungs/Pleura: There is no pleural effusion. There are pleural calcifications anteriorly in the right hemithorax. There is mild centrilobular emphysema with superimposed dependent atelectasis at both lung bases. No confluent airspace opacity or suspicious pulmonary nodule. Upper abdomen:  The visualized upper abdomen appears unremarkable. Musculoskeletal/Chest wall: There is no chest wall mass or suspicious osseous finding. 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 Review of the MIP images confirms the above findings. IMPRESSION: 1. No evidence of acute pulmonary embolism or other acute chest process. 2. Coronary and Aortic Atherosclerosis (ICD10-I70.0). 3. Emphysema (ICD10-J43.9). Mild dependent atelectasis at both lung bases. 4. Probable multinodular goiter with exophytic nodules extending into the substernal space, with mild mass effect on the trachea. Similar appearance to previous MRI from 7 months ago. Consider nonemergent ultrasound evaluation. Electronically Signed   By: Richardean Sale M.D.   On: 05/24/2018 11:41    EKG: Independently reviewed.  NSR with rate Early repolarization Normal ECG; nonspecific ST changes with no evidence of acute ischemia  Labs on Admission: I have personally reviewed the available labs and imaging studies at the time of the admission.  Pertinent labs:  Sodium 136 potassium 4.5 chloride 101 CO2 26 glucose 123 BUN 11 creatinine 0.95 LFTs within normal limits Troponin 0.03 White blood cells 11.0, hemoglobin 13.2, platelets 284 Lipid panel March 2019: Cholesterol 130, HDL 31.5, LDL 80, triglycerides 91  Echocardiogram October 23, 2017: EF 55 to 60%.  Normal wall motion, normal diastolic function, normal valves  Assessment/Plan Principal Problem:   Atypical chest pain Active  Problems:   Dyslipidemia   CVA (cerebral vascular accident) ([place])   Diabetes mellitus type 2 in nonobese (HCC)   Benign essential HTN   OSA (obstructive sleep apnea)   Goiter  Chest pain: By history this is most likely musculoskeletal pain but given his risk factors (HTN, HLD, DM, CVA) will admit to obs for ACS  r/o and risk stratification. CTA neg for PE.  -admit to obs, tele -cycle troponins -will obtain exercise stress test tomorrow -EKG in a.m. -cont plavix -NPO after MN -heart healthy, carb controlled diet  Recent CVA, doing very well, without residual -cont plavix 75 mg daily   DM2, noninsulin-dependent -pt is on no meds; last HbA1C was 6.4 on 11/08/2017 -will check HbA1C   Essential HTN -cont home lisinopril 2.5 mg daily  Hyperlipidemia -cont atorvastatin 80 mg -check lipid panel in a.m.  OSA -cont Bipap, home settings  Multinodular goiter incidentally seen on CTA today, no significant change since MRI in Feb. -pt was unaware he had this (MRI report from Feb did not comment on it) -nonemergent thyroid ultrasound ordered.  -check TSH    DVT prophylaxis:  Lovenox or SCDs Code Status:  Full - confirmed with patient/family Family Communication: wife at bedside Disposition Plan:  Home once clinically improved Consults called:   Admission status: It is my clinical opinion that referral for OBSERVATION is reasonable and necessary in this patient based on the above information provided. The aforementioned taken together are felt to place the patient at high risk for further clinical deterioration. However it is anticipated that the patient may be medically stable for discharge from the hospital within 24 to 48 hours.     Janora Norlander MD [facility]  If note is complete, please contact covering daytime or nighttime physician. [email or web address] Password [email or web address]  05/24/2018, 12:12 PM

## 2018-05-24 NOTE — Progress Notes (Signed)
  Patient refusing BiPAP at this time.  Patient stable RR 20 resting comfortable.  RT will continue to monitor.

## 2018-05-24 NOTE — Progress Notes (Signed)
PT arrived from ED via wheelchair. Telebox 12 applied. CCMD notified. Vitals stable. CHG bath given. Pt denies any complaints at this time. Pt family escorted to room. Dinner ordered. Jerald Kief

## 2018-05-24 NOTE — ED Provider Notes (Signed)
[facility]ERGENC[facility] DEPARTMENT Provider Note   CSN: [id number] Arrival date & time: 05/24/18  8921     History   Chief Complaint Chief Complaint  Patient presents with  . Chest Pain    HPI James Pugh is a 72 y.o. male.  The history is provided by the patient.  Chest Pain   This is a new problem. The current episode started yesterday. The problem occurs constantly. The problem has been gradually improving. The pain is associated with rest and breathing. The pain is present in the substernal region. The pain is at a severity of 2/10. The pain is mild. The quality of the pain is described as pressure-like. The pain does not radiate. The symptoms are aggravated by deep breathing. Associated symptoms include shortness of breath. Pertinent negatives include no abdominal pain, no back pain, no cough, no dizziness, no exertional chest pressure, no fever, no irregular heartbeat, no leg pain, no lower extremity edema, no nausea, no palpitations, no syncope and no vomiting. Risk factors include being elderly.  His past medical history is significant for hyperlipidemia, hypertension and strokes.  Pertinent negatives for past medical history include no CAD and no seizures.  His family medical history is significant for CAD.  Procedure history is negative for echocardiogram.    Past Medical History:  Diagnosis Date  . CERUMEN IMPACTION 08/22/2009  . HYPERLIPIDEMIA 06/06/2009  . PREDIABETES 07/13/2010  . Stroke [facility])     Patient Active Problem List   Diagnosis Date Noted  . Acute blood loss anemia   . Hypoalbuminemia due to protein-calorie malnutrition ([place])   . Reactive hypertension   . 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 Acute CVA (cerebrovascular accident) ([place])   . Dysarthria, post-stroke   . Hyperlipidemia   . Diabetes mellitus type 2 in nonobese ([facility])   . Benign essential HTN   . Status post cataract extraction   . OSA (obstructive  sleep apnea)   . CVA (cerebral vascular accident) ([place]) 10/22/2017  . CERUMEN IMPACTION 08/22/2009  . Dyslipidemia 06/06/2009    Past Surgical History:  Procedure Laterality Date  . TONSILLECTOMY          Home Medications    Prior to Admission medications   Medication Sig Start Date End Date Taking? Authorizing Provider  atorvastatin (LIPITOR) 80 MG tablet TAKE 1 TABLET BY MOUTH EVERY DAY AT 6PM Patient taking differently: Take 80 mg by mouth daily at 6 PM.  05/16/18  Yes Burchette, Alinda Sierras, MD  clopidogrel (PLAVIX) 75 MG tablet Take 1 tablet (75 mg total) by mouth daily. 12/19/17  Yes Burchette, Alinda Sierras, MD  lisinopril (PRINIVIL,ZESTRIL) 2.5 MG tablet Take 1 tablet (2.5 mg total) by mouth daily. 12/19/17  Yes Burchette, Alinda Sierras, MD  nystatin cream (MYCOSTATIN) Apply 1 application topically 2 (two) times daily as needed for dry skin. 05/22/18  Yes Burchette, Alinda Sierras, MD    Family History Family History  Problem Relation Age of Onset  . Stroke Father   . Heart disease Father   . Diabetes Father        type ll  . Alcohol abuse Other   . Stroke Brother     Social History Social History   Tobacco Use  . Smoking status: Former Smoker    Packs/day: 1.00    Years: 20.00    Pack years: 20.00    Types: Cigarettes    Last attempt to quit: 08/11/1983    Years since quitting:  34.8  . Smokeless tobacco: Never Used  Substance Use Topics  . Alcohol use: No  . Drug use: No     Allergies   Patient has no known allergies.   Review of Systems Review of Systems  Constitutional: Negative for chills and fever.  HENT: Negative for ear pain and sore throat.   Eyes: Negative for pain and visual disturbance.  Respiratory: Positive for shortness of breath. Negative for cough.   Cardiovascular: Positive for chest pain. Negative for palpitations and syncope.  Gastrointestinal: Negative for abdominal pain, nausea and vomiting.  Genitourinary: Negative for dysuria and hematuria.    Musculoskeletal: Negative for arthralgias and back pain.  Skin: Negative for color change and rash.  Neurological: Negative for dizziness, seizures and syncope.  All other systems reviewed and are negative.    Physical Exam Updated Vital Signs  ED Triage Vitals  Enc Vitals Group     BP 05/24/18 0814 139/85     Pulse Rate 05/24/18 0814 85     Resp 05/24/18 0814 16     Temp 05/24/18 0814 99.6 F (37.6 C)     Temp Source 05/24/18 0814 Oral     SpO2 05/24/18 0814 98 %     Weight --      Height --      Head Circumference --      Peak Flow --      Pain Score 05/24/18 0810 4     Pain Loc --      Pain Edu? --      Excl. in [place]? --     Physical Exam  Constitutional: He appears well-developed and well-nourished.  HENT:  Head: Normocephalic and atraumatic.  Eyes: Pupils are equal, round, and reactive to light. Conjunctivae and EOM are normal.  Neck: Normal range of motion. Neck supple.  Cardiovascular: Normal rate, regular rhythm, intact distal pulses and normal pulses.  No murmur heard. Pulmonary/Chest: Effort normal and breath sounds normal. No respiratory distress. He has no decreased breath sounds. He has no wheezes.  Abdominal: Soft. There is no tenderness.  Musculoskeletal: He exhibits no edema.       Right lower leg: Normal. He exhibits no edema.       Left lower leg: Normal. He exhibits no edema.  Neurological: He is alert.  Skin: Skin is warm and dry. Capillary refill takes less than 2 seconds.  Psychiatric: He has a normal mood and affect.  Nursing note and vitals reviewed.    ED Treatments / Results  Labs (all labs ordered are listed, but only abnormal results are displayed) Labs Reviewed  COMPREHENSIVE METABOLIC PANEL - Abnormal; Notable for the following components:      Result Value   Glucose, Bld 123 (*)    All other components within normal limits  CBC WITH DIFFERENTIAL/PLATELET - Abnormal; Notable for the following components:   WBC 11.0 (*)    Neutro  Abs 8.2 (*)    All other components within normal limits  I-STAT TROPONIN, ED    EKG EKG Interpretation  Date/Time:  Wednesday May 24 2018 08:13:41 EDT Ventricular Rate:  86 PR Interval:  164 QRS Duration: 78 QT Interval:  346 QTC Calculation: 414 R Axis:   77 Text Interpretation:  Normal sinus rhythm Early repolarization Normal ECG Confirmed by Lennice Sites [phone]) on 05/24/2018 8:56:39 AM   Radiology Dg Chest 2 View  Result Date: 05/24/2018 CLINICAL DATA:  Onset chest pain this morning. EXAM: CHEST - 2 VIEW COMPARISON:  PA and lateral chest 11/11/2014, 12/24/2011 and 07/05/2008. FINDINGS: Nodular focus of increased density projecting over the posterior arc of the right sixth rib is unchanged. The lungs are clear. Heart size is normal. No pneumothorax or pleural effusion. Aortic atherosclerosis is noted. No acute or focal bony abnormality. IMPRESSION: No acute disease. Atherosclerosis. Electronically Signed   By: Inge Rise M.D.   On: 05/24/2018 09:12   Ct Angio Chest Pe W And/or Wo Contrast  Result Date: 05/24/2018 CLINICAL DATA:  Chest pain with difficulty breathing since last night. Shortness of breath. History of CVA. EXAM: CT ANGIOGRAPHY CHEST WITH CONTRAST TECHNIQUE: Multidetector CT imaging of the chest was performed using the standard protocol during bolus administration of intravenous contrast. Multiplanar CT image reconstructions and MIPs were obtained to evaluate the vascular anatomy. CONTRAST:  192mL ISOVUE-370 IOPAMIDOL (ISOVUE-370) INJECTION 76% COMPARISON:  Chest radiographs today and 11/11/2014. Neck MRA 10/22/2017. FINDINGS: Cardiovascular: The pulmonary arteries are well opacified with contrast to the level of the subsegmental branches. There is no evidence of acute pulmonary embolism. There is atherosclerosis of the aorta, great vessels and coronary arteries. Aortic calcification is suboptimal, although no acute findings are identified. The heart size is  normal. There is trace pericardial thickening versus fluid. Mediastinum/Nodes: There are no enlarged mediastinal, hilar or axillary lymph nodes.There is significant paratracheal nodularity at the level of the thoracic inlet, including partially calcified components measuring 2.7 x 5.1 cm on image 26/5 and 2.7 x 2.0 cm on image 35/5. Although separate from the thyroid gland, these are probably exophytic substernal thyroid nodules. The left thyroid lobe also demonstrates mild nodularity, measuring up to 2.9 x 1.9 cm on image 12/5. These findings appear grossly stable from the previous neck MRI. There is mild mass effect on the trachea which is displaced posteriorly to the right. The esophagus appears normal. Lungs/Pleura: There is no pleural effusion. There are pleural calcifications anteriorly in the right hemithorax. There is mild centrilobular emphysema with superimposed dependent atelectasis at both lung bases. No confluent airspace opacity or suspicious pulmonary nodule. Upper abdomen:  The visualized upper abdomen appears unremarkable. Musculoskeletal/Chest wall: There is no chest wall mass or suspicious osseous finding. 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 Review of the MIP images confirms the above findings. IMPRESSION: 1. No evidence of acute pulmonary embolism or other acute chest process. 2. Coronary and Aortic Atherosclerosis (ICD10-I70.0). 3. Emphysema (ICD10-J43.9). Mild dependent atelectasis at both lung bases. 4. Probable multinodular goiter with exophytic nodules extending into the substernal space, with mild mass effect on the trachea. Similar appearance to previous MRI from 7 months ago. Consider nonemergent ultrasound evaluation. Electronically Signed   By: Richardean Sale M.D.   On: 05/24/2018 11:41    Procedures Procedures (including critical care time)  Medications Ordered in ED Medications  iopamidol (ISOVUE-370) 76 % injection (has no  administration in time range)  aspirin chewable tablet 324 mg (324 mg Oral Given 05/24/18 0945)  iopamidol (ISOVUE-370) 76 % injection 100 mL (100 mLs Intravenous Contrast Given 05/24/18 1103)     Initial Impression / Assessment and Plan / ED Course  I have reviewed the triage vital signs and the nursing notes.  Pertinent labs & imaging results that were available during my care of the patient were reviewed by me and considered in my medical decision making (see chart for details).     James Pugh is a 72 year old male with history of stroke, high cholesterol, hypertension who presents to the  ED with chest pain.  Patient with normal vitals.  No fever.  Patient states chest pain started last night while at rest.  Has been on and off for the last several hours.  Has very mild chest pain at this time.  He is unsure if it is with exertion.  He denies any cough, sputum production.  No history of PE.  No recent travel. Patient with EKG that shows sinus rhythm.  No signs of ischemic changes.  Initial troponin within normal limits.  Patient with multiple cardiac risk factors and at risk for ACS.  Patient had PE study that showed no acute clot.  Incidental finding of multi-nodule goiter.  Similar to appearance and MRI from 7 months ago and patient will need outpatient ultrasound.  Chest x-ray showed no signs of pneumonia, pneumothorax, pleural effusion.  Patient with no significant anemia, electrolyte abnormality, kidney injury.  Patient given 324 mg aspirin.  He is ready on Plavix for stroke.  Given multiple cardiac risk factors will admit the patient for further cardiac rule out.  Hemodynamically stable to my care.  This chart was dictated using voice recognition software.  Despite best efforts to proofread,  errors can occur which can change the documentation meaning.   Final Clinical Impressions(s) / ED Diagnoses   Final diagnoses:  Chest pain, unspecified type    ED Discharge Orders    None        Lennice Sites, DO 05/24/18 2124

## 2018-05-25 ENCOUNTER — Observation Stay (HOSPITAL_BASED_OUTPATIENT_CLINIC_OR_DEPARTMENT_OTHER): Payer: Medicare Other

## 2018-05-25 DIAGNOSIS — I6302 Cerebral infarction due to thrombosis of basilar artery: Secondary | ICD-10-CM

## 2018-05-25 DIAGNOSIS — R079 Chest pain, unspecified: Secondary | ICD-10-CM | POA: Diagnosis not present

## 2018-05-25 DIAGNOSIS — E049 Nontoxic goiter, unspecified: Secondary | ICD-10-CM

## 2018-05-25 DIAGNOSIS — R0789 Other chest pain: Secondary | ICD-10-CM

## 2018-05-25 DIAGNOSIS — E119 Type 2 diabetes mellitus without complications: Secondary | ICD-10-CM

## 2018-05-25 DIAGNOSIS — I10 Essential (primary) hypertension: Secondary | ICD-10-CM | POA: Diagnosis not present

## 2018-05-25 DIAGNOSIS — E785 Hyperlipidemia, unspecified: Secondary | ICD-10-CM | POA: Diagnosis not present

## 2018-05-25 DIAGNOSIS — G4733 Obstructive sleep apnea (adult) (pediatric): Secondary | ICD-10-CM

## 2018-05-25 LAB — EXERCISE TOLERANCE TEST
Estimated workload: 8.1 METS
Exercise duration (min): 6 min
Exercise duration (sec): 23 s
MPHR: 147 {beats}/min
Peak HR: 153 {beats}/min
Percent HR: 104 %
Rest HR: 82 {beats}/min

## 2018-05-25 LAB — LIPID PANEL
Cholesterol: 110 mg/dL (ref 0–200)
HDL: 38 mg/dL — ABNORMAL LOW (ref >40)
LDL Cholesterol: 63 mg/dL (ref 0–99)
Total CHOL/HDL Ratio: 2.9 RATIO
Triglycerides: 47 mg/dL (ref <150)
VLDL: 9 mg/dL (ref 0–40)

## 2018-05-25 LAB — GLUCOSE, CAPILLARY
Glucose-Capillary: 114 mg/dL — ABNORMAL HIGH (ref 70–99)
Glucose-Capillary: 123 mg/dL — ABNORMAL HIGH (ref 70–99)

## 2018-05-25 LAB — HEMOGLOBIN A1C
Hgb A1c MFr Bld: 6.6 % — ABNORMAL HIGH (ref 4.8–5.6)
Mean Plasma Glucose: 142.72 mg/dL

## 2018-05-25 NOTE — Progress Notes (Signed)
I asked Dr. Meda Coffee to review EKG prior to plan for ETT with elevated ST, she stated it was early repol - from EKG standpoint ok to proceed with POET.

## 2018-05-25 NOTE — Discharge Summary (Signed)
Physician Discharge Summary  James Pugh PZW:[id number] DOB: 1945-11-27 DOA: 05/24/2018  PCP: [name], MD  Admit date: 05/24/2018 Discharge date: 05/25/2018  Time spent: 35 minutes  Recommendations for Outpatient Follow-up:  1. Close follow up to CBG'A1C and initiate hypoglycemic regimen as needed (wife expressed patient's diet is poor and he is not looking to follow lifestyle changes yet) 2. Follow up recommendations for FNA aspiration of thyroid goiter and repeat thyroid profile 3. Family interested in event monitoring.    Discharge Diagnoses:  Principal Problem:   Atypical chest pain Active Problems:   Dyslipidemia   CVA (cerebral vascular accident) ([place])   Diabetes mellitus type 2 in nonobese ([place])   Benign essential HTN   OSA (obstructive sleep apnea)   Goiter   Discharge Condition: stable and improved. Discharge home with instructions to follow up with PCP in 2 weeks.   Diet recommendation: heart healthy and modified carbohydrates diet   Filed Weights   05/24/18 0859  Weight: 70.3 kg    History of present illness:  As per H&P written by Dr. Steffanie Dunn on 05/24/18 72 y.o. male with medical history significant for essential hypertension, type 2 diabetes, non-insulin-dependent, dyslipidemia and status post recent CVA (Feb 2019) who presented to the ED this morning with complaints of chest pain.  He was feeling well until early yesterday afternoon when he was picking up and moving a large pot of soup from the stove.  He states it was much heavier than he is used to lifting and when he picked it up and turned to place it on the counter he felt something pull in his chest.  Later that evening his wife states he was rubbing his chest as if he had a sore muscle.  He was woken up by the pain early this morning.  The pain that woke him up was slightly different than the pain he was having yesterday but it was concerning enough that he came into the ED today.  He states that  the pain is substernal and on both sides anteriorly.  It does not radiate to his back to his jaw or arms.  He denies shortness of breath, diaphoresis, lightheadedness, nausea, vomiting.  At rest his pain is about a 1 out of 10.  Exercise and activity do not worsen the pain. Taking deep breaths and certain positions makes the pain worse.  He has had no fever or chills, no abdominal pain, no radiating pain, no cough, no palpitations, no limb pain, no lower extremity edema, no orthopnea. He does not have a personal history of coronary artery disease and has never had a stress test. He does have a FH of CAD. He is a former smoker and quit in 1984.   Hospital Course:  1-CP: with heart score of 4 -patient's troponin neg X3 -CP free and denying any angina equivalent symptoms -telemetry w/o acute ischemic changes amd EKG showing earfly repolarization. -exercise stress test neg  -continue plavix, lisinopril and statins  2-hx of CVA -no residual deficit and no new neurologic changes -continue risk factors modifications -continue plavix for secondary prevention  3-type 2 diabetes mellitus: with hx of stroke -A1C 6.6 -would like to discussed with PCP before starting any hypoglycemic management -patient encourage to follow lifestyle changes and modified carb diet.  4-essential HTN -continue lisinopril  5-dyslipidemia -continue statins -LDL at goal.  6-OSA -continue CPAP at bedtime   7-multinodular goiter -asymptomatic -TSH just mildly decrease -per radiology recommendations and base on thyroid  US:  1. Approximately 4.5 cm TI-RADS category 4 nodule (labeled # 2) in the left mid and lower gland meets criteria for consideration of fine-needle aspiration biopsy. 2. Approximately 1.8 cm TI-RADS category 3 nodule (labeled # 1) in the right inferior gland meets criteria for follow-up ultrasound in 1 year.  Procedures:  See below for x-ray reports   Consultations:  Cardiology to read stress  test.   Discharge Exam: Vitals:   05/25/18 0842 05/25/18 1119  BP: 127/66 125/71  Pulse: 80   Resp: 18   Temp: 98.3 F (36.8 C)   SpO2: 96%     General: afebrile, in no acute distress; patient reported no nausea, no vomiting and denying CP or SOB. Cardiovascular: S1 and S2, no rubs, no gallops, no murmurs, RRR Respiratory: CTA bilaterally Abd: soft, NT, ND, positive BS Extremities: no edema, no cyanosis   Discharge Instructions   Discharge Instructions    Diet - low sodium heart healthy   Complete by:  As directed    Diet Carb Modified   Complete by:  As directed    Discharge instructions   Complete by:  As directed    Follow heart healthy and modified carbohydrates diet  Keep yourself well hydrated  Follow up with PCP in 2 weeks     Allergies as of 05/25/2018   No Known Allergies     Medication List    TAKE these medications   atorvastatin 80 MG tablet Commonly known as:  LIPITOR TAKE 1 TABLET BY MOUTH EVERY DAY AT 6PM What changed:  See the new instructions.   clopidogrel 75 MG tablet Commonly known as:  PLAVIX Take 1 tablet (75 mg total) by mouth daily.   lisinopril 2.5 MG tablet Commonly known as:  PRINIVIL,ZESTRIL Take 1 tablet (2.5 mg total) by mouth daily.   nystatin cream Commonly known as:  MYCOSTATIN Apply 1 application topically 2 (two) times daily as needed for dry skin.      No Known Allergies Follow-up Information    [name], MD. Schedule an appointment as soon as possible for a visit in 2 week(s).   Specialty:  Family Medicine Contact information: [place] [address] [phone]           The results of significant diagnostics from this hospitalization (including imaging, microbiology, ancillary and laboratory) are listed below for reference.    Significant Diagnostic Studies: Dg Chest 2 View  Result Date: 05/24/2018 CLINICAL DATA:  Onset chest pain this morning. EXAM: CHEST - 2 VIEW  COMPARISON:  PA and lateral chest 11/11/2014, 12/24/2011 and 07/05/2008. FINDINGS: Nodular focus of increased density projecting over the posterior arc of the right sixth rib is unchanged. The lungs are clear. Heart size is normal. No pneumothorax or pleural effusion. Aortic atherosclerosis is noted. No acute or focal bony abnormality. IMPRESSION: No acute disease. Atherosclerosis. Electronically Signed   By: Inge Rise M.D.   On: 05/24/2018 09:12   Ct Angio Chest Pe W And/or Wo Contrast  Result Date: 05/24/2018 CLINICAL DATA:  Chest pain with difficulty breathing since last night. Shortness of breath. History of CVA. EXAM: CT ANGIOGRAPHY CHEST WITH CONTRAST TECHNIQUE: Multidetector CT imaging of the chest was performed using the standard protocol during bolus administration of intravenous contrast. Multiplanar CT image reconstructions and MIPs were obtained to evaluate the vascular anatomy. CONTRAST:  127mL ISOVUE-370 IOPAMIDOL (ISOVUE-370) INJECTION 76% COMPARISON:  Chest radiographs today and 11/11/2014. Neck MRA 10/22/2017. FINDINGS: Cardiovascular: The pulmonary arteries  are well opacified with contrast to the level of the subsegmental branches. There is no evidence of acute pulmonary embolism. There is atherosclerosis of the aorta, great vessels and coronary arteries. Aortic calcification is suboptimal, although no acute findings are identified. The heart size is normal. There is trace pericardial thickening versus fluid. Mediastinum/Nodes: There are no enlarged mediastinal, hilar or axillary lymph nodes.There is significant paratracheal nodularity at the level of the thoracic inlet, including partially calcified components measuring 2.7 x 5.1 cm on image 26/5 and 2.7 x 2.0 cm on image 35/5. Although separate from the thyroid gland, these are probably exophytic substernal thyroid nodules. The left thyroid lobe also demonstrates mild nodularity, measuring up to 2.9 x 1.9 cm on image 12/5. These  findings appear grossly stable from the previous neck MRI. There is mild mass effect on the trachea which is displaced posteriorly to the right. The esophagus appears normal. Lungs/Pleura: There is no pleural effusion. There are pleural calcifications anteriorly in the right hemithorax. There is mild centrilobular emphysema with superimposed dependent atelectasis at both lung bases. No confluent airspace opacity or suspicious pulmonary nodule. Upper abdomen:  The visualized upper abdomen appears unremarkable. Musculoskeletal/Chest wall: There is no chest wall mass or suspicious osseous finding. 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 Review of the MIP images confirms the above findings. IMPRESSION: 1. No evidence of acute pulmonary embolism or other acute chest process. 2. Coronary and Aortic Atherosclerosis (ICD10-I70.0). 3. Emphysema (ICD10-J43.9). Mild dependent atelectasis at both lung bases. 4. Probable multinodular goiter with exophytic nodules extending into the substernal space, with mild mass effect on the trachea. Similar appearance to previous MRI from 7 months ago. Consider nonemergent ultrasound evaluation. Electronically Signed   By: Richardean Sale M.D.   On: 05/24/2018 11:41   US Thyroid  Result Date: 05/24/2018 CLINICAL DATA:  Goiter. EXAM: THYROID ULTRASOUND TECHNIQUE: Ultrasound examination of the thyroid gland and adjacent soft tissues was performed. COMPARISON:  None. FINDINGS: Parenchymal Echotexture: Moderately heterogenous Isthmus: 0.5 cm Right lobe: 6.1 x 2.3 x 1.8 cm Left lobe: 6.4 x 2.6 x 2.8 cm _________________________________________________________ Estimated total number of nodules >/= 1 cm: 2 Number of spongiform nodules >/=  2 cm not described below (TR1): 0 Number of mixed cystic and solid nodules >/= 1.5 cm not described below ([place]): 0 _________________________________________________________ Nodule # 1: Location: Right; Inferior  Maximum size: 1.8 cm; Other 2 dimensions: 1.0 x 1.6 cm Composition: solid/almost completely solid (2) Echogenicity: isoechoic (1) Shape: not taller-than-wide (0) Margins: ill-defined (0) Echogenic foci: none (0) ACR TI-RADS total points: 3. ACR TI-RADS risk category: TR3 (3 points). ACR TI-RADS recommendations: *Given size (>/= 1.5 - 2.4 cm) and appearance, a follow-up ultrasound in 1 year should be considered based on TI-RADS criteria. _________________________________________________________ Nodule # 2: Location: Left; Mid Maximum size: 4.5 cm; Other 2 dimensions: 2.5 x 3.2 cm Composition: solid/almost completely solid (2) Echogenicity: hypoechoic (2) Shape: not taller-than-wide (0) Margins: smooth (0) Echogenic foci: macrocalcifications (1) ACR TI-RADS total points: 5. ACR TI-RADS risk category: TR4 (4-6 points). ACR TI-RADS recommendations: **Given size (>/= 1.5 cm) and appearance, fine needle aspiration of this moderately suspicious nodule should be considered based on TI-RADS criteria. _________________________________________________________ IMPRESSION: 1. Approximately 4.5 cm TI-RADS category 4 nodule (labeled # 2) in the left mid and lower gland meets criteria for consideration of fine-needle aspiration biopsy. 2. Approximately 1.8 cm TI-RADS category 3 nodule (labeled # 1) in the right inferior gland meets criteria for follow-up ultrasound in  1 year. The above is in keeping with the ACR TI-RADS recommendations - J Am Coll Radiol 2017;14:587-595. Electronically Signed   By: Jacqulynn Cadet M.D.   On: 05/24/2018 15:44   Labs: Basic Metabolic Panel: Recent Labs  Lab 05/24/18 0811  NA 136  K 4.5  CL 101  CO2 26  GLUCOSE 123*  BUN 11  CREATININE 0.95  CALCIUM 9.6   Liver Function Tests: Recent Labs  Lab 05/24/18 0811  AST 28  ALT 41  ALKPHOS 52  BILITOT 0.6  PROT 6.7  ALBUMIN 3.7   CBC: Recent Labs  Lab 05/24/18 0811  WBC 11.0*  NEUTROABS 8.2*  HGB 13.2  HCT 40.4  MCV 91.2   PLT 284   Cardiac Enzymes: Recent Labs  Lab 05/24/18 1624  TROPONINI <0.03    CBG: Recent Labs  Lab 05/24/18 1659 05/24/18 2055 05/25/18 0614 05/25/18 1115  GLUCAP 123* 133* 114* 123*    Signed:  Barton Dubois MD.  [facility] 05/25/2018, 12:32 PM

## 2018-05-25 NOTE — Progress Notes (Addendum)
   [facility] was asked to proctor stress test on this patient. He reports h/o stroke and OSA, denies HTN/HLD/DM outlined in IM note. + FHx in father in his 6s, details unclear. Former smoker, no tobacco, no ETOH. Day before yestreday he lifted a heavy pot of soup from the stove and felt something pull in his chest. Yesterday AM awoke with vague chest soreness without associated symptoms, worse taking a deep breath and lying down, improved sitting up. Not worse with exertion or palpation. He was given ASA without significant change but symptoms gradually eased off to resolution without other intervention. Troponins neg x 2.  Baseline tracing showed diffuse ST elevation. Dr. Meda Coffee (DOD) reviewed and felt likely represents either early repolarization or pericarditis. These changes improved with exercise. He was able to exercise 6:23 without angina. He did reach target HR. He requested to stop because of hoarseness/dry mouth which he related to being NPO. Nonspecific ST-T changes were noted with exercise but Dr. Meda Coffee reviewed, she feels test is diagnostic and negative for ischemia. Cardiology is not formally involved in this patient's plan for care so further management will be at the discretion of primary team.  Charlie Pitter, PA-C 05/25/2018, 10:48 AM

## 2018-05-25 NOTE — Progress Notes (Signed)
Patient and wife given discharge instructions, all questions answered. PIV x1 removed, patient dressed himself. All belongings sent with patient. Patient ambulated to personal vehicle with wife at time of discharge, refused wheelchair.

## 2018-05-26 ENCOUNTER — Telehealth: Payer: Self-pay | Admitting: *Deleted

## 2018-05-26 NOTE — Telephone Encounter (Signed)
Patient not available at time of TCM call and requests call back on Monday.

## 2018-05-29 ENCOUNTER — Other Ambulatory Visit (INDEPENDENT_AMBULATORY_CARE_PROVIDER_SITE_OTHER): Payer: Medicare Other

## 2018-05-29 ENCOUNTER — Encounter: Payer: Self-pay | Admitting: Family Medicine

## 2018-05-29 ENCOUNTER — Ambulatory Visit (INDEPENDENT_AMBULATORY_CARE_PROVIDER_SITE_OTHER): Payer: Medicare Other | Admitting: Family Medicine

## 2018-05-29 VITALS — BP 122/60 | HR 55 | Temp 98.0°F | Ht 66.0 in | Wt 151.0 lb

## 2018-05-29 DIAGNOSIS — E785 Hyperlipidemia, unspecified: Secondary | ICD-10-CM

## 2018-05-29 DIAGNOSIS — I6381 Other cerebral infarction due to occlusion or stenosis of small artery: Secondary | ICD-10-CM

## 2018-05-29 DIAGNOSIS — E119 Type 2 diabetes mellitus without complications: Secondary | ICD-10-CM

## 2018-05-29 DIAGNOSIS — L989 Disorder of the skin and subcutaneous tissue, unspecified: Secondary | ICD-10-CM

## 2018-05-29 LAB — LIPID PANEL
CHOL/HDL RATIO: 4
CHOLESTEROL: 125 mg/dL (ref 0–200)
HDL: 32.5 mg/dL — AB (ref >39.00)
LDL CALC: 79 mg/dL (ref 0–99)
NonHDL: 92.48
TRIGLYCERIDES: 69 mg/dL (ref 0.0–149.0)
VLDL: 13.8 mg/dL (ref 0.0–40.0)

## 2018-05-29 LAB — HEMOGLOBIN A1C: Hgb A1c MFr Bld: 6.6 % — ABNORMAL HIGH (ref 4.6–6.5)

## 2018-05-29 MED ORDER — ACYCLOVIR 400 MG PO TABS: 400.0000 mg | ORAL_TABLET | Freq: Three times a day (TID) | ORAL | 0 refills | Status: DC

## 2018-05-29 NOTE — Progress Notes (Signed)
HPI:  Using dictation device. Unfortunately this device frequently misinterprets words/phrases.  James Pugh is a pleasant 72 yo here for an acute visit for a lesion above his lip: -started acutely 2-3 days ago -reports initially looked like a bump, then blisters, then crusty -painful -hx of cold sores -uses CPAP -no lesions elsewhere, malaise, fevers -started applying abx ointment and a little better today -in hospital recently for CP per his report, but thought to be muscular, reports has follow up with PCP in 1 week  ROS: See pertinent positives and negatives per HPI.  Past Medical History:  Diagnosis Date  . CERUMEN IMPACTION 08/22/2009  . HYPERLIPIDEMIA 06/06/2009  . PREDIABETES 07/13/2010  . Stroke [facility])     Past Surgical History:  Procedure Laterality Date  . TONSILLECTOMY      Family History  Problem Relation Age of Onset  . Stroke Father   . Heart disease Father   . Diabetes Father        type ll  . Alcohol abuse Other   . Stroke Brother     SOCIAL HX: see hpi   Current Outpatient Medications:  .  atorvastatin (LIPITOR) 80 MG tablet, TAKE 1 TABLET BY MOUTH EVERY DAY AT 6PM (Patient taking differently: Take 80 mg by mouth daily at 6 PM. ), Disp: 90 tablet, Rfl: 1 .  clopidogrel (PLAVIX) 75 MG tablet, Take 1 tablet (75 mg total) by mouth daily., Disp: 90 tablet, Rfl: 1 .  lisinopril (PRINIVIL,ZESTRIL) 2.5 MG tablet, Take 1 tablet (2.5 mg total) by mouth daily., Disp: 90 tablet, Rfl: 1 .  nystatin cream (MYCOSTATIN), Apply 1 application topically 2 (two) times daily as needed for dry skin., Disp: 30 g, Rfl: 1 .  acyclovir (ZOVIRAX) 400 MG tablet, Take 1 tablet (400 mg total) by mouth 3 (three) times daily., Disp: 15 tablet, Rfl: 0  EXAM:  Vitals:   05/29/18 0755  BP: 122/60  Pulse: (!) 55  Temp: 98 F (36.7 C)    Body mass index is 24.37 kg/m.  GENERAL: vitals reviewed and listed above, alert, oriented, appears well hydrated and in no acute  distress  HEENT: atraumatic, conjunttiva clear, no obvious abnormalities on inspection of external nose and ears  NECK: no obvious masses on inspection  LUNGS: clear to auscultation bilaterally, no wheezes, rales or rhonchi, good air movement  CV: HRRR, no peripheral edema  SKIN: small ~ 0.8 cm area of erythema and thin crust just above R upper lip, no fluctuance, no blistering, no edema, no other lesions  MS: moves all extremities without noticeable abnormality  PSYCH: pleasant and cooperative, no obvious depression or anxiety  ASSESSMENT AND PLAN:  Discussed the following assessment and plan:  Lesion of skin of face  -we discussed possible serious and likely etiologies, workup and treatment, treatment risks and return precautions - query herpes simplex vs bacterial skin infection (possible shingles, but less likely given no other lesions) vs other -after this discussion, James Pugh opted for starting acyclovir oral and using the mupirocin (he has at home) twice daily, cover when using CPAP, otherwise leave uncovered as able -follow up advised as scheduled and recheck then unless worsening or new concerns -of course, we advised James Pugh  to return or notify a doctor immediately if symptoms worsen or persist or new concerns arise.   Patient Instructions  Keep follow up as scheduled.  Take the Acyclovir as prescribed starting as soon as possible today.  Continue the mupirocin ointment twice daily for 5-7  days.  I hope you are feeling better soon! Seek care promptly if your symptoms worsen, new concerns arise or you are not improving with treatment.     Lucretia Kern, DO

## 2018-05-29 NOTE — Patient Instructions (Addendum)
Keep follow up as scheduled.  Take the Acyclovir as prescribed starting as soon as possible today.  Continue the mupirocin ointment twice daily for 5-7 days.  I hope you are feeling better soon! Seek care promptly if your symptoms worsen, new concerns arise or you are not improving with treatment.

## 2018-05-30 ENCOUNTER — Encounter: Payer: Self-pay | Admitting: Family Medicine

## 2018-06-01 ENCOUNTER — Telehealth: Payer: Self-pay | Admitting: Licensed Clinical Social Worker

## 2018-06-01 ENCOUNTER — Other Ambulatory Visit: Payer: Medicare Other | Admitting: Licensed Clinical Social Worker

## 2018-06-01 NOTE — Telephone Encounter (Signed)
Palliative Care SW received a call from patient's daughter, Glenford Peers, [phone]).  She stated her father was doing well and did not wish for patient to receive services.  SW informed the Palliative Care team.

## 2018-06-06 ENCOUNTER — Other Ambulatory Visit: Payer: Self-pay

## 2018-06-06 ENCOUNTER — Ambulatory Visit (INDEPENDENT_AMBULATORY_CARE_PROVIDER_SITE_OTHER): Payer: Medicare Other | Admitting: Family Medicine

## 2018-06-06 ENCOUNTER — Encounter: Payer: Self-pay | Admitting: Family Medicine

## 2018-06-06 VITALS — BP 118/64 | HR 52 | Temp 98.0°F | Ht 66.0 in | Wt 150.5 lb

## 2018-06-06 DIAGNOSIS — I6381 Other cerebral infarction due to occlusion or stenosis of small artery: Secondary | ICD-10-CM

## 2018-06-06 DIAGNOSIS — E119 Type 2 diabetes mellitus without complications: Secondary | ICD-10-CM | POA: Diagnosis not present

## 2018-06-06 DIAGNOSIS — Z23 Encounter for immunization: Secondary | ICD-10-CM | POA: Diagnosis not present

## 2018-06-06 DIAGNOSIS — E785 Hyperlipidemia, unspecified: Secondary | ICD-10-CM

## 2018-06-06 DIAGNOSIS — R0789 Other chest pain: Secondary | ICD-10-CM | POA: Diagnosis not present

## 2018-06-06 DIAGNOSIS — E041 Nontoxic single thyroid nodule: Secondary | ICD-10-CM | POA: Diagnosis not present

## 2018-06-06 DIAGNOSIS — I10 Essential (primary) hypertension: Secondary | ICD-10-CM

## 2018-06-06 NOTE — Progress Notes (Signed)
Subjective:     Patient ID: James Pugh, male   DOB: 10/05/1945, 72 y.o.   MRN: [id number]  HPI  Patient seen for hospital follow-up  He was admitted on 9/11 with atypical chest pain.  He was doing well until day before admission when he went to pick up a heavy pot of soup from the stove.  They felt he may have strained his chest.  But that evening had increased soreness and pain worse with movement.  By the next morning his pain had progressed.  His location was substernal and somewhat bilateral.  He had some pain with deep breathing.  No radiation of pain.  No cough or fever.  No exertional type pain.  No lower extremity pain.  No palpitations.  Positive family history of CAD in his father in his 31s.  Patient quit smoking 1984.  Troponins were negative.  EKG no acute changes.  He had exercise stress test which came back negative.  Patient had CVA last February with no residual deficits.  Type 2 diabetes.  A1c 6.6%.  We decided against any diabetes medications at this point but preferring lifestyle management.  His A1c has been stable.  He remains on high-dose statin along with Plavix and low-dose lisinopril.  He has obstructive sleep apnea using CPAP at night.  There was mention on x-ray of multinodular goiter.  He had one nodule that met criteria for biopsy.  Past Medical History:  Diagnosis Date  . CERUMEN IMPACTION 08/22/2009  . HYPERLIPIDEMIA 06/06/2009  . PREDIABETES 07/13/2010  . Stroke [facility])    Past Surgical History:  Procedure Laterality Date  . TONSILLECTOMY      reports that he quit smoking about 34 years ago. His smoking use included cigarettes. He has a 20.00 pack-year smoking history. He has never used smokeless tobacco. He reports that he does not drink alcohol or use drugs. family history includes Alcohol abuse in his other; Diabetes in his father; Heart disease in his father; Stroke in his brother and father. No Known Allergies  Review of Systems  Constitutional:  Negative for fatigue.  Eyes: Negative for visual disturbance.  Respiratory: Negative for cough, chest tightness and shortness of breath.   Cardiovascular: Negative for chest pain, palpitations and leg swelling.  Gastrointestinal: Negative for abdominal pain.  Endocrine: Negative for polydipsia and polyuria.  Neurological: Negative for dizziness, syncope, weakness, light-headedness and headaches.       Objective:   Physical Exam  Constitutional: He is oriented to person, place, and time. He appears well-developed and well-nourished.  HENT:  Right Ear: External ear normal.  Left Ear: External ear normal.  Mouth/Throat: Oropharynx is clear and moist.  Eyes: Pupils are equal, round, and reactive to light.  Neck: Neck supple. No thyromegaly present.  Cardiovascular: Normal rate and regular rhythm.  Pulmonary/Chest: Effort normal and breath sounds normal. No respiratory distress. He has no wheezes. He has no rales.  Musculoskeletal: He exhibits no edema.  Neurological: He is alert and oriented to person, place, and time.       Assessment:     #1 recent atypical chest pain.  Ruled out for MI.  Stress test unremarkable.  Suspect this was more musculoskeletal pain  #2 past history of CVA stable symptomatically.  #3 history of type 2 diabetes fairly well-controlled currently without medication  #4 multinodular goiter with one nodule meeting criteria for biopsy  #5 history of obstructive sleep apnea    Plan:     -Flu vaccine  recommended and given. -Set up fine-needle aspirate of thyroid nodule -Recommend 73-monthfollow-up.  Will recheck A1c then.  Our goal is 6.5% or less  BEulas PostMD [facility]

## 2018-06-06 NOTE — Patient Instructions (Signed)
We are setting up fine needle biopsy of thyroid nodule.

## 2018-06-08 ENCOUNTER — Other Ambulatory Visit: Payer: Self-pay | Admitting: Family Medicine

## 2018-06-08 DIAGNOSIS — E041 Nontoxic single thyroid nodule: Secondary | ICD-10-CM

## 2018-06-11 ENCOUNTER — Other Ambulatory Visit: Payer: Self-pay | Admitting: Family Medicine

## 2018-06-21 ENCOUNTER — Ambulatory Visit
Admission: RE | Admit: 2018-06-21 | Discharge: 2018-06-21 | Disposition: A | Discharge status: Home / Self Care | Payer: Medicare Other | Source: Ambulatory Visit | Attending: Family Medicine | Admitting: Family Medicine

## 2018-06-21 ENCOUNTER — Other Ambulatory Visit (HOSPITAL_COMMUNITY)
Admission: RE | Admit: 2018-06-21 | Discharge: 2018-06-21 | Disposition: A | Discharge status: Home / Self Care | Payer: Medicare Other | Source: Ambulatory Visit | Attending: Student | Admitting: Student

## 2018-06-21 DIAGNOSIS — E041 Nontoxic single thyroid nodule: Secondary | ICD-10-CM | POA: Insufficient documentation

## 2018-07-05 ENCOUNTER — Ambulatory Visit: Payer: Medicare Other | Admitting: Adult Health

## 2018-07-05 ENCOUNTER — Encounter: Payer: Self-pay | Admitting: Adult Health

## 2018-07-06 ENCOUNTER — Ambulatory Visit (INDEPENDENT_AMBULATORY_CARE_PROVIDER_SITE_OTHER): Payer: Medicare Other | Admitting: Adult Health

## 2018-07-06 ENCOUNTER — Telehealth: Payer: Self-pay | Admitting: *Deleted

## 2018-07-06 ENCOUNTER — Encounter: Payer: Self-pay | Admitting: Adult Health

## 2018-07-06 VITALS — BP 114/59 | HR 54 | Ht 66.0 in | Wt 157.4 lb

## 2018-07-06 DIAGNOSIS — Z961 Presence of intraocular lens: Secondary | ICD-10-CM | POA: Diagnosis not present

## 2018-07-06 DIAGNOSIS — E785 Hyperlipidemia, unspecified: Secondary | ICD-10-CM

## 2018-07-06 DIAGNOSIS — I6350 Cerebral infarction due to unspecified occlusion or stenosis of unspecified cerebral artery: Secondary | ICD-10-CM

## 2018-07-06 DIAGNOSIS — H52203 Unspecified astigmatism, bilateral: Secondary | ICD-10-CM | POA: Diagnosis not present

## 2018-07-06 DIAGNOSIS — H53001 Unspecified amblyopia, right eye: Secondary | ICD-10-CM | POA: Diagnosis not present

## 2018-07-06 DIAGNOSIS — G4733 Obstructive sleep apnea (adult) (pediatric): Secondary | ICD-10-CM | POA: Diagnosis not present

## 2018-07-06 NOTE — Telephone Encounter (Signed)
Pt seen here 04/2018 with Dr. Rexene Alberts for cpap.  Was to have contacted [place] to have Dr. Rexene Alberts review cpap compliance 6 wks after this appt.  Wilber Bihari, NP today, and relayed to have [place] leave for Dr. Rexene Alberts.  Printed and on desk for her to review.

## 2018-07-06 NOTE — Progress Notes (Signed)
[facility] [address] [place]. [place] [address] [phone]       OFFICE FOLLOW-UP NOTE  James Pugh Date of Birth:  [birth year] Medical Record Number:  [id number]   Chief Complaint  Patient presents with  . Follow-up    Stroke follow up in back hallway room pt with James Pugh his wife      HPI: James Pugh is a 72 year old Caucasian male seen today in the office for follow-up of stroke in February 2019. History is obtained from the patient and his wife and review of electronic medical records. I have personally reviewed imaging films. James Pugh an 72 y.o.malewithout any significant past medical history presented to the ED with persistent left upper and lower extremity weakness, numbness tingling and ataxic gait which began the night before at 9 PM. Patient states last night around 9 PM he noted intermittent jerking of his left arm it would last for 30 seconds and continued until almost 4 AM on 10/22/17. Also that time, he noted he was leaning more to his left side with left upper extremity and left lower extremity weakness-feelings of loss of sensation, numbness and tingling in the left upper and left lower extremity and left face as well as unsteady gait. He noted his speech had been a little bit slurred but he went to bed and woke up this morning without any improvement in his symptoms. He called his daughter who was able to confirm that patient did have slurred speech with word finding difficulties. Admits to feeling off balanced yesterday through to today, denies associated headache, dizziness, visual changes-he had recent cataract surgery, nausea or vomiting. Patient denied chest pain, shortness of breath recent fevers, chills or illnesses but admits to being under immense stress. Patient was brought to the ER by his daughter, who describes patient with unsteady gait as he walked into the ER. Initial Noncon CT of the head did not show any acute  abnormalities. Due to patient presenting with posterior ischemic stroke symptoms, neuro was consulted. She denies a personal history of stroke but admits to history of stroke in his brother, as well as over 38-year 1 pack/day smoking history. Of note about 4 days ago, patient hit his head on a beam in his crawl space. He said he hit the top of his head, without any resultant loss of consciousness but he did see stars and denied any confusion. Patient's vitals and labs have remained stable while in the ER. Last seen normal 10/21/17 at 9 pm. TPA given no as out of time window.CT scan of the head showed no acute abnormality. MRI scan of the brain showed right lower pontine lacunar infarct. 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 2-D echo showed normal ejection fraction without cardiac source of embolism.  LDL cholesterol 142 mg percent and hemoglobin A1c 6.9. Patient was started on dual antiplatelet therapy of aspirin and Plavix. He subsequently had outpatient polysomnogram study done which showed severe obstructive sleep apnea with apnea-hypopnea index of 48.3/h.he has seen Dr. Arnell Sieving and plans on starting CPAP soon.  01/03/2018 visit  PS: Patient states his done well since discharge. His mid full recovery.No residual deficits. His tolerating aspirin and Plavix well without bleeding but does bruise easily. He is tolerating Lipitor well without muscle aches and pains. He had follow-up lipid profile checked last month and LDL was improved but still 80 mg percent. Patient did try out for the PREMIERS stroke prevention trial but  was a screen failure.  Interval history 07/06/2018: Patient is being seen today for six-month follow-up visit.  He continues to be followed in this office as well with Dr. Rexene Alberts for OSA and BiPAP management.  Patient states since previous follow-up visit he has been doing well without residual deficits or recurring of symptoms.  He continues to take Plavix  without side effects of bleeding or bruising.  Continues to take Lipitor without side effects myalgias.  Blood pressure today satisfactory at 114/59.  He does state compliance with BiPAP.  He continues to stay active and maintains a healthy diet.  No further concerns at this time.  Denies new or worsening stroke/TIA symptoms.   ROS:   14 system review of systems is positive for no complaints and all other systems negative PMH:  Past Medical History:  Diagnosis Date  . CERUMEN IMPACTION 08/22/2009  . HYPERLIPIDEMIA 06/06/2009  . PREDIABETES 07/13/2010  . Stroke [facility])     Social History:  Social History   Socioeconomic History  . Marital status: Married    Spouse name: Not on file  . Number of children: Not on file  . Years of education: Not on file  . Highest education level: Not on file  Occupational History  . Not on file  Social Needs  . Financial resource strain: Not on file  . Food insecurity:    Worry: Not on file    Inability: Not on file  . Transportation needs:    Medical: Not on file    Non-medical: Not on file  Tobacco Use  . Smoking status: Former Smoker    Packs/day: 1.00    Years: 20.00    Pack years: 20.00    Types: Cigarettes    Last attempt to quit: 08/11/1983    Years since quitting: 34.9  . Smokeless tobacco: Never Used  Substance and Sexual Activity  . Alcohol use: No  . Drug use: No  . Sexual activity: Not on file  Lifestyle  . Physical activity:    Days per week: Not on file    Minutes per session: Not on file  . Stress: Not on file  Relationships  . Social connections:    Talks on phone: Not on file    Gets together: Not on file    Attends religious service: Not on file    Active member of club or organization: Not on file    Attends meetings of clubs or organizations: Not on file    Relationship status: Not on file  . Intimate partner violence:    Fear of current or ex partner: Not on file    Emotionally abused: Not on file     Physically abused: Not on file    Forced sexual activity: Not on file  Other Topics Concern  . Not on file  Social History Narrative  . Not on file    Medications:   Current Outpatient Medications on File Prior to Visit  Medication Sig Dispense Refill  . atorvastatin (LIPITOR) 80 MG tablet TAKE 1 TABLET BY MOUTH EVERY DAY AT 6PM (Patient taking differently: Take 80 mg by mouth daily at 6 PM. ) 90 tablet 1  . clopidogrel (PLAVIX) 75 MG tablet TAKE 1 TABLET BY MOUTH EVERY DAY 90 tablet 1  . lisinopril (PRINIVIL,ZESTRIL) 2.5 MG tablet TAKE 1 TABLET BY MOUTH EVERY DAY 90 tablet 1   No current facility-administered medications on file prior to visit.     Allergies:  No Known Allergies  Physical Exam General: well developed, well nourished middle-age Caucasian male, seated, in no evident distress Head: head normocephalic and atraumatic.  Neck: supple with no carotid or supraclavicular bruits Cardiovascular: regular rate and rhythm, no murmurs Musculoskeletal: no deformity Skin:  no rash/petichiae Vascular:  Normal pulses all extremities Vitals:   07/06/18 1429  BP: (!) 114/59  Pulse: (!) 54   Neurologic Exam Mental Status: Awake and fully alert. Oriented to place and time. Recent and remote memory intact. Attention span, concentration and fund of knowledge appropriate. Mood and affect appropriate.  Cranial Nerves: Fundoscopic exam reveals sharp disc margins. Pupils equal, briskly reactive to light. Extraocular movements full without nystagmus. Visual fields full to confrontation. Hearing intact. Facial sensation intact. Face, tongue, palate moves normally and symmetrically.  Motor: Normal bulk and tone. Normal strength in all tested extremity muscles. Sensory.: intact to touch ,pinprick .position and vibratory sensation.  Coordination: Rapid alternating movements normal in all extremities. Finger-to-nose and heel-to-shin performed accurately bilaterally. Gait and Station: Arises from  chair without difficulty. Stance is normal. Gait demonstrates normal stride length and balance . Able to heel, toe and tandem walk without difficulty.  Reflexes: 1+ and symmetric. Toes downgoing.     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  Patient is being seen today for follow-up visit and overall has been stable from a stroke standpoint without residual deficits or recurring of symptoms.    PLAN: -Continue clopidogrel 75 mg daily  and atorvastatin 80 mg for secondary stroke prevention -f/u with Dr. Rexene Alberts for OSA and CPAP management -F/u with PCP regarding your HLD and HTN management -continue to monitor BP at home -advised to stay active and maintain a healthy diet -Maintain strict control of hypertension with blood pressure goal below 130/90, diabetes with hemoglobin A1c goal below 6.5% and cholesterol with LDL cholesterol (bad cholesterol) goal below 70 mg/dL. I also advised the patient to eat a healthy diet with plenty of whole grains, cereals, fruits and vegetables, exercise regularly and maintain ideal body weight.  Follow up with me as needed as patient stable from stroke standpoint and continue to follow up with Dr. Rexene Alberts for OSA and BiPAP management.   Greater than 50% of time during this 25 minute visit was spent on counseling,explanation of diagnosis of lacunar infarct and sleep apnea, planning of further management, discussion with patient and family and coordination of care  Venancio Poisson, [facility]  [facility] [address] [place],  [address]  Phone [phone] Fax [phone] Note: This document was prepared with digital dictation and possible smart phrase technology. Any transcriptional errors that result from this process are unintentional.

## 2018-07-06 NOTE — Patient Instructions (Signed)
Continue clopidogrel 75 mg daily  and lipitor  for secondary stroke prevention  Continue to follow up with PCP regarding cholesterol and blood pressure management   Continue to stay active and maintain a heathy diet  Continue to monitor blood pressure at home  Maintain strict control of hypertension with blood pressure goal below 130/90, diabetes with hemoglobin A1c goal below 6.5% and cholesterol with LDL cholesterol (bad cholesterol) goal below 70 mg/dL. I also advised the patient to eat a healthy diet with plenty of whole grains, cereals, fruits and vegetables, exercise regularly and maintain ideal body weight.  Followup in the future with me as needed or call earlier if needed       Thank you for coming to see [place] at [facility]. I hope we have been able to provide you high quality care today.  You may receive a patient satisfaction survey over the next few weeks. We would appreciate your feedback and comments so that we may continue to improve ourselves and the health of our patients.

## 2018-07-07 ENCOUNTER — Ambulatory Visit: Payer: Medicare Other | Admitting: Family Medicine

## 2018-07-07 NOTE — Progress Notes (Signed)
I agree with the above plan 

## 2018-07-10 NOTE — Telephone Encounter (Signed)
Pt called back, Sandy's message was garbled. I advised him of Dr. Guadelupe Sabin recommendations. Pt is agreeable to this plan and will follow up as planned in February. Pt verbalized understanding.

## 2018-07-10 NOTE — Telephone Encounter (Signed)
I reviewed patient's most recent BiPAP compliance data from 06/06/2018 through 07/05/2018 during which time he was 100 percent compliant with an average usage of 6 hours and 51 minutes, residual AHI is indeed quite improved on the new pressure setting of 17/13 cm, residual AHI at 5.9 per hour and leak very low. Please update patient that his new, slightly increased pressure setting is resulting in much better apnea control, if he is agreeable to continuing at this level of pressure, we can see him routinely as planned in 10/2018.

## 2018-07-10 NOTE — Telephone Encounter (Signed)
LMVM for pt that recent bipap information was 100% compliance.  AHI at 5.9 per hour and low leak. AHI residual  was improved on new pressure settings 17/13 cm and will continue if ok with him.  Will see in 11-06-18 with Dr. Rexene Alberts.  He is to call back if questions or concerns.

## 2018-08-30 ENCOUNTER — Other Ambulatory Visit: Payer: Self-pay

## 2018-08-30 ENCOUNTER — Encounter: Payer: Self-pay | Admitting: Family Medicine

## 2018-08-30 ENCOUNTER — Ambulatory Visit (INDEPENDENT_AMBULATORY_CARE_PROVIDER_SITE_OTHER): Payer: Medicare Other | Admitting: Family Medicine

## 2018-08-30 VITALS — BP 118/72 | HR 61 | Temp 97.7°F | Ht 66.0 in | Wt 154.9 lb

## 2018-08-30 DIAGNOSIS — I6302 Cerebral infarction due to thrombosis of basilar artery: Secondary | ICD-10-CM

## 2018-08-30 DIAGNOSIS — E785 Hyperlipidemia, unspecified: Secondary | ICD-10-CM

## 2018-08-30 DIAGNOSIS — E119 Type 2 diabetes mellitus without complications: Secondary | ICD-10-CM | POA: Diagnosis not present

## 2018-08-30 DIAGNOSIS — I6381 Other cerebral infarction due to occlusion or stenosis of small artery: Secondary | ICD-10-CM | POA: Diagnosis not present

## 2018-08-30 DIAGNOSIS — I10 Essential (primary) hypertension: Secondary | ICD-10-CM

## 2018-08-30 LAB — POCT GLYCOSYLATED HEMOGLOBIN (HGB A1C): HEMOGLOBIN A1C: 6.1 % — AB (ref 4.0–5.6)

## 2018-08-30 NOTE — Patient Instructions (Signed)

## 2018-08-30 NOTE — Addendum Note (Signed)
Addended by: Anibal Henderson on: 08/30/2018 08:36 AM   Modules accepted: Orders

## 2018-08-30 NOTE — Progress Notes (Signed)
  Subjective:     Patient ID: James Pugh, male   DOB: Jan 15, 1946, 72 y.o.   MRN: [id number]  HPI Patient is seen for medical follow-up.  History of stroke, hypertension, hyperlipidemia, type 2 diabetes.  Medications reviewed.  He remains on Plavix, lisinopril, atorvastatin.  Blood sugars been controlled off medication.  Does not monitor blood sugars regularly.  Last A1c 6.6%.  He has been fairly diligent with reducing sugars and starches.  No regular exercise.  Denies any recent chest pain.  Compliant with medications.  He had lipids done a few months ago with LDL cholesterol 79.  Blood pressures have consistently been well controlled.  No recent dizziness.  Past Medical History:  Diagnosis Date  . CERUMEN IMPACTION 08/22/2009  . HYPERLIPIDEMIA 06/06/2009  . PREDIABETES 07/13/2010  . Stroke [facility])    Past Surgical History:  Procedure Laterality Date  . TONSILLECTOMY      reports that he quit smoking about 35 years ago. His smoking use included cigarettes. He has a 20.00 pack-year smoking history. He has never used smokeless tobacco. He reports that he does not drink alcohol or use drugs. family history includes Alcohol abuse in an other family member; Diabetes in his father; Heart disease in his father; Stroke in his brother and father. No Known Allergies   Review of Systems  Constitutional: Negative for fatigue and unexpected weight change.  Eyes: Negative for visual disturbance.  Respiratory: Negative for cough, chest tightness and shortness of breath.   Cardiovascular: Negative for chest pain, palpitations and leg swelling.  Endocrine: Negative for polydipsia and polyuria.  Neurological: Negative for dizziness, syncope, weakness, light-headedness and headaches.       Objective:   Physical Exam Constitutional:      Appearance: He is well-developed.  HENT:     Right Ear: External ear normal.     Left Ear: External ear normal.  Eyes:     Pupils: Pupils are equal, round,  and reactive to light.  Neck:     Musculoskeletal: Neck supple.     Thyroid: No thyromegaly.  Cardiovascular:     Rate and Rhythm: Normal rate and regular rhythm.  Pulmonary:     Effort: Pulmonary effort is normal. No respiratory distress.     Breath sounds: Normal breath sounds. No wheezing or rales.  Neurological:     Mental Status: He is alert and oriented to person, place, and time.        Assessment:     #1 past history of CVA  #2 hypertension stable on low-dose lisinopril  #3 dyslipidemia with recent LDL cholesterol 79  #4 type 2 diabetes well-controlled without medication with A1c today 6.1%    Plan:     -We discussed goals for blood pressure 130/80 or less, A1c less than 6.5%, and LDL cholesterol less than 70.  He is reluctant to add additional cholesterol medication at this point already takes Lipitor 80 mg daily -Discussed low saturated fat diet. -Routine follow-up in 6 months and repeat fasting lipids and A1c then  [name] MD [facility]

## 2018-09-01 DIAGNOSIS — Z85828 Personal history of other malignant neoplasm of skin: Secondary | ICD-10-CM | POA: Diagnosis not present

## 2018-09-01 DIAGNOSIS — L570 Actinic keratosis: Secondary | ICD-10-CM | POA: Diagnosis not present

## 2018-09-01 DIAGNOSIS — D1801 Hemangioma of skin and subcutaneous tissue: Secondary | ICD-10-CM | POA: Diagnosis not present

## 2018-09-01 DIAGNOSIS — D485 Neoplasm of uncertain behavior of skin: Secondary | ICD-10-CM | POA: Diagnosis not present

## 2018-09-01 DIAGNOSIS — C4441 Basal cell carcinoma of skin of scalp and neck: Secondary | ICD-10-CM | POA: Diagnosis not present

## 2018-09-01 DIAGNOSIS — L821 Other seborrheic keratosis: Secondary | ICD-10-CM | POA: Diagnosis not present

## 2018-09-01 DIAGNOSIS — L812 Freckles: Secondary | ICD-10-CM | POA: Diagnosis not present

## 2018-11-01 ENCOUNTER — Encounter: Payer: Self-pay | Admitting: Neurology

## 2018-11-04 ENCOUNTER — Other Ambulatory Visit: Payer: Self-pay | Admitting: Family Medicine

## 2018-11-06 ENCOUNTER — Ambulatory Visit (INDEPENDENT_AMBULATORY_CARE_PROVIDER_SITE_OTHER): Payer: Medicare Other | Admitting: Neurology

## 2018-11-06 ENCOUNTER — Encounter: Payer: Self-pay | Admitting: Neurology

## 2018-11-06 VITALS — BP 122/73 | HR 72 | Ht 66.0 in | Wt 164.0 lb

## 2018-11-06 DIAGNOSIS — G4733 Obstructive sleep apnea (adult) (pediatric): Secondary | ICD-10-CM | POA: Diagnosis not present

## 2018-11-06 NOTE — Progress Notes (Signed)
Subjective:    Patient ID: James Pugh is a 73 y.o. male.  HPI     Interim history:   James Pugh is a 73-year-old right-handed gentleman with an underlying medical history of diabetes, R pontine stroke, hyperlipidemia, and overweight state, who presents for follow-up consultation of his obstructive sleep apnea, on BiPAP therapy. The patient is accompanied by his wife again today. I last saw him on 05/03/2018, at which time James Pugh was able to tolerate the BiPAP pressure. James Pugh was sleeping better at night per wife's report. His residual sleep disordered breathing improved after we increase the pressure to 16/12. I suggested we increase it further to 17/13 due to residual AHI around 10 at the time.  I reviewed his compliance data in the interim and AHI had improved. James Pugh was advised to continue with the pressure of 17/13 cm.  James Pugh was seen in the interim in stroke clinic for follow-up on 07/06/2018.  Today, 11/06/2018: I reviewed his BiPAP compliance data from 10/03/2018 through 11/01/2018 which is a total of 30 days, during which time James Pugh used his BiPAP every night except for 1, with percent used days greater than 4 hours at 97%, indicating excellent compliance with an average usage of 7 hours and 4 minutes, residual AHI at goal at 1.9 per hour, leak on the low side with the 95th percentile at 2.1 L/m on a pressure of 17/13. James Pugh reports doing well with his BiPAP. James Pugh tolerates the increase in pressure well. James Pugh has had no recent neurological symptoms thankfully. James Pugh did have chest pain and went to the emergency room in September 2019, James Pugh stayed overnight and cardiac workup was negative thankfully.   The patient's allergies, current medications, family history, past medical history, past social history, past surgical history and problem list were reviewed and updated as appropriate.   Previously:    I saw him on 03/22/2018, at which time James Pugh was compliant with BiPAP but his residual AHI was around 15. I  suggested we increase the pressure. James Pugh did report that sometimes it felt like James Pugh was not getting enough air in. Overall, James Pugh felt an improvement in his sleep quality and his wife noted that his daytime energy was better.    I reviewed his BiPAP compliance data from 04/02/2018 through 05/01/2018 which is a total of 30 days, during which time James Pugh used his BiPAP every night with percent used days greater than 4 hours at 100%, indicating superb compliance with an average usage of 6 hours and 40 minutes, residual AHI slightly suboptimal still at 10 per hour but improved from before, leak low with the 95th percentile at 3.4 L/m on a pressure of 16/12 cm.    I first met him on 11/14/2017 at the request of Dr. Xu, at which time James Pugh was reported to have witnessed breathing pauses while asleep, snoring and excessive daytime somnolence. James Pugh was advised to proceed with sleep study testing. James Pugh had a split-night sleep study on 12/20/2017. I went over his test results with him in detail today. James Pugh had significant sleep disruption at baseline, James Pugh had severe events, particularly in rem sleep. James Pugh did fairly well on CPAP but did have difficulty maintaining sleep later on. James Pugh was briefly tried on BiPAP of 14/10 but barely achieved any sleep on BiPAP during the study. I suggested a home treatment pressure of 12 cm. James Pugh called in the interim reporting residual increase in events. His AHI was around 15 on a pressure of 12 cm and in   mid-May I changed his pressure setting to BiPAP of 14/10. James Pugh has had difficulty with mask tolerance  and seal and try different masks in the interim. James Pugh does report difficulty getting in touch with his DME company recently.     I reviewed his BiPAP compliance data from the last 30 days which is from 02/19/2018 through 03/20/2018, during which time James Pugh used his machine every night except for 1, percent used days greater than 4 hours was 97% which is excellent, average usage also excellent at 6 hours and 59  minutes, residual AHI suboptimal at 14.8 per hour primarily because of obstructive events. Previously on CPAP James Pugh did have more central events. Seal is very good, 95th percentile of leak at 2.6 L/m on a pressure of 14/10. Ramp time per wife's report is 5 minutes.    11/14/2017: (James Pugh) reports snoring and excessive daytime somnolence. James Pugh has been witnessed to have witnessed breathing pauses while asleep. James Pugh has woken up with a sense of shortness of breath. James Pugh had extensive stroke workup during his hospitalization from 10/22/2017 through 10/24/2017. James Pugh was in an inpatient rehabilitation from 10/24/2017 through 10/31/2017. I reviewed the hospital records. His Epworth sleepiness score is 9 out of 24 today, fatigue score is 12 out of 63.  James Pugh sleeps on his sides and back. BT is around 11 PM and WT around 7 AM. 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 Quit smoking about 40 years, no EtOH, James Pugh does not drink caffeine on a regular basis, no FHx of OSA. James Pugh had a TE as a child.  James Pugh denies AM HAs, has nocturia about 1-2/night on average. James Pugh takes a nap in the afternoons sometimes.    His Past Medical History Is Significant For: Past Medical History:  Diagnosis Date  . CERUMEN IMPACTION 08/22/2009  . HYPERLIPIDEMIA 06/06/2009  . PREDIABETES 07/13/2010  . Stroke (HCC)     His Past Surgical History Is Significant For: Past Surgical History:  Procedure Laterality Date  . TONSILLECTOMY      His Family History Is Significant For: Family History  Problem Relation Age of Onset  . Stroke Father   . Heart disease Father   . Diabetes Father        type ll  . Alcohol abuse Other   . Stroke Brother     His Social History Is Significant For: Social History   Socioeconomic History  . Marital status: Married    Spouse name: Not on file  . Number of children: Not on file  . Years of education: Not on file  . Highest education level: Not on file  Occupational History  . Not on file  Social Needs  .  Financial resource strain: Not on file  . Food insecurity:    Worry: Not on file    Inability: Not on file  . Transportation needs:    Medical: Not on file    Non-medical: Not on file  Tobacco Use  . Smoking status: Former Smoker    Packs/day: 1.00    Years: 20.00    Pack years: 20.00    Types: Cigarettes    Last attempt to quit: 08/11/1983    Years since quitting: 35.2  . Smokeless tobacco: Never Used  Substance and Sexual Activity  . Alcohol use: No  . Drug use: No  . Sexual activity: Not on file  Lifestyle  . Physical activity:    Days per week: Not on file    Minutes per session: Not   on file  . Stress: Not on file  Relationships  . Social connections:    Talks on phone: Not on file    Gets together: Not on file    Attends religious service: Not on file    Active member of club or organization: Not on file    Attends meetings of clubs or organizations: Not on file    Relationship status: Not on file  Other Topics Concern  . Not on file  Social History Narrative  . Not on file    His Allergies Are:  No Known Allergies:   His Current Medications Are:  Outpatient Encounter Medications as of 11/06/2018  Medication Sig  . atorvastatin (LIPITOR) 80 MG tablet TAKE 1 TABLET BY MOUTH EVERY DAY AT 6PM (Patient taking differently: Take 80 mg by mouth daily at 6 PM. )  . clopidogrel (PLAVIX) 75 MG tablet TAKE 1 TABLET BY MOUTH EVERY DAY  . lisinopril (PRINIVIL,ZESTRIL) 2.5 MG tablet TAKE 1 TABLET BY MOUTH EVERY DAY   No facility-administered encounter medications on file as of 11/06/2018.   :  Review of Systems:  Out of a complete 14 point review of systems, all are reviewed and negative with the exception of these symptoms as listed below: Review of Systems  Neurological:       Pt presents today to discuss his bipap. Pt reports that his bipap is going well.    Objective:  Neurological Exam  Physical Exam Physical Examination:   Vitals:   11/06/18 1120  BP:  122/73  Pulse: 72   General Examination: The patient is a very pleasant 72 y.o. male in no acute distress. James Pugh appears well-developed and well-nourished and well groomed.   HEENT:Normocephalic, atraumatic, pupils are equal, round and reactive. Extraocular tracking is good, James Pugh is status post cataract surgeries and has corrective eyeglasses in place. Face is symmetric, speech clear.  Chest:Clear to auscultation without wheezing, rhonchi or crackles noted.  Heart:S1+S2+0, regular and normal without murmurs, rubs or gallops noted.   Abdomen:Soft, non-tender and non-distended.  Extremities:There isno edema in the distal lower extremities bilaterally.   Skin: Warm and dry without trophic changes noted. Sun exposure type changes.   Musculoskeletal: exam reveals no obvious joint deformities, tenderness or joint swelling or erythema.   Neurologically:  Mental status: The patient is awake, alert and oriented in all 4 spheres.Hisimmediate and remote memory, attention, language skills and fund of knowledge are appropriate. There is no evidence of aphasia, agnosia, apraxia or anomia. Speech is clear with normal prosody and enunciation. Thought process is linear. Mood is normaland affect is normal.  Cranial nerves II - XII are as described above under HEENT exam.  Motor exam: Normal bulk, and tone is noted. There is no tremor. Fine motor skills and coordination:grosslyintact.  Cerebellar testing: No dysmetria or intention tremor. There is no truncal or gait ataxia.  Sensory exam: intact to light touch.  Gait, station and balance:James Pughstands easily. No veering to one side is noted. No leaning to one side is noted. Posture is age-appropriate and stance is narrow based. Gait showsnormalstride length and normalpace. No problems turning are noted.  Assessmentand Plan:  In summary,James Pughis a very pleasant 72-year oldmalewith an underlying medical history of  diabetes, right pontine stroke, hyperlipidemia, andborderlineoverweight state, whopresents for follow-up consultation of his obstructive sleep apnea which was determined to be severe during his split-night study in April 2019. James Pugh started CPAP therapy for about a month and switched to BiPAP therapy secondary to   central apneas noted. James Pugh was able to tolerate BiPAP better than CPAP thankfully. We increased the pressure to 16/12, and then further to 17/13 cm in August 2019. His AHI improved, James Pugh is fully compliant with treatment and highly commended for this. James Pugh has benefited from sleep apnea treatment with BiPAP. His sleep is better quality and better consolidated and James Pugh is less tired/sleepy during the day. James Pugh is encouraged to continue with treatment. His wife had questions regarding the Inspire device, which I answered to the best of my knowledge. James Pugh is advised to follow-up routinely in one year, James Pugh can see Jessica, NP. I answered all their questions today and the patient and his wife were in agreement. I spent 15 minutes in total face-to-face time with the patient, more than 50% of which was spent in counseling and coordination of care, reviewing test results, reviewing medication and discussing or reviewing the diagnosis of OSA. Pertinent laboratory and imaging test results that were available during this visit with the patient were reviewed by me and considered in my medical decision making (see chart for details).   

## 2018-11-06 NOTE — Patient Instructions (Addendum)
Please continue using your BiPAP regularly. While your insurance requires that you use BiPAP at least 4 hours each night on 70% of the nights, I recommend, that you not skip any nights and use it throughout the night if you can. Getting used to BiPAP and staying with the treatment long term does take time and patience and discipline. Untreated obstructive sleep apnea when it is moderate to severe can have an adverse impact on cardiovascular health and raise her risk for heart disease, arrhythmias, hypertension, congestive heart failure, stroke and diabetes. Untreated obstructive sleep apnea causes sleep disruption, nonrestorative sleep, and sleep deprivation. This can have an impact on your day to day functioning and cause daytime sleepiness and impairment of cognitive function, memory loss, mood disturbance, and problems focussing. Using BiPAP regularly can improve these symptoms.  Keep up the good work! We will have you see Gertie Gowda., NP in one year.

## 2018-11-16 DIAGNOSIS — Z85828 Personal history of other malignant neoplasm of skin: Secondary | ICD-10-CM | POA: Diagnosis not present

## 2018-11-16 DIAGNOSIS — C44319 Basal cell carcinoma of skin of other parts of face: Secondary | ICD-10-CM | POA: Diagnosis not present

## 2018-11-16 DIAGNOSIS — D485 Neoplasm of uncertain behavior of skin: Secondary | ICD-10-CM | POA: Diagnosis not present

## 2018-11-16 DIAGNOSIS — L570 Actinic keratosis: Secondary | ICD-10-CM | POA: Diagnosis not present

## 2018-11-27 DIAGNOSIS — Z85828 Personal history of other malignant neoplasm of skin: Secondary | ICD-10-CM | POA: Diagnosis not present

## 2018-11-27 DIAGNOSIS — C44329 Squamous cell carcinoma of skin of other parts of face: Secondary | ICD-10-CM | POA: Diagnosis not present

## 2018-12-03 ENCOUNTER — Other Ambulatory Visit: Payer: Self-pay | Admitting: Family Medicine

## 2018-12-29 ENCOUNTER — Telehealth: Payer: Self-pay | Admitting: *Deleted

## 2018-12-29 NOTE — Telephone Encounter (Signed)
Pt declined awv would rather have in office visit

## 2019-01-01 ENCOUNTER — Telehealth: Payer: Self-pay | Admitting: *Deleted

## 2019-01-01 NOTE — Telephone Encounter (Signed)
Copied from [place] [id number]. Topic: General - Call Back - No Documentation >> Jan 01, 2019 12:57 PM Selinda Flavin B, NT wrote: Reason for CRM: Patient states that he received a phone call from the office telling him to call back. No documentation found. Please advise.

## 2019-01-01 NOTE — Telephone Encounter (Signed)
Not sure who called this patient but only a recent call from [facility] about the AWV which is documented. No further work up needed as last [place] states for patient to come back in June 2020.

## 2019-03-07 DIAGNOSIS — D1801 Hemangioma of skin and subcutaneous tissue: Secondary | ICD-10-CM | POA: Diagnosis not present

## 2019-03-07 DIAGNOSIS — L821 Other seborrheic keratosis: Secondary | ICD-10-CM | POA: Diagnosis not present

## 2019-03-07 DIAGNOSIS — L570 Actinic keratosis: Secondary | ICD-10-CM | POA: Diagnosis not present

## 2019-03-07 DIAGNOSIS — Z85828 Personal history of other malignant neoplasm of skin: Secondary | ICD-10-CM | POA: Diagnosis not present

## 2019-03-07 DIAGNOSIS — L812 Freckles: Secondary | ICD-10-CM | POA: Diagnosis not present

## 2019-04-30 ENCOUNTER — Other Ambulatory Visit: Payer: Self-pay | Admitting: Family Medicine

## 2019-05-08 ENCOUNTER — Other Ambulatory Visit: Payer: Self-pay | Admitting: Family Medicine

## 2019-05-08 NOTE — Telephone Encounter (Signed)
Patient has an appointment on 05/14/19. Will fill at this time

## 2019-05-11 ENCOUNTER — Encounter: Payer: Self-pay | Admitting: Family Medicine

## 2019-05-11 DIAGNOSIS — Z1211 Encounter for screening for malignant neoplasm of colon: Secondary | ICD-10-CM | POA: Diagnosis not present

## 2019-05-11 DIAGNOSIS — D123 Benign neoplasm of transverse colon: Secondary | ICD-10-CM | POA: Diagnosis not present

## 2019-05-14 ENCOUNTER — Ambulatory Visit (INDEPENDENT_AMBULATORY_CARE_PROVIDER_SITE_OTHER): Payer: Medicare Other | Admitting: Family Medicine

## 2019-05-14 ENCOUNTER — Encounter: Payer: Self-pay | Admitting: Family Medicine

## 2019-05-14 ENCOUNTER — Other Ambulatory Visit: Payer: Self-pay

## 2019-05-14 VITALS — BP 128/68 | HR 63 | Temp 97.9°F | Ht 65.0 in | Wt 159.0 lb

## 2019-05-14 DIAGNOSIS — Z23 Encounter for immunization: Secondary | ICD-10-CM | POA: Diagnosis not present

## 2019-05-14 DIAGNOSIS — I10 Essential (primary) hypertension: Secondary | ICD-10-CM

## 2019-05-14 DIAGNOSIS — E785 Hyperlipidemia, unspecified: Secondary | ICD-10-CM

## 2019-05-14 DIAGNOSIS — E119 Type 2 diabetes mellitus without complications: Secondary | ICD-10-CM

## 2019-05-14 LAB — HEPATIC FUNCTION PANEL
ALT: 29 U/L (ref 0–53)
AST: 21 U/L (ref 0–37)
Albumin: 4.3 g/dL (ref 3.5–5.2)
Alkaline Phosphatase: 48 U/L (ref 39–117)
Bilirubin, Direct: 0.1 mg/dL (ref 0.0–0.3)
Total Bilirubin: 0.5 mg/dL (ref 0.2–1.2)
Total Protein: 6.9 g/dL (ref 6.0–8.3)

## 2019-05-14 LAB — BASIC METABOLIC PANEL
BUN: 16 mg/dL (ref 6–23)
CO2: 30 mEq/L (ref 19–32)
Calcium: 9.9 mg/dL (ref 8.4–10.5)
Chloride: 102 mEq/L (ref 96–112)
Creatinine, Ser: 0.84 mg/dL (ref 0.40–1.50)
GFR: 89.09 mL/min (ref >60.00)
Glucose, Bld: 102 mg/dL — ABNORMAL HIGH (ref 70–99)
Potassium: 4.5 mEq/L (ref 3.5–5.1)
Sodium: 139 mEq/L (ref 135–145)

## 2019-05-14 LAB — LIPID PANEL
Cholesterol: 130 mg/dL (ref 0–200)
HDL: 40.3 mg/dL (ref >39.00)
LDL Cholesterol: 74 mg/dL (ref 0–99)
NonHDL: 89.43
Total CHOL/HDL Ratio: 3
Triglycerides: 78 mg/dL (ref 0.0–149.0)
VLDL: 15.6 mg/dL (ref 0.0–40.0)

## 2019-05-14 LAB — HEMOGLOBIN A1C: Hgb A1c MFr Bld: 7 % — ABNORMAL HIGH (ref 4.6–6.5)

## 2019-05-14 MED ORDER — ATORVASTATIN CALCIUM 80 MG PO TABS: ORAL_TABLET | ORAL | 3 refills | Status: DC

## 2019-05-14 MED ORDER — LISINOPRIL 2.5 MG PO TABS: 2.5000 mg | ORAL_TABLET | Freq: Every day | ORAL | 3 refills | Status: DC

## 2019-05-14 MED ORDER — CLOPIDOGREL BISULFATE 75 MG PO TABS: 75.0000 mg | ORAL_TABLET | Freq: Every day | ORAL | 3 refills | Status: DC

## 2019-05-14 NOTE — Progress Notes (Signed)
  Subjective:     Patient ID: James Pugh, male   DOB: 14-May-1946, 73 y.o.   MRN: [id number]  HPI James Pugh is seen for medical follow-up.  He has history of hypertension, history of CVA, diet-controlled type 2 diabetes, dyslipidemia.  Generally doing well.  He remains on Plavix, low-dose lisinopril, atorvastatin.  Needs refills of all medications.  Denies any recent chest pains or dizziness.  No dyspnea.  No fevers or chills.  Does not monitor blood sugars at home.  No polyuria or polydipsia.  Needs flu vaccine.  No recent falls.  No balance issues.   Past Medical History:  Diagnosis Date  . CERUMEN IMPACTION 08/22/2009  . HYPERLIPIDEMIA 06/06/2009  . PREDIABETES 07/13/2010  . Stroke [facility])    Past Surgical History:  Procedure Laterality Date  . TONSILLECTOMY      reports that he quit smoking about 35 years ago. His smoking use included cigarettes. He has a 20.00 pack-year smoking history. He has never used smokeless tobacco. He reports that he does not drink alcohol or use drugs. family history includes Alcohol abuse in an other family member; Diabetes in his father; Heart disease in his father; Stroke in his brother and father. No Known Allergies   Review of Systems  Constitutional: Negative for fatigue.  Eyes: Negative for visual disturbance.  Respiratory: Negative for cough, chest tightness and shortness of breath.   Cardiovascular: Negative for chest pain, palpitations and leg swelling.  Endocrine: Negative for polydipsia and polyuria.  Neurological: Negative for dizziness, syncope, weakness, light-headedness and headaches.       Objective:   Physical Exam Constitutional:      Appearance: He is well-developed.  HENT:     Right Ear: External ear normal.     Left Ear: External ear normal.  Eyes:     Pupils: Pupils are equal, round, and reactive to light.  Neck:     Musculoskeletal: Neck supple.     Thyroid: No thyromegaly.  Cardiovascular:     Rate and Rhythm: Normal  rate and regular rhythm.  Pulmonary:     Effort: Pulmonary effort is normal. No respiratory distress.     Breath sounds: Normal breath sounds. No wheezing or rales.  Neurological:     Mental Status: He is alert and oriented to person, place, and time.        Assessment:     #1 past history of cerebrovascular disease.  We discussed importance of secondary prevention  #2 hypertension stable and at goal  #3 dyslipidemia  #4 history of diet-controlled type 2 diabetes with last A1c 6.1%    Plan:     -Flu vaccine given -Refilled medications for 1 year -Check A1c, lipid, hepatic, basic metabolic panel -57-month follow-up and sooner as needed  James Post MD [facility]

## 2019-05-15 DIAGNOSIS — D123 Benign neoplasm of transverse colon: Secondary | ICD-10-CM | POA: Diagnosis not present

## 2019-07-09 ENCOUNTER — Other Ambulatory Visit: Payer: Self-pay

## 2019-07-09 DIAGNOSIS — Z20828 Contact with and (suspected) exposure to other viral communicable diseases: Secondary | ICD-10-CM | POA: Diagnosis not present

## 2019-07-09 DIAGNOSIS — Z20822 Contact with and (suspected) exposure to covid-19: Secondary | ICD-10-CM

## 2019-07-10 LAB — NOVEL CORONAVIRUS, NAA: SARS-CoV-2, NAA: NOT DETECTED

## 2019-08-02 DIAGNOSIS — Z961 Presence of intraocular lens: Secondary | ICD-10-CM | POA: Diagnosis not present

## 2019-08-02 DIAGNOSIS — H53001 Unspecified amblyopia, right eye: Secondary | ICD-10-CM | POA: Diagnosis not present

## 2019-08-02 DIAGNOSIS — H5203 Hypermetropia, bilateral: Secondary | ICD-10-CM | POA: Diagnosis not present

## 2019-08-02 DIAGNOSIS — H52203 Unspecified astigmatism, bilateral: Secondary | ICD-10-CM | POA: Diagnosis not present

## 2019-08-13 ENCOUNTER — Ambulatory Visit (INDEPENDENT_AMBULATORY_CARE_PROVIDER_SITE_OTHER): Payer: Medicare Other | Admitting: Family Medicine

## 2019-08-13 ENCOUNTER — Encounter: Payer: Self-pay | Admitting: Family Medicine

## 2019-08-13 ENCOUNTER — Other Ambulatory Visit: Payer: Self-pay

## 2019-08-13 VITALS — BP 126/70 | HR 69 | Temp 97.3°F | Ht 65.0 in | Wt 154.2 lb

## 2019-08-13 DIAGNOSIS — E119 Type 2 diabetes mellitus without complications: Secondary | ICD-10-CM | POA: Diagnosis not present

## 2019-08-13 DIAGNOSIS — Z20828 Contact with and (suspected) exposure to other viral communicable diseases: Secondary | ICD-10-CM | POA: Diagnosis not present

## 2019-08-13 DIAGNOSIS — I10 Essential (primary) hypertension: Secondary | ICD-10-CM | POA: Diagnosis not present

## 2019-08-13 DIAGNOSIS — E785 Hyperlipidemia, unspecified: Secondary | ICD-10-CM

## 2019-08-13 DIAGNOSIS — Z20822 Contact with and (suspected) exposure to covid-19: Secondary | ICD-10-CM

## 2019-08-13 LAB — SARS-COV-2 IGG: SARS-COV-2 IgG: 24.27

## 2019-08-13 LAB — POCT GLYCOSYLATED HEMOGLOBIN (HGB A1C): Hemoglobin A1C: 6.4 % — AB (ref 4.0–5.6)

## 2019-08-13 NOTE — Patient Instructions (Signed)

## 2019-08-13 NOTE — Progress Notes (Signed)
  Subjective:     Patient ID: James Pugh, male   DOB: [birth year], 73 y.o.   MRN: [id number]  HPI   James Pugh is here for medical follow-up.  His history of CVA, hypertension, hyperlipidemia.  He had several issues to discuss as follows  We reviewed his medications he is compliant with those.  He had recent lipids and his LDL was stable at 74.  His last A1c had climbed up to 7.0%.  He has not made much dietary change thus far.  Not monitoring blood sugars regularly.  Currently not on any diabetes medications.  He preferred lifestyle management.  Wife diagnosed with Covid few months ago.  She has recovered at this time.  James Pugh had only some mild symptoms of nasal congestion.  No fever.  He is requesting IgG antibody testing now.  He had some questions regarding PSA testing.  Last tested 2018 with PSA less than 1 at that time.  We reviewed [place] preventive task force guidelines that do not recommend screening after 70.  He is asymptomatic.  Past Medical History:  Diagnosis Date  . CERUMEN IMPACTION 08/22/2009  . HYPERLIPIDEMIA 06/06/2009  . PREDIABETES 07/13/2010  . Stroke [facility])    Past Surgical History:  Procedure Laterality Date  . TONSILLECTOMY      reports that he quit smoking about 36 years ago. His smoking use included cigarettes. He has a 20.00 pack-year smoking history. He has never used smokeless tobacco. He reports that he does not drink alcohol or use drugs. family history includes Alcohol abuse in an other family member; Diabetes in his father; Heart disease in his father; Stroke in his brother and father. No Known Allergies   Review of Systems  Constitutional: Negative for fatigue.  Eyes: Negative for visual disturbance.  Respiratory: Negative for cough, chest tightness and shortness of breath.   Cardiovascular: Negative for chest pain, palpitations and leg swelling.  Endocrine: Negative for polydipsia and polyuria.  Neurological: Negative for dizziness, syncope,  weakness, light-headedness and headaches.       Objective:   Physical Exam Constitutional:      Appearance: He is well-developed.  HENT:     Right Ear: External ear normal.     Left Ear: External ear normal.  Eyes:     Pupils: Pupils are equal, round, and reactive to light.  Neck:     Musculoskeletal: Neck supple.     Thyroid: No thyromegaly.  Cardiovascular:     Rate and Rhythm: Normal rate and regular rhythm.  Pulmonary:     Effort: Pulmonary effort is normal. No respiratory distress.     Breath sounds: Normal breath sounds. No wheezing or rales.  Neurological:     Mental Status: He is alert and oriented to person, place, and time.        Assessment:     #1 type 2 diabetes improved with A1c 6.4%  #2 hypertension stable and at goal  #3 recent lipids at goal.  #4+ Covid 19 exposure through his wife who had active infection    Plan:     -Patient requesting Covid 19 antibodies -We had a long discussion regarding pros and cons of PSA screening and at his age have not recommended at this time -Continue low glycemic diet.  We agreed to 82-month follow-up -Flu vaccine already given  [name] MD [facility] at [facility]

## 2019-10-17 ENCOUNTER — Telehealth: Payer: Self-pay

## 2019-10-17 NOTE — Telephone Encounter (Signed)
We received a letter from [facility] for the approval of Pantoprazole (Protonix) and approved from 09/02/2019 through 10/01/2020.

## 2019-11-08 ENCOUNTER — Ambulatory Visit: Payer: Medicare Other | Admitting: Adult Health

## 2019-11-13 ENCOUNTER — Ambulatory Visit: Payer: Medicare Other | Admitting: Adult Health

## 2019-12-10 ENCOUNTER — Other Ambulatory Visit: Payer: Self-pay

## 2019-12-10 ENCOUNTER — Encounter: Payer: Self-pay | Admitting: Adult Health

## 2019-12-10 ENCOUNTER — Ambulatory Visit (INDEPENDENT_AMBULATORY_CARE_PROVIDER_SITE_OTHER): Payer: Medicare Other | Admitting: Adult Health

## 2019-12-10 VITALS — BP 135/77 | HR 62 | Temp 97.7°F | Ht 66.0 in | Wt 160.0 lb

## 2019-12-10 DIAGNOSIS — I6350 Cerebral infarction due to unspecified occlusion or stenosis of unspecified cerebral artery: Secondary | ICD-10-CM

## 2019-12-10 DIAGNOSIS — G4733 Obstructive sleep apnea (adult) (pediatric): Secondary | ICD-10-CM

## 2019-12-10 NOTE — Patient Instructions (Signed)
Continue current BiPAP settings and continue to follow with your DME company with any BiPAP questions or concerns as well as ongoing supplies.  Continue Plavix and atorvastatin and follow-up with PCP for secondary stroke risk factor management    Follow-up in 1 year or call earlier if needed

## 2019-12-10 NOTE — Progress Notes (Addendum)
Patient ID: James Pugh Reason for visit: BiPAP compliance visit [place] provider: Dr. Rexene Alberts   Subjective:    Chief complaint: Chief Complaint  Patient presents with  . Follow-up    room 9, alone, BiPap, pt states he is doing well     HPI:  James Pugh is a 74 year old male who is being seen today, 12/10/2019, for follow-up with underlying medical history of sleep apnea on BiPAP, DM, HLD and right pontine stroke 2019.  Compliance report from 11/06/2019 -12/05/2019 shows 28 out of 30 usage days and 28 days greater than 4 hours for 93% compliance.  Average usage 6 hours and 56 minutes with residual AHI 1.4.  Leaks in the 95th percentile 1.7. IPAP 17 and EPAP 13.  He continues to do well with ongoing use of BiPAP tolerating without difficulty.  He does report an approximately 3 weeks he will have increased leaking around his mask and needs to replace the seal every 4 weeks (recives new seal every 4 weeks from [facility]).  ESS 4.  He has received both COVID-19 vaccinations.  He has been stable from a stroke standpoint without new or reoccurring stroke/TIA symptoms and no residual deficits.  Continues on Plavix and atorvastatin for secondary stroke prevention without side effects.  Blood pressure today satisfactory at 135/77.  Continues to follow with PCP regularly for chronic disease management.     History: Provided for reference purposes only Visit 11/06/2018 Dr. Rexene Alberts: James Pugh is a 74 year old right-handed gentleman with an underlying medical history of diabetes, R pontine stroke, hyperlipidemia, and overweight state, who presents for follow-up consultation of his obstructive sleep apnea, on BiPAP therapy. The patient is accompanied by his wife again today. I last saw him on 05/03/2018, at which time he was able to tolerate the BiPAP pressure. He was sleeping better at night per wife's report. His residual sleep disordered breathing improved after we increase the pressure to 16/12.  I suggested we increase it further to 17/13 due to residual AHI around 10 at the time.  I reviewed his compliance data in the interim and AHI had improved. He was advised to continue with the pressure of 17/13 cm.  He was seen in the interim in stroke clinic for follow-up on 07/06/2018.  Today, 11/06/2018: I reviewed his BiPAP compliance data from 10/03/2018 through 11/01/2018 which is a total of 30 days, during which time he used his BiPAP every night except for 1, with percent used days greater than 4 hours at 97%, indicating excellent compliance with an average usage of 7 hours and 4 minutes, residual AHI at goal at 1.9 per hour, leak on the low side with the 95th percentile at 2.1 L/m on a pressure of 17/13. He reports doing well with his BiPAP. He tolerates the increase in pressure well. He has had no recent neurological symptoms thankfully. He did have chest pain and went to the emergency room in September 2019, he stayed overnight and cardiac workup was negative thankfully.   The patient's allergies, current medications, family history, past medical history, past social history, past surgical history and problem list were reviewed and updated as appropriate.    I saw him on 03/22/2018, at which time he was compliant with BiPAP but his residual AHI was around 15. I suggested we increase the pressure. He did report that sometimes it felt like he was not getting enough air in. Overall, he felt an improvement in his sleep quality and his wife noted  that his daytime energy was better.    I reviewed his BiPAP compliance data from 04/02/2018 through 05/01/2018 which is a total of 30 days, during which time he used his BiPAP every night with percent used days greater than 4 hours at 100%, indicating superb compliance with an average usage of 6 hours and 40 minutes, residual AHI slightly suboptimal still at 10 per hour but improved from before, leak low with the 95th percentile at 3.4 L/m on a pressure of  16/12 cm.    I first met him on 11/14/2017 at the request of Dr. Erlinda Hong, at which time he was reported to have witnessed breathing pauses while asleep, snoring and excessive daytime somnolence. He was advised to proceed with sleep study testing. He had a split-night sleep study on 12/20/2017. I went over his test results with him in detail today. He had significant sleep disruption at baseline, he had severe events, particularly in rem sleep. He did fairly well on CPAP but did have difficulty maintaining sleep later on. He was briefly tried on BiPAP of 14/10 but barely achieved any sleep on BiPAP during the study. I suggested a home treatment pressure of 12 cm. He called in the interim reporting residual increase in events. His AHI was around 15 on a pressure of 12 cm and in mid-May I changed his pressure setting to BiPAP of 14/10. He has had difficulty with mask tolerance  and seal and try different masks in the interim. He does report difficulty getting in touch with his DME company recently.     I reviewed his BiPAP compliance data from the last 30 days which is from 02/19/2018 through 03/20/2018, during which time he used his machine every night except for 1, percent used days greater than 4 hours was 97% which is excellent, average usage also excellent at 6 hours and 59 minutes, residual AHI suboptimal at 14.8 per hour primarily because of obstructive events. Previously on CPAP he did have more central events. Seal is very good, 95th percentile of leak at 2.6 L/m on a pressure of 14/10. Ramp time per wife's report is 5 minutes.    11/14/2017: (He) reports snoring and excessive daytime somnolence. He has been witnessed to have witnessed breathing pauses while asleep. He has woken up with a sense of shortness of breath. He had extensive stroke workup during his hospitalization from 10/22/2017 through 10/24/2017. He was in an inpatient rehabilitation from 10/24/2017 through 10/31/2017. I reviewed the hospital  records. His Epworth sleepiness score is 9 out of 24 today, fatigue score is 12 out of 63.  He sleeps on his sides and back. BT is around 11 PM and WT around 7 AM. He is retired from [employer], Engineer, maintenance (IT). Quit smoking about 40 years, no EtOH, he does not drink caffeine on a regular basis, no FHx of OSA. He had a TE as a child.  He denies AM HAs, has nocturia about 1-2/night on average. He takes a nap in the afternoons sometimes.    His Past Medical History Is Significant For: Past Medical History:  Diagnosis Date  . CERUMEN IMPACTION 08/22/2009  . HYPERLIPIDEMIA 06/06/2009  . PREDIABETES 07/13/2010  . Stroke [facility])     His Past Surgical History Is Significant For: Past Surgical History:  Procedure Laterality Date  . TONSILLECTOMY      His Family History Is Significant For: Family History  Problem Relation Age of Onset  . Stroke Father   . Heart disease Father   .  Diabetes Father        type ll  . Alcohol abuse Other   . Stroke Brother     His Social History Is Significant For: Social History   Socioeconomic History  . Marital status: Married    Spouse name: Not on file  . Number of children: Not on file  . Years of education: Not on file  . Highest education level: Not on file  Occupational History  . Not on file  Tobacco Use  . Smoking status: Former Smoker    Packs/day: 1.00    Years: 20.00    Pack years: 20.00    Types: Cigarettes    Quit date: 08/11/1983    Years since quitting: 36.3  . Smokeless tobacco: Never Used  Substance and Sexual Activity  . Alcohol use: No  . Drug use: No  . Sexual activity: Not on file  Other Topics Concern  . Not on file  Social History Narrative  . Not on file   Social Determinants of Health   Financial Resource Strain:   . Difficulty of Paying Living Expenses:   Food Insecurity:   . Worried About Charity fundraiser in the Last Year:   . Arboriculturist in the Last Year:   Transportation Needs:   . Lexicographer (Medical):   Marland Kitchen Lack of Transportation (Non-Medical):   Physical Activity:   . Days of Exercise per Week:   . Minutes of Exercise per Session:   Stress:   . Feeling of Stress :   Social Connections:   . Frequency of Communication with Friends and Family:   . Frequency of Social Gatherings with Friends and Family:   . Attends Religious Services:   . Active Member of Clubs or Organizations:   . Attends Archivist Meetings:   Marland Kitchen Marital Status:     His Allergies Are:  No Known Allergies:   His Current Medications Are:  Outpatient Encounter Medications as of 12/10/2019  Medication Sig  . atorvastatin (LIPITOR) 80 MG tablet TAKE 1 TABLET BY MOUTH EVERY DAY AT 6PM  . clopidogrel (PLAVIX) 75 MG tablet Take 1 tablet (75 mg total) by mouth daily.  Marland Kitchen lisinopril (ZESTRIL) 2.5 MG tablet Take 1 tablet (2.5 mg total) by mouth daily.  . [DISCONTINUED] GAVILYTE-N WITH FLAVOR PACK 420 g solution See admin instructions.   No facility-administered encounter medications on file as of 12/10/2019.  :  Review of Systems:  Out of a complete 14 point review of systems, all are reviewed and negative with the exception of these symptoms as listed below: Review of Systems  Neurological:       Pt presents today to discuss his bipap.  Only concern is regarding leaking (see HPI) but otherwise no concerns    Epworth Sleepiness Scale How likely are you to doze in the following situations:  0 = not likely, 1 = slight chance, 2 = moderate chance, 3 = high chance   Sitting and Reading? 1 Watching Television? 1 Sitting inactive in a public place (theater or meeting)? 0 Lying down in the afternoon when circumstances permit? 1 Sitting and talking to someone? 0 Sitting quietly after lunch without alcohol? 1 In a car, while stopped for a few minutes in traffic? 0 As a passenger in a car for an hour without a break? 0  Total = 4    Objective:    Today's Vitals   12/10/19 0729    BP: 135/77  Pulse: 62  Temp: 97.7 F (36.5 C)  Weight: 160 lb (72.6 kg)  Height: 5' 6"  (1.676 m)   Body mass index is 25.82 kg/m.   General: well developed, well nourished,  pleasant elderly Caucasian male, seated, in no evident distress Head: head normocephalic and atraumatic.   Neck: supple with no carotid or supraclavicular bruits Cardiovascular: regular rate and rhythm, no murmurs Musculoskeletal: no deformity Skin:  no rash/petichiae Vascular:  Normal pulses all extremities   Neurologic Exam Mental Status: Awake and fully alert.  Normal speech and language.  Oriented to place and time. Recent and remote memory intact. Attention span, concentration and fund of knowledge appropriate. Mood and affect appropriate.  Cranial Nerves: Pupils equal, briskly reactive to light. Extraocular movements full without nystagmus. Visual fields full to confrontation. Hearing intact. Facial sensation intact. Face, tongue, palate moves normally and symmetrically.  Motor: Normal bulk and tone. Normal strength in all tested extremity muscles. Sensory.: intact to touch , pinprick , position and vibratory sensation.  Coordination: Rapid alternating movements normal in all extremities. Finger-to-nose and heel-to-shin performed accurately bilaterally. Gait and Station: Arises from chair without difficulty. Stance is normal. Gait demonstrates normal stride length and balance Reflexes: 1+ and symmetric. Toes downgoing.    Assessmentand Plan:  In summary,James H Collieris a very pleasant 36 year oldmalewith an underlying medical history of diabetes, right pontine stroke 2019, hyperlipidemia, andborderlineoverweight state, as well as severe OSA on BiPAP evidenced on split-night study in 12/2017 with Dr. Rexene Alberts.   Optimal residual AHI and recommend continued current pressure at 17/13.  He will continue to follow with DME company, [employer], for any BiPAP related concerns or questions as well as  advised to speak with DME company regarding mask leakage every 2 to 3 weeks and possible different seal options.  Continue Plavix and atorvastatin for stroke prevention as well as ongoing follow-up with PCP for chronic disease management and secondary stroke risk factor management.  Follow-up in 1 year with Dr. Rexene Alberts or call earlier if needed  I spent 21 minutes of face-to-face and non-face-to-face time with patient.  This included previsit chart review, lab review, study review, order entry, electronic health record documentation, patient education regarding ongoing compliance of BiPAP and secondary stroke risk factor management and answered all questions to patient satisfaction    [name], [facility]  [facility] [address] [place], [place] [address]  Phone [phone] Fax [phone] Note: This document was prepared with digital dictation and possible smart phrase technology. Any transcriptional errors that result from this process are unintentional.  I reviewed the above note and documentation by the Nurse Practitioner and agree with the history, exam, assessment and plan as outlined above. I was available for consultation. Star Age, MD, PhD [facility])

## 2020-02-20 ENCOUNTER — Emergency Department (HOSPITAL_COMMUNITY)
Admission: EM | Admit: 2020-02-20 | Discharge: 2020-02-20 | Disposition: A | Discharge status: Home / Self Care | Payer: Medicare Other | Attending: Emergency Medicine | Admitting: Emergency Medicine

## 2020-02-20 ENCOUNTER — Encounter (HOSPITAL_COMMUNITY): Payer: Self-pay | Admitting: *Deleted

## 2020-02-20 ENCOUNTER — Other Ambulatory Visit: Payer: Self-pay

## 2020-02-20 DIAGNOSIS — I10 Essential (primary) hypertension: Secondary | ICD-10-CM | POA: Diagnosis not present

## 2020-02-20 DIAGNOSIS — T2010XA Burn of first degree of head, face, and neck, unspecified site, initial encounter: Secondary | ICD-10-CM

## 2020-02-20 DIAGNOSIS — Y92009 Unspecified place in unspecified non-institutional (private) residence as the place of occurrence of the external cause: Secondary | ICD-10-CM | POA: Insufficient documentation

## 2020-02-20 DIAGNOSIS — E119 Type 2 diabetes mellitus without complications: Secondary | ICD-10-CM | POA: Diagnosis not present

## 2020-02-20 DIAGNOSIS — F17200 Nicotine dependence, unspecified, uncomplicated: Secondary | ICD-10-CM | POA: Insufficient documentation

## 2020-02-20 DIAGNOSIS — X04XXXA Exposure to ignition of highly flammable material, initial encounter: Secondary | ICD-10-CM | POA: Insufficient documentation

## 2020-02-20 DIAGNOSIS — Y939 Activity, unspecified: Secondary | ICD-10-CM | POA: Diagnosis not present

## 2020-02-20 DIAGNOSIS — Y998 Other external cause status: Secondary | ICD-10-CM | POA: Diagnosis not present

## 2020-02-20 DIAGNOSIS — T310 Burns involving less than 10% of body surface: Secondary | ICD-10-CM | POA: Diagnosis not present

## 2020-02-20 MED ORDER — ACETAMINOPHEN 500 MG PO TABS
1000.0000 mg | ORAL_TABLET | Freq: Once | ORAL | Status: AC
  Administered 2020-02-20: 1000 mg via ORAL
  Filled 2020-02-20: qty 2

## 2020-02-20 NOTE — Discharge Instructions (Signed)
Keep cool fluids using a towel as needed.  Watch for signs of infection. Return for breathing difficulties or new concerns.  Tylenol as needed for pain every 4 hours.
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
## 2020-02-20 NOTE — ED Notes (Signed)
Pt given water to drink. 
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
## 2020-03-27 ENCOUNTER — Telehealth: Payer: Self-pay | Admitting: Family Medicine

## 2020-03-27 DIAGNOSIS — R739 Hyperglycemia, unspecified: Secondary | ICD-10-CM

## 2020-03-27 DIAGNOSIS — E785 Hyperlipidemia, unspecified: Secondary | ICD-10-CM

## 2020-03-27 DIAGNOSIS — Z8673 Personal history of transient ischemic attack (TIA), and cerebral infarction without residual deficits: Secondary | ICD-10-CM

## 2020-03-27 NOTE — Telephone Encounter (Signed)
Pt's wife, Jeani Hawking, is requesting to speak to you and getting an okay to for him to come in before his appt in September for labs. Pt's wife, Jeani Hawking is not wanting to come in before his appt because he has good insurance and they will pay for his labs. Pt's wife, Jeani Hawking, says, he always 1st to get his labs and sees Dr. Elease Hashimoto, a few days after for the results.

## 2020-03-28 NOTE — Addendum Note (Signed)
Addended by: [name] on: 03/28/2020 12:42 PM   Modules accepted: Orders

## 2020-03-28 NOTE — Telephone Encounter (Signed)
Please advise and if okay need labs ordered

## 2020-03-28 NOTE — Telephone Encounter (Signed)
I placed future order for labs and he needs to set up a lab appointment sometime around the first week of September for those

## 2020-03-28 NOTE — Telephone Encounter (Signed)
Lvm for pt to set up lab appt a week before appt orders have been placed

## 2020-05-15 ENCOUNTER — Other Ambulatory Visit: Payer: Self-pay | Admitting: Family Medicine

## 2020-05-26 ENCOUNTER — Other Ambulatory Visit: Payer: Self-pay

## 2020-05-26 ENCOUNTER — Other Ambulatory Visit (INDEPENDENT_AMBULATORY_CARE_PROVIDER_SITE_OTHER): Payer: Medicare Other

## 2020-05-26 ENCOUNTER — Ambulatory Visit: Payer: Medicare Other | Admitting: Family Medicine

## 2020-05-26 DIAGNOSIS — R739 Hyperglycemia, unspecified: Secondary | ICD-10-CM | POA: Diagnosis not present

## 2020-05-27 LAB — HEMOGLOBIN A1C
Hgb A1c MFr Bld: 6.3 % of total Hgb — ABNORMAL HIGH (ref <5.7)
Mean Plasma Glucose: 134 (calc)
eAG (mmol/L): 7.4 (calc)

## 2020-05-29 ENCOUNTER — Other Ambulatory Visit: Payer: Self-pay

## 2020-05-30 ENCOUNTER — Ambulatory Visit (INDEPENDENT_AMBULATORY_CARE_PROVIDER_SITE_OTHER): Payer: Medicare Other | Admitting: Family Medicine

## 2020-05-30 ENCOUNTER — Encounter: Payer: Self-pay | Admitting: Family Medicine

## 2020-05-30 VITALS — BP 138/60 | HR 62 | Temp 98.2°F | Ht 66.0 in | Wt 153.0 lb

## 2020-05-30 DIAGNOSIS — Z8673 Personal history of transient ischemic attack (TIA), and cerebral infarction without residual deficits: Secondary | ICD-10-CM | POA: Diagnosis not present

## 2020-05-30 DIAGNOSIS — E119 Type 2 diabetes mellitus without complications: Secondary | ICD-10-CM | POA: Diagnosis not present

## 2020-05-30 DIAGNOSIS — N529 Male erectile dysfunction, unspecified: Secondary | ICD-10-CM

## 2020-05-30 DIAGNOSIS — Z125 Encounter for screening for malignant neoplasm of prostate: Secondary | ICD-10-CM

## 2020-05-30 DIAGNOSIS — E785 Hyperlipidemia, unspecified: Secondary | ICD-10-CM | POA: Diagnosis not present

## 2020-05-30 DIAGNOSIS — I10 Essential (primary) hypertension: Secondary | ICD-10-CM

## 2020-05-30 DIAGNOSIS — Z23 Encounter for immunization: Secondary | ICD-10-CM | POA: Diagnosis not present

## 2020-05-30 MED ORDER — SILDENAFIL CITRATE 100 MG PO TABS: 50.0000 mg | ORAL_TABLET | Freq: Every day | ORAL | 11 refills | Status: DC | PRN

## 2020-05-30 NOTE — Addendum Note (Signed)
Addended by: Marrion Coy on: 05/30/2020 08:45 AM   Modules accepted: Orders

## 2020-05-30 NOTE — Addendum Note (Signed)
Addended by: Marrion Coy on: 05/30/2020 08:40 AM   Modules accepted: Orders

## 2020-05-30 NOTE — Progress Notes (Signed)
Established Patient Office Visit  Subjective:  Patient ID: James Pugh, male    DOB: 07-14-1946  Age: 74 y.o. MRN: [id number]  CC:  Chief Complaint  Patient presents with  . Follow-up    HPI James Pugh presents for medical follow-up.  He has history of CVA, hypertension, dyslipidemia, obstructive sleep apnea, type 2 diabetes.  Diabetes currently not managed with medication.  Last A1c 6.4%.  Not monitoring blood sugars regularly.  No polyuria or polydipsia.  No recent neurologic symptoms.  No chest pains.  Medications reviewed.  He takes low-dose lisinopril, Plavix, Lipitor.  He does complain of some erectile dysfunction.  He would like to explore medications for that.  No nitroglycerin use.  He is also requesting PSA.  Last PSA was 0.77 back in 2018.  We discussed [place] preventive task force recommendations regarding PSA but he would still like to get this checked.  Past Medical History:  Diagnosis Date  . CERUMEN IMPACTION 08/22/2009  . HYPERLIPIDEMIA 06/06/2009  . PREDIABETES 07/13/2010  . Stroke [facility])     Past Surgical History:  Procedure Laterality Date  . TONSILLECTOMY      Family History  Problem Relation Age of Onset  . Stroke Father   . Heart disease Father   . Diabetes Father        type ll  . Alcohol abuse Other   . Stroke Brother     Social History   Socioeconomic History  . Marital status: Married    Spouse name: Not on file  . Number of children: Not on file  . Years of education: Not on file  . Highest education level: Not on file  Occupational History  . Not on file  Tobacco Use  . Smoking status: Former Smoker    Packs/day: 1.00    Years: 20.00    Pack years: 20.00    Types: Cigarettes    Quit date: 08/11/1983    Years since quitting: 36.8  . Smokeless tobacco: Never Used  Vaping Use  . Vaping Use: Never used  Substance and Sexual Activity  . Alcohol use: No  . Drug use: No  . Sexual activity: Not on file  Other  Topics Concern  . Not on file  Social History Narrative  . Not on file   Social Determinants of Health   Financial Resource Strain:   . Difficulty of Paying Living Expenses: Not on file  Food Insecurity:   . Worried About Charity fundraiser in the Last Year: Not on file  . Ran Out of Food in the Last Year: Not on file  Transportation Needs:   . Lack of Transportation (Medical): Not on file  . Lack of Transportation (Non-Medical): Not on file  Physical Activity:   . Days of Exercise per Week: Not on file  . Minutes of Exercise per Session: Not on file  Stress:   . Feeling of Stress : Not on file  Social Connections:   . Frequency of Communication with Friends and Family: Not on file  . Frequency of Social Gatherings with Friends and Family: Not on file  . Attends Religious Services: Not on file  . Active Member of Clubs or Organizations: Not on file  . Attends Archivist Meetings: Not on file  . Marital Status: Not on file  Intimate Partner Violence:   . Fear of Current or Ex-Partner: Not on file  . Emotionally Abused: Not on file  . Physically Abused:  Not on file  . Sexually Abused: Not on file    Outpatient Medications Prior to Visit  Medication Sig Dispense Refill  . atorvastatin (LIPITOR) 80 MG tablet TAKE 1 TABLET BY MOUTH EVERY DAY AT 6PM 90 tablet 0  . clopidogrel (PLAVIX) 75 MG tablet TAKE 1 TABLET BY MOUTH EVERY DAY 90 tablet 0  . lisinopril (ZESTRIL) 2.5 MG tablet TAKE 1 TABLET BY MOUTH EVERY DAY 90 tablet 0   No facility-administered medications prior to visit.    No Known Allergies  ROS Review of Systems  Constitutional: Negative for fatigue and unexpected weight change.  Eyes: Negative for visual disturbance.  Respiratory: Negative for cough, chest tightness and shortness of breath.   Cardiovascular: Negative for chest pain, palpitations and leg swelling.  Endocrine: Negative for polydipsia and polyuria.  Neurological: Negative for dizziness,  syncope, weakness, light-headedness and headaches.      Objective:    Physical Exam Vitals reviewed.  Constitutional:      Appearance: Normal appearance.  Cardiovascular:     Rate and Rhythm: Normal rate and regular rhythm.  Pulmonary:     Effort: Pulmonary effort is normal.     Breath sounds: Normal breath sounds.  Musculoskeletal:     Right lower leg: No edema.     Left lower leg: No edema.  Neurological:     Mental Status: He is alert.     BP 138/60   Pulse 62   Temp 98.2 F (36.8 C) (Oral)   Ht 5\' 6"  (1.676 m)   Wt 153 lb (69.4 kg)   SpO2 98%   BMI 24.69 kg/m  Wt Readings from Last 3 Encounters:  05/30/20 153 lb (69.4 kg)  02/20/20 161 lb (73 kg)  12/10/19 160 lb (72.6 kg)     Health Maintenance Due  Topic Date Due  . COVID-19 Vaccine (1) Never done  . INFLUENZA VACCINE  04/13/2020    There are no preventive care reminders to display for this patient.  Lab Results  Component Value Date   TSH 0.318 (L) 05/24/2018   Lab Results  Component Value Date   WBC 11.0 (H) 05/24/2018   HGB 13.2 05/24/2018   HCT 40.4 05/24/2018   MCV 91.2 05/24/2018   PLT 284 05/24/2018   Lab Results  Component Value Date   NA 139 05/14/2019   K 4.5 05/14/2019   CO2 30 05/14/2019   GLUCOSE 102 (H) 05/14/2019   BUN 16 05/14/2019   CREATININE 0.84 05/14/2019   BILITOT 0.5 05/14/2019   ALKPHOS 48 05/14/2019   AST 21 05/14/2019   ALT 29 05/14/2019   PROT 6.9 05/14/2019   ALBUMIN 4.3 05/14/2019   CALCIUM 9.9 05/14/2019   ANIONGAP 9 05/24/2018   GFR 89.09 05/14/2019   Lab Results  Component Value Date   CHOL 130 05/14/2019   Lab Results  Component Value Date   HDL 40.30 05/14/2019   Lab Results  Component Value Date   LDLCALC 74 05/14/2019   Lab Results  Component Value Date   TRIG 78.0 05/14/2019   Lab Results  Component Value Date   CHOLHDL 3 05/14/2019   Lab Results  Component Value Date   HGBA1C 6.3 (H) 05/26/2020      Assessment & Plan:    #1 history of CVA.  Patient remains on Plavix.  Needs follow-up lipid with goal LDL less than 70.  Recent A1c 6.3%  #2 type 2 diabetes well controlled without medication with recent A1c 6.3% -Continue  monitoring every 6 months -Continue yearly eye exam  #3 erectile dysfunction.  Patient requesting medication options  -Generic Viagra 100 mg 1/2 to 1 tablet once daily as needed.  He does not use any nitroglycerin.  Reviewed potential side effects.  #4 dyslipidemia.  Goal LDL less than 70 -Recheck fasting lipid and hepatic panel  #5 hypertension stable on low-dose lisinopril  #6 health maintenance -Flu vaccine given -Patient plans to get Covid booster when available -He had questions regarding Shingrix vaccine.  He had previous Zostavax.  We recommend he check with pharmacy regarding coverage  Meds ordered this encounter  Medications  . sildenafil (VIAGRA) 100 MG tablet    Sig: Take 0.5-1 tablets (50-100 mg total) by mouth daily as needed for erectile dysfunction.    Dispense:  6 tablet    Refill:  11    Follow-up: Return in about 6 months (around 11/27/2020).    Carolann Littler, MD

## 2020-05-31 LAB — HEPATIC FUNCTION PANEL
AG Ratio: 1.6 (calc) (ref 1.0–2.5)
ALT: 18 U/L (ref 9–46)
AST: 15 U/L (ref 10–35)
Albumin: 3.9 g/dL (ref 3.6–5.1)
Alkaline phosphatase (APISO): 46 U/L (ref 35–144)
Bilirubin, Direct: 0.1 mg/dL (ref 0.0–0.2)
Globulin: 2.5 g/dL (calc) (ref 1.9–3.7)
Indirect Bilirubin: 0.5 mg/dL (calc) (ref 0.2–1.2)
Total Bilirubin: 0.6 mg/dL (ref 0.2–1.2)
Total Protein: 6.4 g/dL (ref 6.1–8.1)

## 2020-05-31 LAB — LIPID PANEL
Cholesterol: 122 mg/dL (ref <200)
HDL: 39 mg/dL — ABNORMAL LOW (ref >=40)
LDL Cholesterol (Calc): 70 mg/dL (calc)
Non-HDL Cholesterol (Calc): 83 mg/dL (calc) (ref <130)
Total CHOL/HDL Ratio: 3.1 (calc) (ref <5.0)
Triglycerides: 57 mg/dL (ref <150)

## 2020-05-31 LAB — BASIC METABOLIC PANEL
BUN: 15 mg/dL (ref 7–25)
CO2: 28 mmol/L (ref 20–32)
Calcium: 10 mg/dL (ref 8.6–10.3)
Chloride: 101 mmol/L (ref 98–110)
Creat: 0.89 mg/dL (ref 0.70–1.18)
Glucose, Bld: 114 mg/dL — ABNORMAL HIGH (ref 65–99)
Potassium: 4.8 mmol/L (ref 3.5–5.3)
Sodium: 137 mmol/L (ref 135–146)

## 2020-05-31 LAB — PSA: PSA: 0.48 ng/mL (ref <=4.0)

## 2020-06-25 ENCOUNTER — Other Ambulatory Visit: Payer: Medicare Other

## 2020-06-25 DIAGNOSIS — Z20822 Contact with and (suspected) exposure to covid-19: Secondary | ICD-10-CM

## 2020-06-25 DIAGNOSIS — H6123 Impacted cerumen, bilateral: Secondary | ICD-10-CM | POA: Diagnosis not present

## 2020-06-26 DIAGNOSIS — H9313 Tinnitus, bilateral: Secondary | ICD-10-CM | POA: Diagnosis not present

## 2020-06-26 DIAGNOSIS — H903 Sensorineural hearing loss, bilateral: Secondary | ICD-10-CM | POA: Diagnosis not present

## 2020-06-26 LAB — NOVEL CORONAVIRUS, NAA: SARS-CoV-2, NAA: NOT DETECTED

## 2020-06-26 LAB — SARS-COV-2, NAA 2 DAY TAT

## 2020-06-28 ENCOUNTER — Ambulatory Visit: Payer: Medicare Other

## 2020-06-28 ENCOUNTER — Ambulatory Visit: Payer: Medicare Other | Attending: Internal Medicine

## 2020-06-28 DIAGNOSIS — Z23 Encounter for immunization: Secondary | ICD-10-CM

## 2020-06-28 NOTE — Progress Notes (Signed)
   Covid-19 Vaccination Clinic  Name:  James Pugh    MRN: [id number] DOB: Feb 20, 1946  06/28/2020  James Pugh was observed post Covid-19 immunization for 15 minutes without incident. He was provided with Vaccine Information Sheet and instruction to access the V-Safe system.   James Pugh was instructed to call 911 with any severe reactions post vaccine: Marland Kitchen Difficulty breathing  . Swelling of face and throat  . A fast heartbeat  . A bad rash all over body  . Dizziness and weakness

## 2020-08-04 DIAGNOSIS — Z961 Presence of intraocular lens: Secondary | ICD-10-CM | POA: Diagnosis not present

## 2020-08-04 DIAGNOSIS — H53001 Unspecified amblyopia, right eye: Secondary | ICD-10-CM | POA: Diagnosis not present

## 2020-08-04 DIAGNOSIS — H52203 Unspecified astigmatism, bilateral: Secondary | ICD-10-CM | POA: Diagnosis not present

## 2020-08-18 ENCOUNTER — Other Ambulatory Visit: Payer: Self-pay | Admitting: Family Medicine

## 2020-08-26 ENCOUNTER — Other Ambulatory Visit: Payer: Self-pay

## 2020-08-26 ENCOUNTER — Ambulatory Visit (INDEPENDENT_AMBULATORY_CARE_PROVIDER_SITE_OTHER): Payer: Medicare Other

## 2020-08-26 DIAGNOSIS — Z0000 Encounter for general adult medical examination without abnormal findings: Secondary | ICD-10-CM

## 2020-08-26 NOTE — Patient Instructions (Addendum)
James Pugh , Thank you for taking time to come for your Medicare Wellness Visit. I appreciate your ongoing commitment to your health goals. Please review the following plan we discussed and let me know if I can assist you in the future.   Screening recommendations/referrals: Colonoscopy: Done 05/10/20 Recommended yearly ophthalmology/optometry visit for glaucoma screening and checkup Recommended yearly dental visit for hygiene and checkup  Vaccinations: Influenza vaccine: Done 05/30/20 Pneumococcal vaccine:Up to date Tdap vaccine: Up to date Shingles vaccine: Shingrix discussed. Please contact your pharmacy for coverage information.    Covid-19: Completed 2/2, 2/23, & 06/28/20  Advanced directives: Please bring a copy of your health care power of attorney and living will to the office at your convenience.  Conditions/risks identified: Maintain health   Next appointment: Follow up in one year for your annual wellness visit.   Preventive Care 20 Years and Older, Male Preventive care refers to lifestyle choices and visits with your health care provider that can promote health and wellness. What does preventive care include?  A yearly physical exam. This is also called an annual well check.  Dental exams once or twice a year.  Routine eye exams. Ask your health care provider how often you should have your eyes checked.  Personal lifestyle choices, including:  Daily care of your teeth and gums.  Regular physical activity.  Eating a healthy diet.  Avoiding tobacco and drug use.  Limiting alcohol use.  Practicing safe sex.  Taking low doses of aspirin every day.  Taking vitamin and mineral supplements as recommended by your health care provider. What happens during an annual well check? The services and screenings done by your health care provider during your annual well check will depend on your age, overall health, lifestyle risk factors, and family history of  disease. Counseling  Your health care provider may ask you questions about your:  Alcohol use.  Tobacco use.  Drug use.  Emotional well-being.  Home and relationship well-being.  Sexual activity.  Eating habits.  History of falls.  Memory and ability to understand (cognition).  Work and work Statistician. Screening  You may have the following tests or measurements:  Height, weight, and BMI.  Blood pressure.  Lipid and cholesterol levels. These may be checked every 5 years, or more frequently if you are over 38 years old.  Skin check.  Lung cancer screening. You may have this screening every year starting at age 50 if you have a 30-pack-year history of smoking and currently smoke or have quit within the past 15 years.  Fecal occult blood test (FOBT) of the stool. You may have this test every year starting at age 49.  Flexible sigmoidoscopy or colonoscopy. You may have a sigmoidoscopy every 5 years or a colonoscopy every 10 years starting at age 109.  Prostate cancer screening. Recommendations will vary depending on your family history and other risks.  Hepatitis C blood test.  Hepatitis B blood test.  Sexually transmitted disease (STD) testing.  Diabetes screening. This is done by checking your blood sugar (glucose) after you have not eaten for a while (fasting). You may have this done every 1-3 years.  Abdominal aortic aneurysm (AAA) screening. You may need this if you are a current or former smoker.  Osteoporosis. You may be screened starting at age 64 if you are at high risk. Talk with your health care provider about your test results, treatment options, and if necessary, the need for more tests. Vaccines  Your health care  provider may recommend certain vaccines, such as:  Influenza vaccine. This is recommended every year.  Tetanus, diphtheria, and acellular pertussis (Tdap, Td) vaccine. You may need a Td booster every 10 years.  Zoster vaccine. You may  need this after age 60.  Pneumococcal 13-valent conjugate (PCV13) vaccine. One dose is recommended after age 1.  Pneumococcal polysaccharide (PPSV23) vaccine. One dose is recommended after age 37. Talk to your health care provider about which screenings and vaccines you need and how often you need them. This information is not intended to replace advice given to you by your health care provider. Make sure you discuss any questions you have with your health care provider. Document Released: 09/26/2015 Document Revised: 05/19/2016 Document Reviewed: 07/01/2015 Elsevier Interactive Patient Education  2017 [place] Prevention in the Home Falls can cause injuries. They can happen to people of all ages. There are many things you can do to make your home safe and to help prevent falls. What can I do on the outside of my home?  Regularly fix the edges of walkways and driveways and fix any cracks.  Remove anything that might make you trip as you walk through a door, such as a raised step or threshold.  Trim any bushes or trees on the path to your home.  Use bright outdoor lighting.  Clear any walking paths of anything that might make someone trip, such as rocks or tools.  Regularly check to see if handrails are loose or broken. Make sure that both sides of any steps have handrails.  Any raised decks and porches should have guardrails on the edges.  Have any leaves, snow, or ice cleared regularly.  Use sand or salt on walking paths during winter.  Clean up any spills in your garage right away. This includes oil or grease spills. What can I do in the bathroom?  Use night lights.  Install grab bars by the toilet and in the tub and shower. Do not use towel bars as grab bars.  Use non-skid mats or decals in the tub or shower.  If you need to sit down in the shower, use a plastic, non-slip stool.  Keep the floor dry. Clean up any water that spills on the floor as soon as it  happens.  Remove soap buildup in the tub or shower regularly.  Attach bath mats securely with double-sided non-slip rug tape.  Do not have throw rugs and other things on the floor that can make you trip. What can I do in the bedroom?  Use night lights.  Make sure that you have a light by your bed that is easy to reach.  Do not use any sheets or blankets that are too big for your bed. They should not hang down onto the floor.  Have a firm chair that has side arms. You can use this for support while you get dressed.  Do not have throw rugs and other things on the floor that can make you trip. What can I do in the kitchen?  Clean up any spills right away.  Avoid walking on wet floors.  Keep items that you use a lot in easy-to-reach places.  If you need to reach something above you, use a strong step stool that has a grab bar.  Keep electrical cords out of the way.  Do not use floor polish or wax that makes floors slippery. If you must use wax, use non-skid floor wax.  Do not have throw  rugs and other things on the floor that can make you trip. What can I do with my stairs?  Do not leave any items on the stairs.  Make sure that there are handrails on both sides of the stairs and use them. Fix handrails that are broken or loose. Make sure that handrails are as long as the stairways.  Check any carpeting to make sure that it is firmly attached to the stairs. Fix any carpet that is loose or worn.  Avoid having throw rugs at the top or bottom of the stairs. If you do have throw rugs, attach them to the floor with carpet tape.  Make sure that you have a light switch at the top of the stairs and the bottom of the stairs. If you do not have them, ask someone to add them for you. What else can I do to help prevent falls?  Wear shoes that:  Do not have high heels.  Have rubber bottoms.  Are comfortable and fit you well.  Are closed at the toe. Do not wear sandals.  If you  use a stepladder:  Make sure that it is fully opened. Do not climb a closed stepladder.  Make sure that both sides of the stepladder are locked into place.  Ask someone to hold it for you, if possible.  Clearly mark and make sure that you can see:  Any grab bars or handrails.  First and last steps.  Where the edge of each step is.  Use tools that help you move around (mobility aids) if they are needed. These include:  Canes.  Walkers.  Scooters.  Crutches.  Turn on the lights when you go into a dark area. Replace any light bulbs as soon as they burn out.  Set up your furniture so you have a clear path. Avoid moving your furniture around.  If any of your floors are uneven, fix them.  If there are any pets around you, be aware of where they are.  Review your medicines with your doctor. Some medicines can make you feel dizzy. This can increase your chance of falling. Ask your doctor what other things that you can do to help prevent falls. This information is not intended to replace advice given to you by your health care provider. Make sure you discuss any questions you have with your health care provider. Document Released: 06/26/2009 Document Revised: 02/05/2016 Document Reviewed: 10/04/2014 Elsevier Interactive Patient Education  2017 [employer].

## 2020-08-26 NOTE — Progress Notes (Signed)
Subjective:   James Pugh is a 74 y.o. male who presents for Medicare Annual/Subsequent preventive examination.  Review of Systems           Objective:    There were no vitals filed for this visit. There is no height or weight on file to calculate BMI.  Advanced Directives 08/26/2020 05/24/2018 05/24/2018 11/08/2017 11/07/2017 10/24/2017 10/23/2017  Does Patient Have a Medical Advance Directive? Yes Yes Yes Yes Yes Yes No  Type of Paramedic of [place];Living will [place] will Living will Living will -  Does patient want to make changes to medical advance directive? - No - Patient declined No - Patient declined - - Yes (Inpatient - patient defers changing a medical advance directive at this time) -  Copy of [place] in Chart? No - copy requested - - - - - -  Would patient like information on creating a medical advance directive? - - - - - No - Patient declined No - Patient declined    Current Medications (verified) Outpatient Encounter Medications as of 08/26/2020  Medication Sig  . atorvastatin (LIPITOR) 80 MG tablet TAKE 1 TABLET BY MOUTH EVERY DAY AT 6PM  . clopidogrel (PLAVIX) 75 MG tablet TAKE 1 TABLET BY MOUTH EVERY DAY  . lisinopril (ZESTRIL) 2.5 MG tablet TAKE 1 TABLET BY MOUTH EVERY DAY  . sildenafil (VIAGRA) 100 MG tablet Take 0.5-1 tablets (50-100 mg total) by mouth daily as needed for erectile dysfunction. (Patient not taking: Reported on 08/26/2020)   No facility-administered encounter medications on file as of 08/26/2020.    Allergies (verified) Patient has no known allergies.   History: Past Medical History:  Diagnosis Date  . CERUMEN IMPACTION 08/22/2009  . HYPERLIPIDEMIA 06/06/2009  . PREDIABETES 07/13/2010  . Stroke [facility])    Past Surgical History:  Procedure Laterality Date  . TONSILLECTOMY     Family History  Problem Relation Age of Onset  . Stroke Father   . Heart disease  Father   . Diabetes Father        type ll  . Alcohol abuse Other   . Stroke Brother    Social History   Socioeconomic History  . Marital status: Married    Spouse name: Not on file  . Number of children: Not on file  . Years of education: Not on file  . Highest education level: Not on file  Occupational History  . Occupation: Retired  Tobacco Use  . Smoking status: Former Smoker    Packs/day: 1.00    Years: 20.00    Pack years: 20.00    Types: Cigarettes    Quit date: 08/11/1983    Years since quitting: 37.0  . Smokeless tobacco: Never Used  Vaping Use  . Vaping Use: Never used  Substance and Sexual Activity  . Alcohol use: No  . Drug use: No  . Sexual activity: Not on file  Other Topics Concern  . Not on file  Social History Narrative  . Not on file   Social Determinants of Health   Financial Resource Strain: Low Risk   . Difficulty of Paying Living Expenses: Not hard at all  Food Insecurity: No Food Insecurity  . Worried About Charity fundraiser in the Last Year: Never true  . Ran Out of Food in the Last Year: Never true  Transportation Needs: No Transportation Needs  . Lack of Transportation (Medical): No  . Lack of  Transportation (Non-Medical): No  Physical Activity: Inactive  . Days of Exercise per Week: 0 days  . Minutes of Exercise per Session: 0 min  Stress: No Stress Concern Present  . Feeling of Stress : Not at all  Social Connections: Moderately Integrated  . Frequency of Communication with Friends and Family: Three times a week  . Frequency of Social Gatherings with Friends and Family: More than three times a week  . Attends Religious Services: More than 4 times per year  . Active Member of Clubs or Organizations: No  . Attends Archivist Meetings: Never  . Marital Status: Married    Tobacco Counseling Counseling given: Not Answered   Clinical Intake:  Pre-visit preparation completed: Yes  Pain : No/denies pain     BMI -  recorded: 24.71 Nutritional Status: BMI of 19-24  Normal Nutritional Risks: None Diabetes: Yes CBG done?: No Did pt. bring in CBG monitor from home?: No  How often do you need to have someone help you when you read instructions, pamphlets, or other written materials from your doctor or pharmacy?: 1 - Never  Diabetic?Nutrition Risk Assessment:  Has the patient had any N/V/D within the last 2 months?  No  Does the patient have any non-healing wounds?  No  Has the patient had any unintentional weight loss or weight gain?  No   Diabetes:  Is the patient diabetic?  Yes  If diabetic, was a CBG obtained today?  No  Did the patient bring in their glucometer from home?  No  How often do you monitor your CBG's? Not taking .   Financial Strains and Diabetes Management:  Are you having any financial strains with the device, your supplies or your medication? No .  Does the patient want to be seen by Chronic Care Management for management of their diabetes?  No  Would the patient like to be referred to a Nutritionist or for Diabetic Management?  No   Diabetic Exams:  Diabetic Eye Exam: Completed 02/19/20 Diabetic Foot Exam: Completed 05/30/20   Interpreter Needed?: No  Information entered by :: Charlott Rakes, LPN   Activities of Daily Living In your present state of health, do you have any difficulty performing the following activities: 08/26/2020  Hearing? Y  Vision? N  Difficulty concentrating or making decisions? N  Walking or climbing stairs? N  Dressing or bathing? N  Doing errands, shopping? N  Preparing Food and eating ? N  Using the Toilet? N  In the past six months, have you accidently leaked urine? N  Do you have problems with loss of bowel control? N  Managing your Medications? N  Managing your Finances? N  Housekeeping or managing your Housekeeping? N  Some recent data might be hidden    Patient Care Team: [name], MD as PCP - General Duffy, Creola Corn,  LCSW as Social Worker (Licensed Clinical Social Worker)  Indicate any recent [employer] you may have received from other than [facility] providers in the past year (date may be approximate).     Assessment:   This is a routine wellness examination for James Pugh.  Hearing/Vision screen  Hearing Screening   125Hz  250Hz  500Hz  1000Hz  2000Hz  3000Hz  4000Hz  6000Hz  8000Hz   Right ear:           Left ear:           Comments: Loss high frequency hearing   Vision Screening Comments: Pt follows up Dr Katy Apo  for annual eye  exams  Dietary issues and exercise activities discussed:    Goals    . Patient Stated     Maintain healthy       Depression Screen PHQ 2/9 Scores 08/26/2020 05/14/2019 11/14/2017 10/26/2016 11/11/2014 11/11/2014 10/16/2013  PHQ - 2 Score 0 0 0 0 0 0 0  PHQ- 9 Score - 0 0 - - - -    Fall Risk Fall Risk  08/26/2020 05/14/2019 07/06/2018 01/03/2018 10/26/2016  Falls in the past year? 0 0 No No No  Number falls in past yr: 0 - - - -  Injury with Fall? 0 - - - -  Risk for fall due to : Impaired vision - - - -  Follow up Falls prevention discussed - - - -    FALL RISK PREVENTION PERTAINING TO THE HOME:  Any stairs in or around the home? Yes  If so, are there any without handrails? No  Home free of loose throw rugs in walkways, pet beds, electrical cords, etc? Yes  Adequate lighting in your home to reduce risk of falls? Yes   ASSISTIVE DEVICES UTILIZED TO PREVENT FALLS:  Life alert? No  Use of a cane, walker or w/c? No  Grab bars in the bathroom? No  Shower chair or bench in shower? Yes  Elevated toilet seat or a handicapped toilet? No   TIMED UP AND GO:  Was the test performed? Yes .  Length of time to ambulate 10 feet: 10 sec.   Gait slow and steady without use of assistive device  Cognitive Function:     6CIT Screen 08/26/2020  What Year? 0 points  What month? 0 points  Count back from 20 0 points  Months in reverse 0 points  Repeat phrase 0 points     Immunizations Immunization History  Administered Date(s) Administered  . Fluad Quad(high Dose 65+) 05/14/2019, 05/30/2020  . Hepatitis A 06/06/2009  . Influenza Split 08/11/2011, 06/21/2013  . Influenza Whole 06/06/2009, 07/13/2010  . Influenza, High Dose Seasonal PF 06/06/2018  . Influenza-Unspecified 08/02/2012  . PFIZER SARS-COV-2 Vaccination 10/16/2019, 11/06/2019, 06/28/2020  . Pneumococcal Conjugate-13 10/24/2013  . Pneumococcal Polysaccharide-23 06/06/2009  . Td 09/13/2004  . Tdap 11/11/2014    TDAP status: Up to date  Flu Vaccine status: Up to date  Pneumococcal vaccine status: Up to date  Covid-19 vaccine status: Completed vaccines  Qualifies for Shingles Vaccine? Yes   Zostavax completed No   Shingrix Completed?: No.    Education has been provided regarding the importance of this vaccine. Patient has been advised to call insurance company to determine out of pocket expense if they have not yet received this vaccine. Advised may also receive vaccine at local pharmacy or Health Dept. Verbalized acceptance and understanding.  Screening Tests Health Maintenance  Topic Date Due  . HEMOGLOBIN A1C  11/23/2020  . OPHTHALMOLOGY EXAM  02/18/2021  . FOOT EXAM  05/30/2021  . TETANUS/TDAP  11/10/2024  . INFLUENZA VACCINE  Completed  . COVID-19 Vaccine  Completed  . Hepatitis C Screening  Completed  . PNA vac Low Risk Adult  Completed    Health Maintenance  There are no preventive care reminders to display for this patient.  Colorectal cancer screening: Type of screening: Colonoscopy. Completed 05/11/19. Repeat every 0 years    Additional Screening:  Hepatitis C Screening:  Completed 07/28/16  Vision Screening: Recommended annual ophthalmology exams for early detection of glaucoma and other disorders of the eye. Is the patient up to date with  their annual eye exam?  Yes  Who is the provider or what is the name of the office in which the patient attends annual  eye exams? Dr Katy Apo   Dental Screening: Recommended annual dental exams for proper oral hygiene  Community Resource Referral / Chronic Care Management: CRR required this visit?  No   CCM required this visit?  No      Plan:     I have personally reviewed and noted the following in the patient's chart:   . Medical and social history . Use of alcohol, tobacco or illicit drugs  . Current medications and supplements . Functional ability and status . Nutritional status . Physical activity . Advanced directives . List of other physicians . Hospitalizations, surgeries, and ER visits in previous 12 months . Vitals . Screenings to include cognitive, depression, and falls . Referrals and appointments  In addition, I have reviewed and discussed with patient certain preventive protocols, quality metrics, and best practice recommendations. A written personalized care plan for preventive services as well as general preventive health recommendations were provided to patient.     Willette Brace, LPN   [id number]   Nurse Notes: None

## 2020-09-11 DIAGNOSIS — M13841 Other specified arthritis, right hand: Secondary | ICD-10-CM | POA: Diagnosis not present

## 2020-11-12 ENCOUNTER — Other Ambulatory Visit: Payer: Self-pay | Admitting: Family Medicine

## 2020-11-18 ENCOUNTER — Telehealth: Payer: Self-pay | Admitting: Family Medicine

## 2020-11-18 NOTE — Telephone Encounter (Signed)
Pt wife call and want to know if he can get his labs done before he come for his appt so dr.Burchette will have the result .She want a call back.

## 2020-11-18 NOTE — Telephone Encounter (Signed)
It looks like the only thing he needs at follow-up is an A1c and that can be done in office.  He had full set of labs otherwise done back in September

## 2020-11-19 NOTE — Telephone Encounter (Signed)
Spoke with wife about message.  Will call office to schedule f/u.

## 2020-11-25 ENCOUNTER — Other Ambulatory Visit: Payer: Self-pay

## 2020-11-26 ENCOUNTER — Telehealth: Payer: Self-pay | Admitting: Family Medicine

## 2020-11-26 ENCOUNTER — Ambulatory Visit (INDEPENDENT_AMBULATORY_CARE_PROVIDER_SITE_OTHER): Payer: Medicare Other | Admitting: Family Medicine

## 2020-11-26 ENCOUNTER — Encounter: Payer: Self-pay | Admitting: Family Medicine

## 2020-11-26 VITALS — BP 120/60 | HR 60 | Temp 97.8°F | Ht 66.0 in | Wt 160.1 lb

## 2020-11-26 DIAGNOSIS — I10 Essential (primary) hypertension: Secondary | ICD-10-CM

## 2020-11-26 DIAGNOSIS — R739 Hyperglycemia, unspecified: Secondary | ICD-10-CM

## 2020-11-26 DIAGNOSIS — E785 Hyperlipidemia, unspecified: Secondary | ICD-10-CM

## 2020-11-26 LAB — POCT GLYCOSYLATED HEMOGLOBIN (HGB A1C): Hemoglobin A1C: 6.6 % — AB (ref 4.0–5.6)

## 2020-11-26 MED ORDER — ATORVASTATIN CALCIUM 80 MG PO TABS: 80.0000 mg | ORAL_TABLET | Freq: Every day | ORAL | 2 refills | Status: DC

## 2020-11-26 MED ORDER — CLOPIDOGREL BISULFATE 75 MG PO TABS: 75.0000 mg | ORAL_TABLET | Freq: Every day | ORAL | 2 refills | Status: DC

## 2020-11-26 MED ORDER — LISINOPRIL 2.5 MG PO TABS: 2.5000 mg | ORAL_TABLET | Freq: Every day | ORAL | 2 refills | Status: DC

## 2020-11-26 NOTE — Telephone Encounter (Signed)
Patient wants to makes sure his prescriptions have been called in after his visit--  Atorvastatin Lisinopril Clopidogrel  [facility]

## 2020-11-26 NOTE — Progress Notes (Signed)
Established Patient Office Visit  Subjective:  Patient ID: James Pugh, male    DOB: 04-01-1946  Age: 75 y.o. MRN: [id number]  CC:  Chief Complaint  Patient presents with  . Follow-up    HPI James Pugh presents for medical follow-up.  He has history of CVA, obstructive sleep apnea, type 2 diabetes, dyslipidemia Generally doing well.  He has no specific complaints today.  His medications include lisinopril 2.5 mg daily, Plavix 75 mg daily, Lipitor 80 mg daily.  He has history of hyperglycemia but has been managed without medication.  He prefers to avoid additional medications if possible.  Home blood pressure stable.  LDL cholesterol 70 when checked last fall.  No significant myalgias.  Denies any recent chest pains.  No recent falls.  Stays very busy with home projects.  No recent new neurologic symptoms He states his diet is fairly good with exception that he has a craving for "sweets ".  Sometimes does drink sweetened tea.  Past Medical History:  Diagnosis Date  . CERUMEN IMPACTION 08/22/2009  . HYPERLIPIDEMIA 06/06/2009  . PREDIABETES 07/13/2010  . Stroke [facility])     Past Surgical History:  Procedure Laterality Date  . TONSILLECTOMY      Family History  Problem Relation Age of Onset  . Stroke Father   . Heart disease Father   . Diabetes Father        type ll  . Alcohol abuse Other   . Stroke Brother     Social History   Socioeconomic History  . Marital status: Married    Spouse name: Not on file  . Number of children: Not on file  . Years of education: Not on file  . Highest education level: Not on file  Occupational History  . Occupation: Retired  Tobacco Use  . Smoking status: Former Smoker    Packs/day: 1.00    Years: 20.00    Pack years: 20.00    Types: Cigarettes    Quit date: 08/11/1983    Years since quitting: 37.3  . Smokeless tobacco: Never Used  Vaping Use  . Vaping Use: Never used  Substance and Sexual Activity  . Alcohol use: No   . Drug use: No  . Sexual activity: Not on file  Other Topics Concern  . Not on file  Social History Narrative  . Not on file   Social Determinants of Health   Financial Resource Strain: Low Risk   . Difficulty of Paying Living Expenses: Not hard at all  Food Insecurity: No Food Insecurity  . Worried About Charity fundraiser in the Last Year: Never true  . Ran Out of Food in the Last Year: Never true  Transportation Needs: No Transportation Needs  . Lack of Transportation (Medical): No  . Lack of Transportation (Non-Medical): No  Physical Activity: Inactive  . Days of Exercise per Week: 0 days  . Minutes of Exercise per Session: 0 min  Stress: No Stress Concern Present  . Feeling of Stress : Not at all  Social Connections: Moderately Integrated  . Frequency of Communication with Friends and Family: Three times a week  . Frequency of Social Gatherings with Friends and Family: More than three times a week  . Attends Religious Services: More than 4 times per year  . Active Member of Clubs or Organizations: No  . Attends Archivist Meetings: Never  . Marital Status: Married  Human resources officer Violence: Not At Risk  . Fear  of Current or Ex-Partner: No  . Emotionally Abused: No  . Physically Abused: No  . Sexually Abused: No    Outpatient Medications Prior to Visit  Medication Sig Dispense Refill  . atorvastatin (LIPITOR) 80 MG tablet TAKE 1 TABLET BY MOUTH EVERY DAY AT 6PM 90 tablet 0  . clopidogrel (PLAVIX) 75 MG tablet TAKE 1 TABLET BY MOUTH EVERY DAY 90 tablet 0  . lisinopril (ZESTRIL) 2.5 MG tablet TAKE 1 TABLET BY MOUTH EVERY DAY 90 tablet 0  . sildenafil (VIAGRA) 100 MG tablet Take 0.5-1 tablets (50-100 mg total) by mouth daily as needed for erectile dysfunction. 6 tablet 11   No facility-administered medications prior to visit.    No Known Allergies  ROS Review of Systems  Constitutional: Negative for fatigue and unexpected weight change.  Eyes:  Negative for visual disturbance.  Respiratory: Negative for cough, chest tightness and shortness of breath.   Cardiovascular: Negative for chest pain, palpitations and leg swelling.  Endocrine: Negative for polydipsia and polyuria.  Neurological: Negative for dizziness, syncope, weakness, light-headedness and headaches.      Objective:    Physical Exam Constitutional:      Appearance: He is well-developed.  HENT:     Right Ear: External ear normal.     Left Ear: External ear normal.  Eyes:     Pupils: Pupils are equal, round, and reactive to light.  Neck:     Thyroid: No thyromegaly.  Cardiovascular:     Rate and Rhythm: Normal rate and regular rhythm.  Pulmonary:     Effort: Pulmonary effort is normal. No respiratory distress.     Breath sounds: Normal breath sounds. No wheezing or rales.  Musculoskeletal:     Cervical back: Neck supple.     Right lower leg: No edema.     Left lower leg: No edema.  Neurological:     Mental Status: He is alert and oriented to person, place, and time.     BP 120/60   Pulse 60   Temp 97.8 F (36.6 C) (Oral)   Ht 5\' 6"  (1.676 m)   Wt 160 lb 2 oz (72.6 kg)   SpO2 97%   BMI 25.84 kg/m  Wt Readings from Last 3 Encounters:  11/26/20 160 lb 2 oz (72.6 kg)  05/30/20 153 lb (69.4 kg)  02/20/20 161 lb (73 kg)     There are no preventive care reminders to display for this patient.  There are no preventive care reminders to display for this patient.  Lab Results  Component Value Date   TSH 0.318 (L) 05/24/2018   Lab Results  Component Value Date   WBC 11.0 (H) 05/24/2018   HGB 13.2 05/24/2018   HCT 40.4 05/24/2018   MCV 91.2 05/24/2018   PLT 284 05/24/2018   Lab Results  Component Value Date   NA 137 05/30/2020   K 4.8 05/30/2020   CO2 28 05/30/2020   GLUCOSE 114 (H) 05/30/2020   BUN 15 05/30/2020   CREATININE 0.89 05/30/2020   BILITOT 0.6 05/30/2020   ALKPHOS 48 05/14/2019   AST 15 05/30/2020   ALT 18 05/30/2020    PROT 6.4 05/30/2020   ALBUMIN 4.3 05/14/2019   CALCIUM 10.0 05/30/2020   ANIONGAP 9 05/24/2018   GFR 89.09 05/14/2019   Lab Results  Component Value Date   CHOL 122 05/30/2020   Lab Results  Component Value Date   HDL 39 (L) 05/30/2020   Lab Results  Component Value Date  [place] 70 05/30/2020   Lab Results  Component Value Date   TRIG 57 05/30/2020   Lab Results  Component Value Date   CHOLHDL 3.1 05/30/2020   Lab Results  Component Value Date   HGBA1C 6.6 (A) 11/26/2020      Assessment & Plan:   #1 type 2 diabetes fairly well controlled with A1c 6.6%.  We discussed lifestyle versus medication management and if prefers to continue the former.  We will plan routine follow-up in 6 months and recheck A1c then  #2 hypertension stable and at goal -Continue low-dose lisinopril 2.5 mg daily  #3 hyperlipidemia treated with atorvastatin.  Goal LDL less than 70.  Recheck lipids in 21-month follow-up  No orders of the defined types were placed in this encounter.   Follow-up: No follow-ups on file.    Carolann Littler, MD

## 2020-11-26 NOTE — Patient Instructions (Signed)
Diabetes Mellitus and Nutrition, Adult When you have diabetes, or diabetes mellitus, it is very important to have healthy eating habits because your blood sugar (glucose) levels are greatly affected by what you eat and drink. Eating healthy foods in the right amounts, at about the same times every day, can help you:  Control your blood glucose.  Lower your risk of heart disease.  Improve your blood pressure.  Reach or maintain a healthy weight. What can affect my meal plan? Every person with diabetes is different, and each person has different needs for a meal plan. Your health care provider may recommend that you work with a dietitian to make a meal plan that is best for you. Your meal plan may vary depending on factors such as:  The calories you need.  The medicines you take.  Your weight.  Your blood glucose, blood pressure, and cholesterol levels.  Your activity level.  Other health conditions you have, such as heart or kidney disease. How do carbohydrates affect me? Carbohydrates, also called carbs, affect your blood glucose level more than any other type of food. Eating carbs naturally raises the amount of glucose in your blood. Carb counting is a method for keeping track of how many carbs you eat. Counting carbs is important to keep your blood glucose at a healthy level, especially if you use insulin or take certain oral diabetes medicines. It is important to know how many carbs you can safely have in each meal. This is different for every person. Your dietitian can help you calculate how many carbs you should have at each meal and for each snack. How does alcohol affect me? Alcohol can cause a sudden decrease in blood glucose (hypoglycemia), especially if you use insulin or take certain oral diabetes medicines. Hypoglycemia can be a life-threatening condition. Symptoms of hypoglycemia, such as sleepiness, dizziness, and confusion, are similar to symptoms of having too much  alcohol.  Do not drink alcohol if: ? Your health care provider tells you not to drink. ? You are pregnant, may be pregnant, or are planning to become pregnant.  If you drink alcohol: ? Do not drink on an empty stomach. ? Limit how much you use to:  0-1 drink a day for women.  0-2 drinks a day for men. ? Be aware of how much alcohol is in your drink. In the [place] one drink equals one 12 oz bottle of beer (355 mL), one 5 oz glass of wine (148 mL), or one 1 oz glass of hard liquor (44 mL). ? Keep yourself hydrated with water, diet soda, or unsweetened iced tea.  Keep in mind that regular soda, juice, and other mixers may contain a lot of sugar and must be counted as carbs. What are tips for following this plan? Reading food labels  Start by checking the serving size on the "Nutrition Facts" label of packaged foods and drinks. The amount of calories, carbs, fats, and other nutrients listed on the label is based on one serving of the item. Many items contain more than one serving per package.  Check the total grams (g) of carbs in one serving. You can calculate the number of servings of carbs in one serving by dividing the total carbs by 15. For example, if a food has 30 g of total carbs per serving, it would be equal to 2 servings of carbs.  Check the number of grams (g) of saturated fats and trans fats in one serving. Choose foods that have   a low amount or none of these fats.  Check the number of milligrams (mg) of salt (sodium) in one serving. Most people should limit total sodium intake to less than 2,300 mg per day.  Always check the nutrition information of foods labeled as "low-fat" or "nonfat." These foods may be higher in added sugar or refined carbs and should be avoided.  Talk to your dietitian to identify your daily goals for nutrients listed on the label. Shopping  Avoid buying canned, pre-made, or processed foods. These foods tend to be high in fat, sodium, and added  sugar.  Shop around the outside edge of the grocery store. This is where you will most often find fresh fruits and vegetables, bulk grains, fresh meats, and fresh dairy. Cooking  Use low-heat cooking methods, such as baking, instead of high-heat cooking methods like deep frying.  Cook using healthy oils, such as olive, canola, or sunflower oil.  Avoid cooking with butter, cream, or high-fat meats. Meal planning  Eat meals and snacks regularly, preferably at the same times every day. Avoid going long periods of time without eating.  Eat foods that are high in fiber, such as fresh fruits, vegetables, beans, and whole grains. Talk with your dietitian about how many servings of carbs you can eat at each meal.  Eat 4-6 oz (112-168 g) of lean protein each day, such as lean meat, chicken, fish, eggs, or tofu. One ounce (oz) of lean protein is equal to: ? 1 oz (28 g) of meat, chicken, or fish. ? 1 egg. ?  cup (62 g) of tofu.  Eat some foods each day that contain healthy fats, such as avocado, nuts, seeds, and fish.   What foods should I eat? Fruits Berries. Apples. Oranges. Peaches. Apricots. Plums. Grapes. Mango. Papaya. Pomegranate. Kiwi. Cherries. Vegetables Lettuce. Spinach. Leafy greens, including kale, chard, collard greens, and mustard greens. Beets. Cauliflower. Cabbage. Broccoli. Carrots. Green beans. Tomatoes. Peppers. Onions. Cucumbers. Brussels sprouts. Grains Whole grains, such as whole-wheat or whole-grain bread, crackers, tortillas, cereal, and pasta. Unsweetened oatmeal. Quinoa. Brown or wild rice. Meats and other proteins Seafood. Poultry without skin. Lean cuts of poultry and beef. Tofu. Nuts. Seeds. Dairy Low-fat or fat-free dairy products such as milk, yogurt, and cheese. The items listed above may not be a complete list of foods and beverages you can eat. Contact a dietitian for more information. What foods should I avoid? Fruits Fruits canned with  syrup. Vegetables Canned vegetables. Frozen vegetables with butter or cream sauce. Grains Refined white flour and flour products such as bread, pasta, snack foods, and cereals. Avoid all processed foods. Meats and other proteins Fatty cuts of meat. Poultry with skin. Breaded or fried meats. Processed meat. Avoid saturated fats. Dairy Full-fat yogurt, cheese, or milk. Beverages Sweetened drinks, such as soda or iced tea. The items listed above may not be a complete list of foods and beverages you should avoid. Contact a dietitian for more information. Questions to ask a health care provider  Do I need to meet with a diabetes educator?  Do I need to meet with a dietitian?  What number can I call if I have questions?  When are the best times to check my blood glucose? Where to find more information:  American Diabetes Association: diabetes.org  Academy of Nutrition and Dietetics: www.eatright.org  National Institute of Diabetes and Digestive and Kidney Diseases: www.niddk.nih.gov  Association of Diabetes Care and Education Specialists: www.diabeteseducator.org Summary  It is important to have healthy eating   habits because your blood sugar (glucose) levels are greatly affected by what you eat and drink.  A healthy meal plan will help you control your blood glucose and maintain a healthy lifestyle.  Your health care provider may recommend that you work with a dietitian to make a meal plan that is best for you.  Keep in mind that carbohydrates (carbs) and alcohol have immediate effects on your blood glucose levels. It is important to count carbs and to use alcohol carefully. This information is not intended to replace advice given to you by your health care provider. Make sure you discuss any questions you have with your health care provider. Document Revised: 08/07/2019 Document Reviewed: 08/07/2019 Elsevier Patient Education  2021 Elsevier Inc.  

## 2020-11-26 NOTE — Telephone Encounter (Signed)
Rxs have been sent in. 

## 2020-12-15 ENCOUNTER — Ambulatory Visit: Payer: Medicare Other | Admitting: Neurology

## 2021-01-03 ENCOUNTER — Encounter (HOSPITAL_BASED_OUTPATIENT_CLINIC_OR_DEPARTMENT_OTHER): Payer: Self-pay | Admitting: Emergency Medicine

## 2021-01-03 ENCOUNTER — Emergency Department (HOSPITAL_BASED_OUTPATIENT_CLINIC_OR_DEPARTMENT_OTHER): Payer: Medicare Other

## 2021-01-03 ENCOUNTER — Emergency Department (HOSPITAL_BASED_OUTPATIENT_CLINIC_OR_DEPARTMENT_OTHER)
Admission: EM | Admit: 2021-01-03 | Discharge: 2021-01-03 | Disposition: A | Discharge status: Home / Self Care | Payer: Medicare Other | Attending: Emergency Medicine | Admitting: Emergency Medicine

## 2021-01-03 ENCOUNTER — Other Ambulatory Visit: Payer: Self-pay

## 2021-01-03 DIAGNOSIS — M25551 Pain in right hip: Secondary | ICD-10-CM

## 2021-01-03 DIAGNOSIS — Z79899 Other long term (current) drug therapy: Secondary | ICD-10-CM | POA: Insufficient documentation

## 2021-01-03 DIAGNOSIS — I10 Essential (primary) hypertension: Secondary | ICD-10-CM | POA: Diagnosis not present

## 2021-01-03 DIAGNOSIS — X509XXA Other and unspecified overexertion or strenuous movements or postures, initial encounter: Secondary | ICD-10-CM | POA: Insufficient documentation

## 2021-01-03 DIAGNOSIS — Z7902 Long term (current) use of antithrombotics/antiplatelets: Secondary | ICD-10-CM | POA: Diagnosis not present

## 2021-01-03 DIAGNOSIS — Z87891 Personal history of nicotine dependence: Secondary | ICD-10-CM | POA: Diagnosis not present

## 2021-01-03 DIAGNOSIS — E119 Type 2 diabetes mellitus without complications: Secondary | ICD-10-CM | POA: Insufficient documentation

## 2021-01-03 DIAGNOSIS — Y9389 Activity, other specified: Secondary | ICD-10-CM | POA: Insufficient documentation

## 2021-01-03 DIAGNOSIS — M1611 Unilateral primary osteoarthritis, right hip: Secondary | ICD-10-CM | POA: Diagnosis not present

## 2021-01-03 MED ORDER — PREDNISONE 10 MG PO TABS
20.0000 mg | ORAL_TABLET | Freq: Every day | ORAL | 0 refills | Status: AC
Start: 2021-01-03 — End: 2021-01-08

## 2021-01-03 NOTE — ED Notes (Signed)
Radiology technician in room with patient now.

## 2021-01-03 NOTE — ED Provider Notes (Signed)
[place] EMERGENCY DEPARTMENT Provider Note   CSN: [id number] Arrival date & time: 01/03/21  1238     History Chief Complaint  Patient presents with  . Hip Pain    James Pugh is a 75 y.o. male.  HPI Patient is a 75 year old male with a medical history as noted below.  He presents the emergency department due to right hip pain.  Patient states he was working in the yard yesterday and was crouching in low positions and cutting tree limbs.  He states later in the day began experiencing moderate pain diffusely along the right hip.  No back pain.  No numbness or bowel or bladder incontinence.  Patient is still ambulatory.  He states that his daughter is a [employer] scientist and she recommended that he come to the emergency department for x-rays of the right hip.  No other complaints.    Past Medical History:  Diagnosis Date  . CERUMEN IMPACTION 08/22/2009  . HYPERLIPIDEMIA 06/06/2009  . PREDIABETES 07/13/2010  . Stroke [facility])     Patient Active Problem List   Diagnosis Date Noted  . Atypical chest pain 05/24/2018  . Chest pain 05/24/2018  . Goiter 05/24/2018  . Acute blood loss anemia   . Hypoalbuminemia due to protein-calorie malnutrition ([place])   . Reactive hypertension   . 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 Acute CVA (cerebrovascular accident) ([place])   . Dysarthria, post-stroke   . Hyperlipidemia   . Diabetes mellitus type 2 in nonobese ([facility])   . Benign essential HTN   . Status post cataract extraction   . OSA (obstructive sleep apnea)   . CVA (cerebral vascular accident) ([place]) 10/22/2017  . CERUMEN IMPACTION 08/22/2009  . Dyslipidemia 06/06/2009    Past Surgical History:  Procedure Laterality Date  . TONSILLECTOMY         Family History  Problem Relation Age of Onset  . Stroke Father   . Heart disease Father   . Diabetes Father        type ll  . Alcohol abuse Other   . Stroke Brother     Social History   Tobacco Use  .  Smoking status: Former Smoker    Packs/day: 1.00    Years: 20.00    Pack years: 20.00    Types: Cigarettes    Quit date: 08/11/1983    Years since quitting: 37.4  . Smokeless tobacco: Never Used  Vaping Use  . Vaping Use: Never used  Substance Use Topics  . Alcohol use: No  . Drug use: No    Home Medications Prior to Admission medications   Medication Sig Start Date End Date Taking? Authorizing Provider  predniSONE (DELTASONE) 10 MG tablet Take 2 tablets (20 mg total) by mouth daily with breakfast for 5 days. 01/03/21 01/08/21 Yes Rayna Sexton, PA-C  atorvastatin (LIPITOR) 80 MG tablet Take 1 tablet (80 mg total) by mouth daily. 11/26/20   Burchette, Alinda Sierras, MD  clopidogrel (PLAVIX) 75 MG tablet Take 1 tablet (75 mg total) by mouth daily. 11/26/20   Burchette, Alinda Sierras, MD  lisinopril (ZESTRIL) 2.5 MG tablet Take 1 tablet (2.5 mg total) by mouth daily. 11/26/20   Burchette, Alinda Sierras, MD  sildenafil (VIAGRA) 100 MG tablet Take 0.5-1 tablets (50-100 mg total) by mouth daily as needed for erectile dysfunction. 05/30/20   Burchette, Alinda Sierras, MD    Allergies    Patient has no known allergies.  Review of Systems   Review of  Systems  Musculoskeletal: Positive for arthralgias and myalgias. Negative for joint swelling.  Neurological: Negative for weakness and numbness.    Physical Exam Updated Vital Signs BP 135/61 (BP Location: Right Arm)   Pulse (!) 52   Temp 98.2 F (36.8 C) (Oral)   Resp 18   Ht 5\' 6"  (1.676 m)   Wt 66.7 kg   SpO2 100%   BMI 23.73 kg/m   Physical Exam Vitals and nursing note reviewed.  Constitutional:      General: He is not in acute distress.    Appearance: He is well-developed.  HENT:     Head: Normocephalic and atraumatic.     Right Ear: External ear normal.     Left Ear: External ear normal.  Eyes:     General: No scleral icterus.       Right eye: No discharge.        Left eye: No discharge.     Conjunctiva/sclera: Conjunctivae normal.  Neck:      Trachea: No tracheal deviation.  Cardiovascular:     Rate and Rhythm: Normal rate.  Pulmonary:     Effort: Pulmonary effort is normal. No respiratory distress.     Breath sounds: No stridor.  Abdominal:     General: There is no distension.  Musculoskeletal:        General: Tenderness present. No swelling or deformity.     Cervical back: Neck supple.     Comments: Mild TTP noted diffusely along the right lateral hip.  Additional mild TTP noted along the right gluteal musculature.  Full range of motion of the right hip both actively and passively.  Worsening pain with internal rotation of the right hip.  Patient is ambulatory with a steady gait.  2+ DP pulses.  Distal sensation intact.  Skin:    General: Skin is warm and dry.     Findings: No rash.  Neurological:     General: No focal deficit present.     Mental Status: He is alert and oriented to person, place, and time.     Cranial Nerves: Cranial nerve deficit: no gross deficits.    ED Results / Procedures / Treatments   Labs (all labs ordered are listed, but only abnormal results are displayed) Labs Reviewed - No data to display  EKG None  Radiology DG Hip Unilat W or Wo Pelvis 1 View Right  Result Date: 01/03/2021 CLINICAL DATA:  Right hip pain EXAM: DG HIP (WITH OR WITHOUT PELVIS) 1V RIGHT COMPARISON:  None. FINDINGS: Mild right hip osteoarthritis with subchondral sclerosis and small marginal spur formation. No acute fracture or dislocation. IMPRESSION: Mild right hip osteoarthritis. Electronically Signed   By: Kerby Moors M.D.   On: 01/03/2021 14:10    Procedures Procedures   Medications Ordered in ED Medications - No data to display  ED Course  I have reviewed the triage vital signs and the nursing notes.  Pertinent labs & imaging results that were available during my care of the patient were reviewed by me and considered in my medical decision making (see chart for details).    MDM Rules/Calculators/A&P                           Patient is a 75 year old male who presents the emergency department with right hip pain after working in the yard yesterday.  Reassuring physical exam.  Neurovascular intact in the right leg.  Ambulatory with a steady gait.  X-ray of the right hip is reassuring.  Feel that this is likely secondary to muscle strain versus bursitis.  He has been taking ibuprofen and applying ice with mild short-term relief.  Recommended he continue with anti-inflammatories.  Also recommended Tylenol.  We discussed dosing.  RICE method.  Will prescribe a short course of prednisone as well.  Feel that he is stable for discharge at this time and he is agreeable.  He is going to follow-up with his orthopedist in 1 week if his symptoms have not improved.  His questions were answered and he was amicable at the time of discharge.  Final Clinical Impression(s) / ED Diagnoses Final diagnoses:  Right hip pain   Rx / DC Orders ED Discharge Orders         Ordered    predniSONE (DELTASONE) 10 MG tablet  Daily with breakfast        01/03/21 1430           Rayna Sexton, PA-C 01/03/21 1431    Blanchie Dessert, MD 01/03/21 1445

## 2021-01-03 NOTE — Discharge Instructions (Addendum)
You can continue taking ibuprofen and Tylenol for management of your pain.  I would recommend taking 2 regular Tylenol with 2 regular ibuprofen, 3-4 times per day.  I will also prescribe you a very short course of steroids to help with the inflammation in the right hip.  You can take 20 mg per morning for the next 5 days.  This medication can be stimulating so try to take it earlier in the day so that you do not have difficulty sleeping.  I would also recommend applying anti-inflammatory gels to the right hip.  You can try Voltaren gel.  Aleve also has a topical gel.  Continue to apply heat/ice as needed.  Please follow-up with your regular doctor regarding your symptoms.  If they worsen, you can always return to the emergency department.  It was a pleasure to meet you.

## 2021-01-03 NOTE — ED Triage Notes (Signed)
Pt c/o right hip pain onset yesterday. Pt reports that he was doing a lot of stuff in the yard yesterday.

## 2021-01-03 NOTE — ED Notes (Signed)
Pt states he was working up under a small tree, twisting in awkward positions, but denies any pain or known injury at the time.  Pt states about 1-2 hours later, right hip pain started.  Pt took 600mg  ibuprofen this morning around 9a today with minimal relief.  Pt states pain starts in right hip joint and radiates up and down from there.  Pain is worse today than yesterday.

## 2021-01-14 DIAGNOSIS — M25551 Pain in right hip: Secondary | ICD-10-CM | POA: Diagnosis not present

## 2021-01-25 ENCOUNTER — Encounter: Payer: Self-pay | Admitting: Neurology

## 2021-01-28 ENCOUNTER — Encounter: Payer: Self-pay | Admitting: Neurology

## 2021-01-28 ENCOUNTER — Ambulatory Visit (INDEPENDENT_AMBULATORY_CARE_PROVIDER_SITE_OTHER): Payer: Medicare Other | Admitting: Neurology

## 2021-01-28 VITALS — BP 132/77 | HR 59 | Ht 66.0 in | Wt 159.0 lb

## 2021-01-28 DIAGNOSIS — I6350 Cerebral infarction due to unspecified occlusion or stenosis of unspecified cerebral artery: Secondary | ICD-10-CM

## 2021-01-28 DIAGNOSIS — R4789 Other speech disturbances: Secondary | ICD-10-CM

## 2021-01-28 DIAGNOSIS — Z818 Family history of other mental and behavioral disorders: Secondary | ICD-10-CM

## 2021-01-28 DIAGNOSIS — G4733 Obstructive sleep apnea (adult) (pediatric): Secondary | ICD-10-CM | POA: Diagnosis not present

## 2021-01-28 DIAGNOSIS — R413 Other amnesia: Secondary | ICD-10-CM | POA: Diagnosis not present

## 2021-01-28 NOTE — Patient Instructions (Signed)
It was nice to see you both again today.  You are compliant with your BiPAP, keep up the good work!   Please continue to pursue healthy lifestyle, good nutrition, good hydration with water, meaning, drink at 6 to 8 cups of water per day, 8 ounce size each, try to get 7 to 8 hours of sleep on an average night. For sleep difficulty you can try Melatonin at night for sleep: take 1 mg to 3 mg, one to 2 hours before your bedtime. You can go up to 5 or 6 mg if needed. It is over the counter and comes in pill form, chewable form and spray, if you prefer.    Please continue using your BiPAP regularly. While your insurance requires that you use BiPAP at least 4 hours each night on 70% of the nights, I recommend, that you not skip any nights and use it throughout the night if you can. Getting used to BiPAP and staying with the treatment long term does take time and patience and discipline. Untreated obstructive sleep apnea when it is moderate to severe can have an adverse impact on cardiovascular health and raise her risk for heart disease, arrhythmias, hypertension, congestive heart failure, stroke and diabetes. Untreated obstructive sleep apnea causes sleep disruption, nonrestorative sleep, and sleep deprivation. This can have an impact on your day to day functioning and cause daytime sleepiness and impairment of cognitive function, memory loss, mood disturbance, and problems focussing. Using BiPAP regularly can improve these symptoms.  For your concern and complains of short-term memory loss, we will go ahead and do a brain MRI and compare findings with previously.  We will call you with the results.  For now, you can follow-up routinely in 1 year, sooner if needed.

## 2021-01-28 NOTE — Progress Notes (Signed)
Subjective:    Patient ID: James Pugh is a 75 y.o. male.  HPI     Interim history:   James Pugh is a 75 year old right-handed gentleman with an underlying medical history of diabetes, R pontine stroke, hyperlipidemia, and overweight state, who presents for follow-up consultation of his obstructive sleep apnea, on BiPAP therapy. The patient is accompanied by his wife today and presents for his yearly check up. I last saw him on 11/06/18, Admission when he was compliant with treatment and BiPAP therapy.  He was advised to follow-up in 1 year.  He saw James Rider, NP in the interim on 12/02/2019, at which time he continued to do well on BiPAP of 17/13.  Today, 01/28/2021: I reviewed his BiPAP compliance data from 12/27/2020 through 01/25/2021, which is a total of 30 days, during which time he used his machine 28 days with percent use days greater than 4 hours at 87%, indicating very good compliance with an average usage for days on treatment of 6 hours and 30 minutes, residual AHI at goal at 0.4/h, leak on the low side, with the 95th percentile at 7.3 L/min on a pressure of 17/13 centimeters.  He reports not always sleeping through the night, he tosses and turns, sometimes the mass dislodges and starts leaking.  He is up-to-date with his supplies, uses a F 30 hybrid style fullface mask with good tolerance, the traditional fullface mask was not tolerable to him.  His wife is concerned about his short-term memory, sometimes he forgets a conversation, he has noticed word finding and name retrieval issues.  Wife is particularly concerned as his mother had dementia.  He is active, he hydrates well, he eats healthy.  He has a checkup with his primary care at least twice a year.  He recently reinjured his right hip, he previously had seen Dr. Maureen Pugh, recently saw the PA and was given a prescription for Robaxin as needed.   The patient's allergies, current medications, family history, past medical history,  past social history, past surgical history and problem list were reviewed and updated as appropriate.    Previously:    I saw him on 05/03/2018, at which time he was able to tolerate the BiPAP pressure. He was sleeping better at night per wife's report. His residual sleep disordered breathing improved after we increase the pressure to 16/12. I suggested we increase it further to 17/13 due to residual AHI around 10 at the time.   I reviewed his compliance data in the interim and AHI had improved. He was advised to continue with the pressure of 17/13 cm.   He was seen in the interim in stroke clinic for follow-up on 07/06/2018.   I reviewed his BiPAP compliance data from 10/03/2018 through 11/01/2018 which is a total of 30 days, during which time he used his BiPAP every night except for 1, with percent used days greater than 4 hours at 97%, indicating excellent compliance with an average usage of 7 hours and 4 minutes, residual AHI at goal at 1.9 per hour, leak on the low side with the 95th percentile at 2.1 L/m on a pressure of 17/13.    I saw him on 03/22/2018, at which time he was compliant with BiPAP but his residual AHI was around 15. I suggested we increase the pressure. He did report that sometimes it felt like he was not getting enough air in. Overall, he felt an improvement in his sleep quality and his wife noted that his  daytime energy was better.    I reviewed his BiPAP compliance data from 04/02/2018 through 05/01/2018 which is a total of 30 days, during which time he used his BiPAP every night with percent used days greater than 4 hours at 100%, indicating superb compliance with an average usage of 6 hours and 40 minutes, residual AHI slightly suboptimal still at 10 per hour but improved from before, leak low with the 95th percentile at 3.4 L/m on a pressure of 16/12 cm.    I first met him on 11/14/2017 at the request of Dr. Erlinda Pugh, at which time he was reported to have witnessed breathing  pauses while asleep, snoring and excessive daytime somnolence. He was advised to proceed with sleep study testing. He had a split-night sleep study on 12/20/2017. I went over his test results with him in detail today. He had significant sleep disruption at baseline, he had severe events, particularly in rem sleep. He did fairly well on CPAP but did have difficulty maintaining sleep later on. He was briefly tried on BiPAP of 14/10 but barely achieved any sleep on BiPAP during the study. I suggested a home treatment pressure of 12 cm. He called in the interim reporting residual increase in events. His AHI was around 15 on a pressure of 12 cm and in mid-May I changed his pressure setting to BiPAP of 14/10. He has had difficulty with mask tolerance  and seal and try different masks in the interim. He does report difficulty getting in touch with his DME company recently.     I reviewed his BiPAP compliance data from the last 30 days which is from 02/19/2018 through 03/20/2018, during which time he used his machine every night except for 1, percent used days greater than 4 hours was 97% which is excellent, average usage also excellent at 6 hours and 59 minutes, residual AHI suboptimal at 14.8 per hour primarily because of obstructive events. Previously on CPAP he did have more central events. Seal is very good, 95th percentile of leak at 2.6 L/m on a pressure of 14/10. Ramp time per wife's report is 5 minutes.    11/14/2017: (He) reports snoring and excessive daytime somnolence. He has been witnessed to have witnessed breathing pauses while asleep. He has woken up with a sense of shortness of breath. He had extensive stroke workup during his hospitalization from 10/22/2017 through 10/24/2017. He was in an inpatient rehabilitation from 10/24/2017 through 10/31/2017. I reviewed the hospital records. His Epworth sleepiness score is 9 out of 24 today, fatigue score is 12 out of 63.  He sleeps on his sides and back. BT  is around 11 PM and WT around 7 AM. He is retired from [employer], Engineer, maintenance (IT). Quit smoking about 40 years, no EtOH, he does not drink caffeine on a regular basis, no FHx of OSA. He had a TE as a child.  He denies AM HAs, has nocturia about 1-2/night on average. He takes a nap in the afternoons sometimes.   His Past Medical History Is Significant For: Past Medical History:  Diagnosis Date  . CERUMEN IMPACTION 08/22/2009  . HYPERLIPIDEMIA 06/06/2009  . PREDIABETES 07/13/2010  . Stroke [facility])     His Past Surgical History Is Significant For: Past Surgical History:  Procedure Laterality Date  . TONSILLECTOMY      His Family History Is Significant For: Family History  Problem Relation Age of Onset  . Stroke Father   . Heart disease Father   . Diabetes  Father        type ll  . Alcohol abuse Other   . Stroke Brother     His Social History Is Significant For: Social History   Socioeconomic History  . Marital status: Married    Spouse name: Not on file  . Number of children: Not on file  . Years of education: Not on file  . Highest education level: Not on file  Occupational History  . Occupation: Retired  Tobacco Use  . Smoking status: Former Smoker    Packs/day: 1.00    Years: 20.00    Pack years: 20.00    Types: Cigarettes    Quit date: 08/11/1983    Years since quitting: 37.4  . Smokeless tobacco: Never Used  Vaping Use  . Vaping Use: Never used  Substance and Sexual Activity  . Alcohol use: No  . Drug use: No  . Sexual activity: Not on file  Other Topics Concern  . Not on file  Social History Narrative  . Not on file   Social Determinants of Health   Financial Resource Strain: Low Risk   . Difficulty of Paying Living Expenses: Not hard at all  Food Insecurity: No Food Insecurity  . Worried About Charity fundraiser in the Last Year: Never true  . Ran Out of Food in the Last Year: Never true  Transportation Needs: No Transportation Needs  . Lack of  Transportation (Medical): No  . Lack of Transportation (Non-Medical): No  Physical Activity: Inactive  . Days of Exercise per Week: 0 days  . Minutes of Exercise per Session: 0 min  Stress: No Stress Concern Present  . Feeling of Stress : Not at all  Social Connections: Moderately Integrated  . Frequency of Communication with Friends and Family: Three times a week  . Frequency of Social Gatherings with Friends and Family: More than three times a week  . Attends Religious Services: More than 4 times per year  . Active Member of Clubs or Organizations: No  . Attends Archivist Meetings: Never  . Marital Status: Married    His Allergies Are:  No Known Allergies:   His Current Medications Are:  Outpatient Encounter Medications as of 01/28/2021  Medication Sig  . atorvastatin (LIPITOR) 80 MG tablet Take 1 tablet (80 mg total) by mouth daily.  . clopidogrel (PLAVIX) 75 MG tablet Take 1 tablet (75 mg total) by mouth daily.  Marland Kitchen lisinopril (ZESTRIL) 2.5 MG tablet Take 1 tablet (2.5 mg total) by mouth daily.  . methocarbamol (ROBAXIN) 500 MG tablet Take 1 tablet by mouth 4 (four) times daily.  . [DISCONTINUED] sildenafil (VIAGRA) 100 MG tablet Take 0.5-1 tablets (50-100 mg total) by mouth daily as needed for erectile dysfunction.   No facility-administered encounter medications on file as of 01/28/2021.  :  Review of Systems:  Out of a complete 14 point review of systems, all are reviewed and negative with the exception of these symptoms as listed below:  Review of Systems  Neurological:       Pt presents today for follow up with BiPAP machine. Overall states everything is well. No issues or concerns. DME [employer] [facility]    Objective:  Neurological Exam  Physical Exam Physical Examination:   Vitals:   01/28/21 0730  BP: 132/77  Pulse: (!) 59    General Examination: The patient is a very pleasant 75 y.o. male in no acute distress. He appears well-developed and  well-nourished and well groomed.  HEENT:Normocephalic, atraumatic, pupils are equal, round and reactive. Extraocular tracking is good, he is status post cataract surgeries and has corrective eyeglasses in place. Face is symmetric, speech clear.  No significant mouth dryness.  Tongue protrudes centrally and palate elevates symmetrically.  Chest:Clear to auscultation without wheezing, rhonchi or crackles noted.  Heart:S1+S2+0, regular and normal without murmurs, rubs or gallops noted.   Abdomen:Soft, non-tender and non-distended.  Extremities:There isno edema in the distal lower extremities bilaterally.   Skin: Warm and dry without trophic changes noted.Sun exposure type changes.  Musculoskeletal: exam reveals no obvious joint deformities, tenderness, reports some discomfort in the right hip.  Neurologically:  Mental status: The patient is awake, alert and oriented in all 4 spheres.Hisimmediate and remote memory, attention, language skills and fund of knowledge are appropriate, wife provides some details of the history.  There is no evidence of aphasia, agnosia, apraxia or anomia. Speech is clear with normal prosody and enunciation. Thought process is linear. Mood is normaland affect is normal.  Cranial nerves II - XII are as described above under HEENT exam.  Motor exam: Normal bulk, and tone is noted. There is no tremor. Fine motor skills and coordination:grosslyintact.  Cerebellar testing: No dysmetria or intention tremor. There is no truncal or gait ataxia.  Sensory exam: intact to light touch.  Gait, station and balance:Hestands easily. No veering to one side is noted. No leaning to one side is noted. Posture is age-appropriate and stance is narrow based. Gait showsnormalstride length and normalpace. No problems turning are noted, no obvious limp, no walking aid.  Assessmentand Plan:  [place] a very pleasant 34-year  oldmalewith an underlying medical history of diabetes, right pontine stroke, hyperlipidemia, andborderlineoverweight state, whopresents for follow-up consultation of his obstructive sleep apnea which was determined to be severe during his split-night study in April 2019. He started CPAP therapy for about a month andswitched to BiPAP therapy secondary to central apneas noted. He was able to tolerate BiPAP better than CPAP thankfully. We increased the pressure to 16/12, and then further to 17/13 cm in August 2019. His AHI improved, and he continues to be fully compliant with treatment.  He is commended for his treatment adherence.  He has benefited from treatment but he has noticed difficulty maintaining sleep.  He has also noted word finding difficulty and his wife is worried about his short-term memory.  We will continue to monitor.  He is advised to continue to pursue a healthy lifestyle including good nutrition, good hydration with water and physical activity.  We will go ahead with a brain MRI without contrast and compare findings with his previous MRI.  He is up-to-date with his BiPAP supplies and motivated to continue with treatment.  He is advised to follow-up routinely in 1 year, sooner if needed and we will notify  them in the interim with the MRI results by phone call.  I answered all their questions today and the patient and his wife were in agreement. I spent 30 minutes in total face-to-face time and in reviewing records during pre-charting, more than 50% of which was spent in counseling and coordination of care, reviewing test results, reviewing medications and treatment regimen and/or in discussing or reviewing the diagnosis of OSA, memory loss, the prognosis and treatment options. Pertinent laboratory and imaging test results that were available during this visit with the patient were reviewed by me and considered in my medical decision making (see chart for details).

## 2021-02-04 ENCOUNTER — Other Ambulatory Visit: Payer: Medicare Other

## 2021-02-05 DIAGNOSIS — M25551 Pain in right hip: Secondary | ICD-10-CM | POA: Diagnosis not present

## 2021-02-05 DIAGNOSIS — M5416 Radiculopathy, lumbar region: Secondary | ICD-10-CM | POA: Diagnosis not present

## 2021-02-05 DIAGNOSIS — M9903 Segmental and somatic dysfunction of lumbar region: Secondary | ICD-10-CM | POA: Diagnosis not present

## 2021-02-05 DIAGNOSIS — M5431 Sciatica, right side: Secondary | ICD-10-CM | POA: Diagnosis not present

## 2021-02-10 DIAGNOSIS — M9903 Segmental and somatic dysfunction of lumbar region: Secondary | ICD-10-CM | POA: Diagnosis not present

## 2021-02-10 DIAGNOSIS — M5431 Sciatica, right side: Secondary | ICD-10-CM | POA: Diagnosis not present

## 2021-02-11 ENCOUNTER — Other Ambulatory Visit: Payer: Self-pay

## 2021-02-11 ENCOUNTER — Ambulatory Visit
Admission: RE | Admit: 2021-02-11 | Discharge: 2021-02-11 | Disposition: A | Discharge status: Home / Self Care | Payer: Medicare Other | Source: Ambulatory Visit | Attending: Neurology | Admitting: Neurology

## 2021-02-11 DIAGNOSIS — R4789 Other speech disturbances: Secondary | ICD-10-CM

## 2021-02-11 DIAGNOSIS — G4733 Obstructive sleep apnea (adult) (pediatric): Secondary | ICD-10-CM

## 2021-02-11 DIAGNOSIS — R413 Other amnesia: Secondary | ICD-10-CM

## 2021-02-11 DIAGNOSIS — Z818 Family history of other mental and behavioral disorders: Secondary | ICD-10-CM

## 2021-02-11 DIAGNOSIS — I6350 Cerebral infarction due to unspecified occlusion or stenosis of unspecified cerebral artery: Secondary | ICD-10-CM

## 2021-02-12 NOTE — Progress Notes (Signed)
Please call patient or his wife regarding his recent brain MRI: There were no acute findings, stable findings from February 2019 were seen which is reassuring.  Overall, mild changes with regards to hardening of the arteries and mild changes with regards to volume loss which we call atrophy.  We can continue to monitor his symptoms and follow up as planned.

## 2021-02-16 ENCOUNTER — Telehealth: Payer: Self-pay

## 2021-02-16 NOTE — Telephone Encounter (Signed)
I called the pt and reviewed results of MRI. Pt verbalized understanding and verbalized understanding. Pt will f/u with [place] as planned in 1 year.

## 2021-02-16 NOTE — Telephone Encounter (Signed)
-----   Message from Star Age, MD sent at 02/12/2021  6:03 PM EDT ----- Please call patient or his wife regarding his recent brain MRI: There were no acute findings, stable findings from February 2019 were seen which is reassuring.  Overall, mild changes with regards to hardening of the arteries and mild changes with regards to volume loss which we call atrophy.  We can continue to monitor his symptoms and follow up as planned.

## 2021-02-19 DIAGNOSIS — M5431 Sciatica, right side: Secondary | ICD-10-CM | POA: Diagnosis not present

## 2021-02-19 DIAGNOSIS — M9903 Segmental and somatic dysfunction of lumbar region: Secondary | ICD-10-CM | POA: Diagnosis not present

## 2021-02-23 DIAGNOSIS — M9903 Segmental and somatic dysfunction of lumbar region: Secondary | ICD-10-CM | POA: Diagnosis not present

## 2021-02-23 DIAGNOSIS — M5431 Sciatica, right side: Secondary | ICD-10-CM | POA: Diagnosis not present

## 2021-02-25 DIAGNOSIS — M5431 Sciatica, right side: Secondary | ICD-10-CM | POA: Diagnosis not present

## 2021-02-25 DIAGNOSIS — M9903 Segmental and somatic dysfunction of lumbar region: Secondary | ICD-10-CM | POA: Diagnosis not present

## 2021-03-02 DIAGNOSIS — M9903 Segmental and somatic dysfunction of lumbar region: Secondary | ICD-10-CM | POA: Diagnosis not present

## 2021-03-02 DIAGNOSIS — M5431 Sciatica, right side: Secondary | ICD-10-CM | POA: Diagnosis not present

## 2021-03-04 DIAGNOSIS — C44519 Basal cell carcinoma of skin of other part of trunk: Secondary | ICD-10-CM | POA: Diagnosis not present

## 2021-03-04 DIAGNOSIS — Z85828 Personal history of other malignant neoplasm of skin: Secondary | ICD-10-CM | POA: Diagnosis not present

## 2021-03-04 DIAGNOSIS — D1801 Hemangioma of skin and subcutaneous tissue: Secondary | ICD-10-CM | POA: Diagnosis not present

## 2021-03-04 DIAGNOSIS — C44212 Basal cell carcinoma of skin of right ear and external auricular canal: Secondary | ICD-10-CM | POA: Diagnosis not present

## 2021-03-04 DIAGNOSIS — D485 Neoplasm of uncertain behavior of skin: Secondary | ICD-10-CM | POA: Diagnosis not present

## 2021-03-04 DIAGNOSIS — L570 Actinic keratosis: Secondary | ICD-10-CM | POA: Diagnosis not present

## 2021-03-04 DIAGNOSIS — L820 Inflamed seborrheic keratosis: Secondary | ICD-10-CM | POA: Diagnosis not present

## 2021-03-25 DIAGNOSIS — Z85828 Personal history of other malignant neoplasm of skin: Secondary | ICD-10-CM | POA: Diagnosis not present

## 2021-03-25 DIAGNOSIS — C44212 Basal cell carcinoma of skin of right ear and external auricular canal: Secondary | ICD-10-CM | POA: Diagnosis not present

## 2021-03-30 DIAGNOSIS — M9903 Segmental and somatic dysfunction of lumbar region: Secondary | ICD-10-CM | POA: Diagnosis not present

## 2021-03-30 DIAGNOSIS — M5431 Sciatica, right side: Secondary | ICD-10-CM | POA: Diagnosis not present

## 2021-03-31 DIAGNOSIS — Z20822 Contact with and (suspected) exposure to covid-19: Secondary | ICD-10-CM | POA: Diagnosis not present

## 2021-04-02 DIAGNOSIS — M5431 Sciatica, right side: Secondary | ICD-10-CM | POA: Diagnosis not present

## 2021-04-02 DIAGNOSIS — M9903 Segmental and somatic dysfunction of lumbar region: Secondary | ICD-10-CM | POA: Diagnosis not present

## 2021-05-21 DIAGNOSIS — Z85828 Personal history of other malignant neoplasm of skin: Secondary | ICD-10-CM | POA: Diagnosis not present

## 2021-05-21 DIAGNOSIS — L570 Actinic keratosis: Secondary | ICD-10-CM | POA: Diagnosis not present

## 2021-06-17 ENCOUNTER — Other Ambulatory Visit: Payer: Self-pay

## 2021-06-17 ENCOUNTER — Other Ambulatory Visit (INDEPENDENT_AMBULATORY_CARE_PROVIDER_SITE_OTHER): Payer: Medicare Other

## 2021-06-17 DIAGNOSIS — Z125 Encounter for screening for malignant neoplasm of prostate: Secondary | ICD-10-CM

## 2021-06-17 DIAGNOSIS — R351 Nocturia: Secondary | ICD-10-CM

## 2021-06-17 DIAGNOSIS — E785 Hyperlipidemia, unspecified: Secondary | ICD-10-CM

## 2021-06-17 DIAGNOSIS — I10 Essential (primary) hypertension: Secondary | ICD-10-CM

## 2021-06-17 DIAGNOSIS — E119 Type 2 diabetes mellitus without complications: Secondary | ICD-10-CM | POA: Diagnosis not present

## 2021-06-17 LAB — LIPID PANEL
Cholesterol: 136 mg/dL (ref 0–200)
HDL: 37.9 mg/dL — ABNORMAL LOW (ref >39.00)
LDL Cholesterol: 83 mg/dL (ref 0–99)
NonHDL: 98.01
Total CHOL/HDL Ratio: 4
Triglycerides: 74 mg/dL (ref 0.0–149.0)
VLDL: 14.8 mg/dL (ref 0.0–40.0)

## 2021-06-17 LAB — BASIC METABOLIC PANEL
BUN: 13 mg/dL (ref 6–23)
CO2: 30 mEq/L (ref 19–32)
Calcium: 9.7 mg/dL (ref 8.4–10.5)
Chloride: 104 mEq/L (ref 96–112)
Creatinine, Ser: 0.88 mg/dL (ref 0.40–1.50)
GFR: 83.22 mL/min (ref >60.00)
Glucose, Bld: 110 mg/dL — ABNORMAL HIGH (ref 70–99)
Potassium: 4.2 mEq/L (ref 3.5–5.1)
Sodium: 139 mEq/L (ref 135–145)

## 2021-06-17 LAB — HEMOGLOBIN A1C: Hgb A1c MFr Bld: 6.8 % — ABNORMAL HIGH (ref 4.6–6.5)

## 2021-06-17 LAB — HEPATIC FUNCTION PANEL
ALT: 22 U/L (ref 0–53)
AST: 19 U/L (ref 0–37)
Albumin: 4 g/dL (ref 3.5–5.2)
Alkaline Phosphatase: 43 U/L (ref 39–117)
Bilirubin, Direct: 0.2 mg/dL (ref 0.0–0.3)
Total Bilirubin: 0.8 mg/dL (ref 0.2–1.2)
Total Protein: 6.2 g/dL (ref 6.0–8.3)

## 2021-06-17 LAB — PSA: PSA: 0.64 ng/mL (ref 0.10–4.00)

## 2021-06-19 ENCOUNTER — Encounter: Payer: Self-pay | Admitting: Family Medicine

## 2021-06-19 ENCOUNTER — Other Ambulatory Visit: Payer: Self-pay

## 2021-06-19 ENCOUNTER — Ambulatory Visit (INDEPENDENT_AMBULATORY_CARE_PROVIDER_SITE_OTHER): Payer: Medicare Other | Admitting: Family Medicine

## 2021-06-19 VITALS — BP 124/62 | HR 59 | Temp 98.0°F | Ht 66.0 in | Wt 158.8 lb

## 2021-06-19 DIAGNOSIS — E785 Hyperlipidemia, unspecified: Secondary | ICD-10-CM | POA: Diagnosis not present

## 2021-06-19 DIAGNOSIS — Z23 Encounter for immunization: Secondary | ICD-10-CM

## 2021-06-19 DIAGNOSIS — I10 Essential (primary) hypertension: Secondary | ICD-10-CM | POA: Diagnosis not present

## 2021-06-19 DIAGNOSIS — E119 Type 2 diabetes mellitus without complications: Secondary | ICD-10-CM

## 2021-06-19 DIAGNOSIS — I6350 Cerebral infarction due to unspecified occlusion or stenosis of unspecified cerebral artery: Secondary | ICD-10-CM

## 2021-06-19 NOTE — Progress Notes (Signed)
Established Patient Office Visit  Subjective:  Patient ID: James Pugh, male    DOB: 02-10-1946  Age: 75 y.o. MRN: [id number]  CC:  Chief Complaint  Patient presents with   Annual Exam    HPI [name] presents for medical follow-up.  He has remote history of CVA.  He has history of hypertension, dyslipidemia, type 2 diabetes which has been diet controlled, obstructive sleep apnea.  Generally doing well.  Does not check blood sugars regularly.  Last A1c 6.6%.  Compliant with medications.  He remains on Plavix, atorvastatin, and low-dose lisinopril.  No recent chest pains.  No dizziness.  Needs flu vaccine.  Other vaccines up-to-date.  He does plan to get by Valent COVID-vaccine soon.  Past Medical History:  Diagnosis Date   CERUMEN IMPACTION 08/22/2009   HYPERLIPIDEMIA 06/06/2009   PREDIABETES 07/13/2010   Stroke [facility])     Past Surgical History:  Procedure Laterality Date   TONSILLECTOMY      Family History  Problem Relation Age of Onset   Stroke Father    Heart disease Father    Diabetes Father        type ll   Alcohol abuse Other    Stroke Brother     Social History   Socioeconomic History   Marital status: Married    Spouse name: Not on file   Number of children: Not on file   Years of education: Not on file   Highest education level: Not on file  Occupational History   Occupation: Retired  Tobacco Use   Smoking status: Former    Packs/day: 1.00    Years: 20.00    Pack years: 20.00    Types: Cigarettes    Quit date: 08/11/1983    Years since quitting: 37.8   Smokeless tobacco: Never  Vaping Use   Vaping Use: Never used  Substance and Sexual Activity   Alcohol use: No   Drug use: No   Sexual activity: Not on file  Other Topics Concern   Not on file  Social History Narrative   Not on file   Social Determinants of Health   Financial Resource Strain: Low Risk    Difficulty of Paying Living Expenses: Not hard at all  Food Insecurity:  No Food Insecurity   Worried About Charity fundraiser in the Last Year: Never true   [place] in the Last Year: Never true  Transportation Needs: No Transportation Needs   Lack of Transportation (Medical): No   Lack of Transportation (Non-Medical): No  Physical Activity: Inactive   Days of Exercise per Week: 0 days   Minutes of Exercise per Session: 0 min  Stress: No Stress Concern Present   Feeling of Stress : Not at all  Social Connections: Moderately Integrated   Frequency of Communication with Friends and Family: Three times a week   Frequency of Social Gatherings with Friends and Family: More than three times a week   Attends Religious Services: More than 4 times per year   Active Member of [employer] or Organizations: No   Attends Archivist Meetings: Never   Marital Status: Married  Human resources officer Violence: Not At Risk   Fear of Current or Ex-Partner: No   Emotionally Abused: No   Physically Abused: No   Sexually Abused: No    Outpatient Medications Prior to Visit  Medication Sig Dispense Refill   atorvastatin (LIPITOR) 80 MG tablet Take 1 tablet (80 mg  total) by mouth daily. 90 tablet 2   clopidogrel (PLAVIX) 75 MG tablet Take 1 tablet (75 mg total) by mouth daily. 90 tablet 2   lisinopril (ZESTRIL) 2.5 MG tablet Take 1 tablet (2.5 mg total) by mouth daily. 90 tablet 2   methocarbamol (ROBAXIN) 500 MG tablet Take 1 tablet by mouth 4 (four) times daily.     No facility-administered medications prior to visit.    No Known Allergies  ROS Review of Systems  Constitutional:  Negative for fatigue.  Eyes:  Negative for visual disturbance.  Respiratory:  Negative for cough, chest tightness and shortness of breath.   Cardiovascular:  Negative for chest pain, palpitations and leg swelling.  Endocrine: Negative for polydipsia and polyuria.  Neurological:  Negative for dizziness, syncope, weakness, light-headedness and headaches.     Objective:    Physical  Exam Constitutional:      Appearance: He is well-developed.  HENT:     Right Ear: External ear normal.     Left Ear: External ear normal.  Eyes:     Pupils: Pupils are equal, round, and reactive to light.  Neck:     Thyroid: No thyromegaly.  Cardiovascular:     Rate and Rhythm: Normal rate and regular rhythm.  Pulmonary:     Effort: Pulmonary effort is normal. No respiratory distress.     Breath sounds: Normal breath sounds. No wheezing or rales.  Musculoskeletal:     Cervical back: Neck supple.     Right lower leg: No edema.     Left lower leg: No edema.  Neurological:     Mental Status: He is alert and oriented to person, place, and time.    BP 124/62 (BP Location: Left Arm, Patient Position: Sitting, Cuff Size: Normal)   Pulse (!) 59   Temp 98 F (36.7 C) (Oral)   Ht 5\' 6"  (1.676 m)   Wt 158 lb 12.8 oz (72 kg)   SpO2 96%   BMI 25.63 kg/m  Wt Readings from Last 3 Encounters:  06/19/21 158 lb 12.8 oz (72 kg)  01/28/21 159 lb (72.1 kg)  01/03/21 147 lb (66.7 kg)     Health Maintenance Due  Topic Date Due   COVID-19 Vaccine (4 - Booster for Pfizer series) 10/29/2020   OPHTHALMOLOGY EXAM  02/18/2021   Zoster Vaccines- Shingrix (2 of 2) 04/08/2021   INFLUENZA VACCINE  04/13/2021   FOOT EXAM  05/30/2021    There are no preventive care reminders to display for this patient.  Lab Results  Component Value Date   TSH 0.318 (L) 05/24/2018   Lab Results  Component Value Date   WBC 11.0 (H) 05/24/2018   HGB 13.2 05/24/2018   HCT 40.4 05/24/2018   MCV 91.2 05/24/2018   PLT 284 05/24/2018   Lab Results  Component Value Date   NA 139 06/17/2021   K 4.2 06/17/2021   CO2 30 06/17/2021   GLUCOSE 110 (H) 06/17/2021   BUN 13 06/17/2021   CREATININE 0.88 06/17/2021   BILITOT 0.8 06/17/2021   ALKPHOS 43 06/17/2021   AST 19 06/17/2021   ALT 22 06/17/2021   PROT 6.2 06/17/2021   ALBUMIN 4.0 06/17/2021   CALCIUM 9.7 06/17/2021   ANIONGAP 9 05/24/2018   GFR 83.22  06/17/2021   Lab Results  Component Value Date   CHOL 136 06/17/2021   Lab Results  Component Value Date   HDL 37.90 (L) 06/17/2021   Lab Results  Component Value Date  [place] 83 06/17/2021   Lab Results  Component Value Date   TRIG 74.0 06/17/2021   Lab Results  Component Value Date   CHOLHDL 4 06/17/2021   Lab Results  Component Value Date   HGBA1C 6.8 (H) 06/17/2021      Assessment & Plan:   #1 prior history of CVA.  Continue good secondary prevention.  He is slightly above goals today with regard to LDL cholesterol 83 and A1c of 6.8.  He declines further medications at this time  #2 type 2 diabetes.  A1c 6.8%.  We discussed possible low-dose metformin but he declines.  He prefers to give this 3 more months and then reassess  #3 dyslipidemia.  Goal LDL less than 70.  We discussed possible addition of Zetia to his atorvastatin but he declines. Continue low saturated fat diet  #4 hypertension stable and at goal.  Continue low-dose lisinopril.  #5 health maintenance-high-dose influenza vaccine given.   No orders of the defined types were placed in this encounter.   Follow-up: Return in about 3 months (around 09/19/2021).    Carolann Littler, MD

## 2021-07-09 ENCOUNTER — Other Ambulatory Visit: Payer: Self-pay

## 2021-07-09 ENCOUNTER — Emergency Department (HOSPITAL_COMMUNITY)
Admission: EM | Admit: 2021-07-09 | Discharge: 2021-07-09 | Disposition: A | Discharge status: Home / Self Care | Payer: Medicare Other | Attending: Emergency Medicine | Admitting: Emergency Medicine

## 2021-07-09 ENCOUNTER — Encounter (HOSPITAL_COMMUNITY): Payer: Self-pay

## 2021-07-09 DIAGNOSIS — I10 Essential (primary) hypertension: Secondary | ICD-10-CM | POA: Diagnosis not present

## 2021-07-09 DIAGNOSIS — E119 Type 2 diabetes mellitus without complications: Secondary | ICD-10-CM | POA: Diagnosis not present

## 2021-07-09 DIAGNOSIS — H1033 Unspecified acute conjunctivitis, bilateral: Secondary | ICD-10-CM | POA: Diagnosis not present

## 2021-07-09 DIAGNOSIS — R058 Other specified cough: Secondary | ICD-10-CM

## 2021-07-09 DIAGNOSIS — H579 Unspecified disorder of eye and adnexa: Secondary | ICD-10-CM | POA: Diagnosis not present

## 2021-07-09 DIAGNOSIS — Z87891 Personal history of nicotine dependence: Secondary | ICD-10-CM | POA: Diagnosis not present

## 2021-07-09 DIAGNOSIS — Z7902 Long term (current) use of antithrombotics/antiplatelets: Secondary | ICD-10-CM | POA: Diagnosis not present

## 2021-07-09 DIAGNOSIS — Z79899 Other long term (current) drug therapy: Secondary | ICD-10-CM | POA: Diagnosis not present

## 2021-07-09 DIAGNOSIS — R059 Cough, unspecified: Secondary | ICD-10-CM | POA: Diagnosis not present

## 2021-07-09 MED ORDER — HYDROCOD POLST-CPM POLST ER 10-8 MG/5ML PO SUER: 5.0000 mL | Freq: Two times a day (BID) | ORAL | 0 refills | Status: DC | PRN

## 2021-07-09 MED ORDER — TOBRAMYCIN 0.3 % OP SOLN
2.0000 [drp] | Freq: Once | OPHTHALMIC | Status: AC
  Administered 2021-07-09: 2 [drp] via OPHTHALMIC
  Filled 2021-07-09: qty 5

## 2021-07-09 NOTE — Discharge Instructions (Addendum)
It was our pleasure to provide your ER care today - we hope that you feel better.  Use tobrex eye drops, 2 drops into each eye 4x/day for one week. If matting/drainage, gently wipe with warm washcloth as need.   Follow up with eye doctor in one week if symptoms fail to improve/resolve.  May take tussionex as need for cough - no driving when taking.   Your covid/flu test result should be back in the next 2-3 hours - you may check My Chart or call for results.   Your blood pressure is high - continue meds and follow up with primary care doctor in 1-2 weeks.   Return to ER if worse, new symptoms, severe eye pain, change in vision, trouble breathing, or other concern.

## 2021-07-09 NOTE — ED Provider Notes (Signed)
[facility] EMERGENCY DEPARTMENT Provider Note   CSN: [id number] Arrival date & time: 07/09/21  1830     History Chief Complaint  Patient presents with   Conjunctivitis    James Pugh is a 75 y.o. male.  Patient c/o bilateral eye irration, redness, matting of lashes/yellowish drainage. Symptoms acute onset 3 days ago, moderate, constant, persistent. Denies contact use. No trauma, chemical or fb to eyes. No hx same. No change in vision or severe eye pain. +recent non prod cough, congestion. No sore throat. No trouble breathing, no chest discomfort. 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   The history is provided by the patient, the spouse and medical records.  Conjunctivitis Pertinent negatives include no chest pain, no abdominal pain, no headaches and no shortness of breath.      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   Acute CVA (cerebrovascular accident) ([place])    Dysarthria, post-stroke    Hyperlipidemia    Diabetes mellitus type 2 in nonobese ([place])    Benign essential HTN    Status post cataract extraction    OSA (obstructive sleep apnea)    CVA (cerebral vascular accident) ([place]) 10/22/2017   CERUMEN IMPACTION 08/22/2009   Dyslipidemia 06/06/2009    Past Surgical History:  Procedure Laterality Date   TONSILLECTOMY         Family History  Problem Relation Age of Onset   Stroke Father    Heart disease Father    Diabetes Father        type ll   Alcohol abuse Other    Stroke  Brother     Social History   Tobacco Use   Smoking status: Former    Packs/day: 1.00    Years: 20.00    Pack years: 20.00    Types: Cigarettes    Quit date: 08/11/1983    Years since quitting: 37.9   Smokeless tobacco: Never  Vaping Use   Vaping Use: Never used  Substance Use Topics   Alcohol use: No   Drug use: No    Home Medications Prior to Admission medications   Medication Sig Start Date End Date Taking? Authorizing Provider  atorvastatin (LIPITOR) 80 MG tablet Take 1 tablet (80 mg total) by mouth daily. 11/26/20   Burchette, Alinda Sierras, MD  clopidogrel (PLAVIX) 75 MG tablet Take 1 tablet (75 mg total) by mouth daily. 11/26/20   Burchette, Alinda Sierras, MD  lisinopril (ZESTRIL) 2.5 MG tablet Take 1 tablet (2.5 mg total) by mouth daily. 11/26/20   Burchette, Alinda Sierras, MD    Allergies    Patient has no known allergies.  Review of Systems   Review of Systems  Constitutional:  Negative for fever.  HENT:  Positive for congestion. Negative for sore throat.   Eyes:  Positive for discharge and redness.  Respiratory:  Positive for cough. Negative for shortness of breath.   Cardiovascular:  Negative for chest pain.  Gastrointestinal:  Negative for abdominal pain.  Genitourinary:  Negative for flank pain.  Musculoskeletal:  Negative for neck pain.  Skin:  Negative for rash.  Neurological:  Negative for headaches.  Hematological:  Does not bruise/bleed easily.  Psychiatric/Behavioral:  Negative for confusion.    Physical Exam Updated Vital Signs BP (!) 173/79 (BP Location: Right Arm)   Pulse 64   Temp 98 F (36.7 C) (Oral)   Resp 18   Ht 1.676 m (5\' 6" )   Wt 72.1 kg   SpO2 99%   BMI 25.66 kg/m   Physical Exam Vitals and nursing note reviewed.  Constitutional:      Appearance: Normal appearance. He is well-developed.  HENT:     Head: Atraumatic.     Nose: Nose normal.     Mouth/Throat:     Mouth: Mucous membranes are moist.  Eyes:     General: No scleral icterus.        Right eye: Discharge present.        Left eye: Discharge present.    Extraocular Movements: Extraocular movements intact.     Pupils: Pupils are equal, round, and reactive to light.     Comments: +conjunctivitis. +yellowish drainage/matting of lids.   Neck:     Trachea: No tracheal deviation.  Cardiovascular:     Rate and Rhythm: Normal rate and regular rhythm.     Pulses: Normal pulses.     Heart sounds: Normal heart sounds.  Pulmonary:     Effort: Pulmonary effort is normal. No accessory muscle usage or respiratory distress.     Breath sounds: Normal breath sounds.  Abdominal:     General: There is no distension.  Genitourinary:    Comments: No cva tenderness. Musculoskeletal:        General: No swelling.     Cervical back: Normal range of motion and neck supple. No rigidity.  Lymphadenopathy:     Cervical: No cervical adenopathy.  Skin:    General: Skin is warm and dry.     Findings: No rash.  Neurological:     Mental Status: He is alert.     Comments: Alert, speech clear.   Psychiatric:        Mood and Affect: Mood normal.    ED Results / Procedures / Treatments   Labs (all labs ordered are listed, but only abnormal results are displayed) Labs Reviewed  RESP PANEL BY RT-PCR (FLU A&B, COVID) [id number]    EKG None  Radiology No results found.  Procedures Procedures   Medications Ordered in ED Medications  tobramycin (TOBREX) 0.3 % ophthalmic solution 2 drop (has no administration in time range)    ED Course  I have reviewed the triage vital signs and the nursing notes.  Pertinent labs & imaging results that were available during my care of the patient were reviewed by me and considered in my medical decision making (see chart for details).    MDM Rules/Calculators/A&P                          Exam c/w conjunctivitis. Confirmed nkda.   Reviewed nursing notes and prior charts for additional history.   Tobrex gtts and rx for home.   Pt/spouse also  request rx tussionex for non prod cough - will provide per request. Chest cta. Pulse ox 99% room air.   Covid/flu swab sent.   Pt currently appears stable for d/c.    Final Clinical Impression(s) / ED Diagnoses Final diagnoses:  None  Rx / DC Orders ED Discharge Orders     None        Lajean Saver, MD 07/09/21 [phone]

## 2021-07-09 NOTE — ED Triage Notes (Signed)
Bilateral conjunctivitis since Tuesday. A little better today. Has been using liquid tears. Eyes started itching today. Has had drainage since Tuesday too

## 2021-08-12 DIAGNOSIS — H53001 Unspecified amblyopia, right eye: Secondary | ICD-10-CM | POA: Diagnosis not present

## 2021-08-12 DIAGNOSIS — H52203 Unspecified astigmatism, bilateral: Secondary | ICD-10-CM | POA: Diagnosis not present

## 2021-08-12 DIAGNOSIS — Z961 Presence of intraocular lens: Secondary | ICD-10-CM | POA: Diagnosis not present

## 2021-09-01 ENCOUNTER — Ambulatory Visit: Payer: Medicare Other

## 2021-09-02 DIAGNOSIS — D1801 Hemangioma of skin and subcutaneous tissue: Secondary | ICD-10-CM | POA: Diagnosis not present

## 2021-09-02 DIAGNOSIS — D225 Melanocytic nevi of trunk: Secondary | ICD-10-CM | POA: Diagnosis not present

## 2021-09-02 DIAGNOSIS — Z85828 Personal history of other malignant neoplasm of skin: Secondary | ICD-10-CM | POA: Diagnosis not present

## 2021-09-02 DIAGNOSIS — L570 Actinic keratosis: Secondary | ICD-10-CM | POA: Diagnosis not present

## 2021-09-02 DIAGNOSIS — L821 Other seborrheic keratosis: Secondary | ICD-10-CM | POA: Diagnosis not present

## 2021-09-08 ENCOUNTER — Telehealth: Payer: Self-pay | Admitting: Family Medicine

## 2021-09-08 NOTE — Telephone Encounter (Signed)
Spoke with spouse Programmer, applications) to schedule Medicare Annual Wellness Visit (AWV) either virtually or in office. Left  my Herbie Drape number [phone]  Wcb to schedule  going out of town    Last [place] 08/26/20  please schedule at anytime with [facility] Nurse Health Advisor 1 or 2   This should be a 45 minute visit.

## 2021-10-07 DIAGNOSIS — Z1152 Encounter for screening for COVID-19: Secondary | ICD-10-CM | POA: Diagnosis not present

## 2021-10-07 DIAGNOSIS — Z20822 Contact with and (suspected) exposure to covid-19: Secondary | ICD-10-CM | POA: Diagnosis not present

## 2021-10-16 ENCOUNTER — Other Ambulatory Visit: Payer: Self-pay | Admitting: Family Medicine

## 2021-10-27 ENCOUNTER — Ambulatory Visit (INDEPENDENT_AMBULATORY_CARE_PROVIDER_SITE_OTHER): Payer: Medicare Other

## 2021-10-27 VITALS — Ht 66.0 in | Wt 158.0 lb

## 2021-10-27 DIAGNOSIS — Z0000 Encounter for general adult medical examination without abnormal findings: Secondary | ICD-10-CM | POA: Diagnosis not present

## 2021-10-27 NOTE — Patient Instructions (Addendum)
James Pugh , Thank you for taking time to come for your Medicare Wellness Visit. I appreciate your ongoing commitment to your health goals. Please review the following plan we discussed and let me know if I can assist you in the future.   These are the goals we discussed:  Goals       Patient Stated (pt-stated)      Maintain healthy.         This is a list of the screening recommended for you and due dates:  Health Maintenance  Topic Date Due   COVID-19 Vaccine (4 - Booster for [facility] series) 08/23/2020   Eye exam for diabetics  02/18/2021   Zoster (Shingles) Vaccine (2 of 2) 04/08/2021   Complete foot exam   05/30/2021   Hemoglobin A1C  12/16/2021   Tetanus Vaccine  11/10/2024   Pneumonia Vaccine  Completed   Flu Shot  Completed   Hepatitis C Screening: USPSTF Recommendation to screen - Ages 18-79 yo.  Completed   HPV Vaccine  Aged Out   Colon Cancer Screening  Discontinued   Advanced directives: Yes Patient will bring copy  Conditions/risks identified: None  Next appointment: Follow up in one year for your annual wellness visit    Preventive Care 65 Years and Older, Male Preventive care refers to lifestyle choices and visits with your health care provider that can promote health and wellness. What does preventive care include? A yearly physical exam. This is also called an annual well check. Dental exams once or twice a year. Routine eye exams. Ask your health care provider how often you should have your eyes checked. Personal lifestyle choices, including: Daily care of your teeth and gums. Regular physical activity. Eating a healthy diet. Avoiding tobacco and drug use. Limiting alcohol use. Practicing safe sex. Taking low-dose aspirin every day. Taking vitamin and mineral supplements as recommended by your health care provider. What happens during an annual well check? The services and screenings done by your health care provider during your annual well check  will depend on your age, overall health, lifestyle risk factors, and family history of disease. Counseling  Your health care provider may ask you questions about your: Alcohol use. Tobacco use. Drug use. Emotional well-being. Home and relationship well-being. Sexual activity. Eating habits. History of falls. Memory and ability to understand (cognition). Work and work Statistician. Reproductive health. Screening  You may have the following tests or measurements: Height, weight, and BMI. Blood pressure. Lipid and cholesterol levels. These may be checked every 5 years, or more frequently if you are over 34 years old. Skin check. Lung cancer screening. You may have this screening every year starting at age 79 if you have a 30-pack-year history of smoking and currently smoke or have quit within the past 15 years. Fecal occult blood test (FOBT) of the stool. You may have this test every year starting at age 54. Flexible sigmoidoscopy or colonoscopy. You may have a sigmoidoscopy every 5 years or a colonoscopy every 10 years starting at age 57. Hepatitis C blood test. Hepatitis B blood test. Sexually transmitted disease (STD) testing. Diabetes screening. This is done by checking your blood sugar (glucose) after you have not eaten for a while (fasting). You may have this done every 1-3 years. Bone density scan. This is done to screen for osteoporosis. You may have this done starting at age 85. Mammogram. This may be done every 1-2 years. Talk to your health care provider about how often you should  have regular mammograms. Talk with your health care provider about your test results, treatment options, and if necessary, the need for more tests. Vaccines  Your health care provider may recommend certain vaccines, such as: Influenza vaccine. This is recommended every year. Tetanus, diphtheria, and acellular pertussis (Tdap, Td) vaccine. You may need a Td booster every 10 years. Zoster vaccine. You  may need this after age 58. Pneumococcal 13-valent conjugate (PCV13) vaccine. One dose is recommended after age 13. Pneumococcal polysaccharide (PPSV23) vaccine. One dose is recommended after age 91. Talk to your health care provider about which screenings and vaccines you need and how often you need them. This information is not intended to replace advice given to you by your health care provider. Make sure you discuss any questions you have with your health care provider. Document Released: 09/26/2015 Document Revised: 05/19/2016 Document Reviewed: 07/01/2015 Elsevier Interactive Patient Education  2017 [employer] Prevention in the Home Falls can cause injuries. They can happen to people of all ages. There are many things you can do to make your home safe and to help prevent falls. What can I do on the outside of my home? Regularly fix the edges of walkways and driveways and fix any cracks. Remove anything that might make you trip as you walk through a door, such as a raised step or threshold. Trim any bushes or trees on the path to your home. Use bright outdoor lighting. Clear any walking paths of anything that might make someone trip, such as rocks or tools. Regularly check to see if handrails are loose or broken. Make sure that both sides of any steps have handrails. Any raised decks and porches should have guardrails on the edges. Have any leaves, snow, or ice cleared regularly. Use sand or salt on walking paths during winter. Clean up any spills in your garage right away. This includes oil or grease spills. What can I do in the bathroom? Use night lights. Install grab bars by the toilet and in the tub and shower. Do not use towel bars as grab bars. Use non-skid mats or decals in the tub or shower. If you need to sit down in the shower, use a plastic, non-slip stool. Keep the floor dry. Clean up any water that spills on the floor as soon as it happens. Remove soap buildup  in the tub or shower regularly. Attach bath mats securely with double-sided non-slip rug tape. Do not have throw rugs and other things on the floor that can make you trip. What can I do in the bedroom? Use night lights. Make sure that you have a light by your bed that is easy to reach. Do not use any sheets or blankets that are too big for your bed. They should not hang down onto the floor. Have a firm chair that has side arms. You can use this for support while you get dressed. Do not have throw rugs and other things on the floor that can make you trip. What can I do in the kitchen? Clean up any spills right away. Avoid walking on wet floors. Keep items that you use a lot in easy-to-reach places. If you need to reach something above you, use a strong step stool that has a grab bar. Keep electrical cords out of the way. Do not use floor polish or wax that makes floors slippery. If you must use wax, use non-skid floor wax. Do not have throw rugs and other things on the floor  that can make you trip. What can I do with my stairs? Do not leave any items on the stairs. Make sure that there are handrails on both sides of the stairs and use them. Fix handrails that are broken or loose. Make sure that handrails are as long as the stairways. Check any carpeting to make sure that it is firmly attached to the stairs. Fix any carpet that is loose or worn. Avoid having throw rugs at the top or bottom of the stairs. If you do have throw rugs, attach them to the floor with carpet tape. Make sure that you have a light switch at the top of the stairs and the bottom of the stairs. If you do not have them, ask someone to add them for you. What else can I do to help prevent falls? Wear shoes that: Do not have high heels. Have rubber bottoms. Are comfortable and fit you well. Are closed at the toe. Do not wear sandals. If you use a stepladder: Make sure that it is fully opened. Do not climb a closed  stepladder. Make sure that both sides of the stepladder are locked into place. Ask someone to hold it for you, if possible. Clearly mark and make sure that you can see: Any grab bars or handrails. First and last steps. Where the edge of each step is. Use tools that help you move around (mobility aids) if they are needed. These include: Canes. Walkers. Scooters. Crutches. Turn on the lights when you go into a dark area. Replace any light bulbs as soon as they burn out. Set up your furniture so you have a clear path. Avoid moving your furniture around. If any of your floors are uneven, fix them. If there are any pets around you, be aware of where they are. Review your medicines with your doctor. Some medicines can make you feel dizzy. This can increase your chance of falling. Ask your doctor what other things that you can do to help prevent falls. This information is not intended to replace advice given to you by your health care provider. Make sure you discuss any questions you have with your health care provider. Document Released: 06/26/2009 Document Revised: 02/05/2016 Document Reviewed: 10/04/2014 Elsevier Interactive Patient Education  2017 [employer].

## 2021-10-27 NOTE — Progress Notes (Signed)
Subjective:   James Pugh is a 76 y.o. male who presents for Medicare Annual/Subsequent preventive examination.  Review of Systems    Virtual Visit via Telephone Note  I connected with  ORIEN MAYHALL on 10/27/21 at  9:45 AM EST by telephone and verified that I am speaking with the correct person using two identifiers.  Location: Patient: Home Provider: Office Persons participating in the virtual visit: patient/Nurse Health Advisor   I discussed the limitations, risks, security and privacy concerns of performing an evaluation and management service by telephone and the availability of in person appointments. The patient expressed understanding and agreed to proceed.  Interactive audio and video telecommunications were attempted between this nurse and patient, however failed, due to patient having technical difficulties OR patient did not have access to video capability.  We continued and completed visit with audio only.  Some vital signs may be absent or patient reported.   Criselda Peaches, LPN  Cardiac Risk Factors include: advanced age (>80men, >73 women);hypertension;male gender     Objective:    Today's Vitals   10/27/21 0924  Weight: 158 lb (71.7 kg)  Height: 5\' 6"  (1.676 m)   Body mass index is 25.5 kg/m.  Advanced Directives 10/27/2021 07/09/2021 01/03/2021 08/26/2020 05/24/2018 05/24/2018 11/08/2017  Does Patient Have a Medical Advance Directive? Yes No Yes Yes Yes Yes Yes  Type of Paramedic of [place];Living will - [place];Living will [place];Living will [place] will  Does patient want to make changes to medical advance directive? No - Patient declined - No - Patient declined - No - Patient declined No - Patient declined -  Copy of [place] in Chart? No - copy requested - No - copy requested No - copy requested - - -  Would patient like information  on creating a medical advance directive? - - - - - - -    Current Medications (verified) Outpatient Encounter Medications as of 10/27/2021  Medication Sig   atorvastatin (LIPITOR) 80 MG tablet TAKE 1 TABLET BY MOUTH EVERY DAY   chlorpheniramine-HYDROcodone (TUSSIONEX PENNKINETIC ER) 10-8 MG/5ML SUER Take 5 mLs by mouth every 12 (twelve) hours as needed for cough.   clopidogrel (PLAVIX) 75 MG tablet TAKE 1 TABLET BY MOUTH EVERY DAY   lisinopril (ZESTRIL) 2.5 MG tablet TAKE 1 TABLET BY MOUTH EVERY DAY   No facility-administered encounter medications on file as of 10/27/2021.    Allergies (verified) Patient has no known allergies.   History: Past Medical History:  Diagnosis Date   CERUMEN IMPACTION 08/22/2009   HYPERLIPIDEMIA 06/06/2009   PREDIABETES 07/13/2010   Stroke [facility])    Past Surgical History:  Procedure Laterality Date   TONSILLECTOMY     Family History  Problem Relation Age of Onset   Stroke Father    Heart disease Father    Diabetes Father        type ll   Alcohol abuse Other    Stroke Brother    Social History   Socioeconomic History   Marital status: Married    Spouse name: Not on file   Number of children: Not on file   Years of education: Not on file   Highest education level: Not on file  Occupational History   Occupation: Retired  Tobacco Use   Smoking status: Former    Packs/day: 1.00    Years: 20.00    Pack years: 20.00  Types: Cigarettes    Quit date: 08/11/1983    Years since quitting: 38.2   Smokeless tobacco: Never  Vaping Use   Vaping Use: Never used  Substance and Sexual Activity   Alcohol use: No   Drug use: No   Sexual activity: Not on file  Other Topics Concern   Not on file  Social History Narrative   Not on file   Social Determinants of Health   Financial Resource Strain: Low Risk    Difficulty of Paying Living Expenses: Not hard at all  Food Insecurity: No Food Insecurity   Worried About Charity fundraiser in the  Last Year: Never true   [place] in the Last Year: Never true  Transportation Needs: No Transportation Needs   Lack of Transportation (Medical): No   Lack of Transportation (Non-Medical): No  Physical Activity: Insufficiently Active   Days of Exercise per Week: 3 days   Minutes of Exercise per Session: 30 min  Stress: No Stress Concern Present   Feeling of Stress : Not at all  Social Connections: Socially Integrated   Frequency of Communication with Friends and Family: More than three times a week   Frequency of Social Gatherings with Friends and Family: More than three times a week   Attends Religious Services: More than 4 times per year   Active Member of [employer] or Organizations: Yes   Attends Archivist Meetings: More than 4 times per year   Marital Status: Married     Clinical Intake: How often do you need to have someone help you when you read instructions, pamphlets, or other written materials from your doctor or pharmacy?: 1 - Never  Diabetic? No  Activities of Daily Living In your present state of health, do you have any difficulty performing the following activities: 10/27/2021  Hearing? N  Vision? N  Difficulty concentrating or making decisions? N  Walking or climbing stairs? N  Dressing or bathing? N  Doing errands, shopping? N  Preparing Food and eating ? N  Using the Toilet? N  In the past six months, have you accidently leaked urine? N  Do you have problems with loss of bowel control? N  Managing your Medications? N  Managing your Finances? N  Housekeeping or managing your Housekeeping? N  Some recent data might be hidden    Patient Care Team: [name], MD as PCP - General Duffy, Creola Corn, LCSW as Social Worker (Licensed Clinical Social Worker)  Indicate any recent [employer] you may have received from other than [facility] providers in the past year (date may be approximate).     Assessment:   This is a routine wellness  examination for James Pugh.  Hearing/Vision screen Hearing Screening - Comments:: No hearing difficulty Vision Screening - Comments:: Wears glasses as needed. Followed by Dr Prudencio Burly  Dietary issues and exercise activities discussed: Current Exercise Habits: Home exercise routine, Type of exercise: walking;stretching, Time (Minutes): 30, Frequency (Times/Week): 3, Weekly Exercise (Minutes/Week): 90, Intensity: Moderate, Exercise limited by: None identified   Goals Addressed               This Visit's Progress     Patient Stated (pt-stated)        Maintain healthy.        Depression Screen PHQ 2/9 Scores 10/27/2021 08/26/2020 05/14/2019 11/14/2017 10/26/2016 11/11/2014 11/11/2014  PHQ - 2 Score 0 0 0 0 0 0 0  PHQ- 9 Score - - 0 0 - - -  Fall Risk Fall Risk  10/27/2021 01/28/2021 08/26/2020 05/14/2019 07/06/2018  Falls in the past year? 0 0 0 0 No  Number falls in past yr: 0 - 0 - -  Injury with Fall? 0 - 0 - -  Risk for fall due to : No Fall Risks - Impaired vision - -  Follow up - - Falls prevention discussed - -    FALL RISK PREVENTION PERTAINING TO THE HOME:  Any stairs in or around the home? Yes  If so, are there any without handrails? No  Home free of loose throw rugs in walkways, pet beds, electrical cords, etc? Yes  Adequate lighting in your home to reduce risk of falls? Yes   ASSISTIVE DEVICES UTILIZED TO PREVENT FALLS:  Life alert? No  Use of a cane, walker or w/c? No  Grab bars in the bathroom? No  Shower chair or bench in shower? No  Elevated toilet seat or a handicapped toilet? Yes   TIMED UP AND GO:  Was the test performed? No . Audio Visit  Cognitive Function:     6CIT Screen 10/27/2021 08/26/2020  What Year? 0 points 0 points  What month? 0 points 0 points  What time? 0 points -  Count back from 20 0 points 0 points  Months in reverse 0 points 0 points  Repeat phrase 0 points 0 points  Total Score 0 -    Immunizations Immunization History   Administered Date(s) Administered   Fluad Quad(high Dose 65+) 05/14/2019, 05/30/2020, 06/19/2021   Hepatitis A 06/06/2009   Influenza Split 08/11/2011, 06/21/2013   Influenza Whole 06/06/2009, 07/13/2010   Influenza, High Dose Seasonal PF 06/06/2018   Influenza-Unspecified 08/02/2012   PFIZER(Purple Top)SARS-COV-2 Vaccination 10/16/2019, 11/06/2019, 06/28/2020   PNEUMOCOCCAL CONJUGATE-20 05/25/2021   Pneumococcal Conjugate-13 10/24/2013   Pneumococcal Polysaccharide-23 06/06/2009   Td 09/13/2004   Tdap 11/11/2014   Zoster Recombinat (Shingrix) 02/11/2021    TDAP status: Up to date  Flu Vaccine status: Up to date  Pneumococcal vaccine status: Up to date  Covid-19 vaccine status: Completed vaccines  Qualifies for Shingles Vaccine? Yes   Zostavax completed Yes   Shingrix Completed?: Yes  Screening Tests Health Maintenance  Topic Date Due   COVID-19 Vaccine (4 - Booster for Pfizer series) 08/23/2020   OPHTHALMOLOGY EXAM  02/18/2021   Zoster Vaccines- Shingrix (2 of 2) 04/08/2021   FOOT EXAM  05/30/2021   HEMOGLOBIN A1C  12/16/2021   TETANUS/TDAP  11/10/2024   Pneumonia Vaccine 25+ Years old  Completed   INFLUENZA VACCINE  Completed   Hepatitis C Screening  Completed   HPV VACCINES  Aged Out   COLONOSCOPY (Pts 45-63yrs Insurance coverage will need to be confirmed)  [place] Maintenance Due  Topic Date Due   COVID-19 Vaccine (4 - Booster for [place] series) 08/23/2020   OPHTHALMOLOGY EXAM  02/18/2021   Zoster Vaccines- Shingrix (2 of 2) 04/08/2021   FOOT EXAM  05/30/2021    Colorectal cancer screening: No longer required.   Lung Cancer Screening: (Low Dose CT Chest recommended if Age 51-80 years, 30 pack-year currently smoking OR have quit w/in 15years.) does not qualify.     Additional Screening:  Hepatitis C Screening: does not qualify; Completed 07/28/16  Vision Screening: Recommended annual ophthalmology exams for early  detection of glaucoma and other disorders of the eye. Is the patient up to date with their annual eye exam?  Yes  Who is the provider or what  is the name of the office in which the patient attends annual eye exams? Dr Prudencio Burly If pt is not established with a provider, would they like to be referred to a provider to establish care? No .   Dental Screening: Recommended annual dental exams for proper oral hygiene  Community Resource Referral / Chronic Care Management:  CRR required this visit?  No   CCM required this visit?  No      Plan:     I have personally reviewed and noted the following in the patients chart:   Medical and social history Use of alcohol, tobacco or illicit drugs  Current medications and supplements including opioid prescriptions. Patient is not currently taking opioid prescriptions. Functional ability and status Nutritional status Physical activity Advanced directives List of other physicians Hospitalizations, surgeries, and ER visits in previous 12 months Vitals Screenings to include cognitive, depression, and falls Referrals and appointments  In addition, I have reviewed and discussed with patient certain preventive protocols, quality metrics, and best practice recommendations. A written personalized care plan for preventive services as well as general preventive health recommendations were provided to patient.     Criselda Peaches, LPN   0/51/1021   Nurse Notes: None

## 2021-11-20 DIAGNOSIS — Z20822 Contact with and (suspected) exposure to covid-19: Secondary | ICD-10-CM | POA: Diagnosis not present

## 2021-12-31 DIAGNOSIS — Z20822 Contact with and (suspected) exposure to covid-19: Secondary | ICD-10-CM | POA: Diagnosis not present

## 2022-01-09 DIAGNOSIS — Z20822 Contact with and (suspected) exposure to covid-19: Secondary | ICD-10-CM | POA: Diagnosis not present

## 2022-01-15 DIAGNOSIS — Z20822 Contact with and (suspected) exposure to covid-19: Secondary | ICD-10-CM | POA: Diagnosis not present

## 2022-01-16 DIAGNOSIS — Z20822 Contact with and (suspected) exposure to covid-19: Secondary | ICD-10-CM | POA: Diagnosis not present

## 2022-01-18 DIAGNOSIS — Z20822 Contact with and (suspected) exposure to covid-19: Secondary | ICD-10-CM | POA: Diagnosis not present

## 2022-01-20 DIAGNOSIS — Z20828 Contact with and (suspected) exposure to other viral communicable diseases: Secondary | ICD-10-CM | POA: Diagnosis not present

## 2022-01-24 ENCOUNTER — Encounter: Payer: Self-pay | Admitting: Neurology

## 2022-01-26 ENCOUNTER — Encounter: Payer: Self-pay | Admitting: Neurology

## 2022-01-26 ENCOUNTER — Ambulatory Visit (INDEPENDENT_AMBULATORY_CARE_PROVIDER_SITE_OTHER): Payer: Medicare Other | Admitting: Neurology

## 2022-01-26 VITALS — BP 115/61 | HR 59 | Ht 66.0 in | Wt 157.6 lb

## 2022-01-26 DIAGNOSIS — R413 Other amnesia: Secondary | ICD-10-CM

## 2022-01-26 DIAGNOSIS — R4789 Other speech disturbances: Secondary | ICD-10-CM | POA: Diagnosis not present

## 2022-01-26 DIAGNOSIS — G4733 Obstructive sleep apnea (adult) (pediatric): Secondary | ICD-10-CM | POA: Diagnosis not present

## 2022-01-26 NOTE — Patient Instructions (Addendum)
Please continue using your BiPAP regularly. While your insurance requires that you use BiPAP at least 4 hours each night on 70% of the nights, I recommend, that you not skip any nights and use it throughout the night if you can. Getting used to BiPAP and staying with the treatment long term does take time and patience and discipline. Untreated obstructive sleep apnea when it is moderate to severe can have an adverse impact on cardiovascular health and raise her risk for heart disease, arrhythmias, hypertension, congestive heart failure, stroke and diabetes. Untreated obstructive sleep apnea causes sleep disruption, nonrestorative sleep, and sleep deprivation. This can have an impact on your day to day functioning and cause daytime sleepiness and impairment of cognitive function, memory loss, mood disturbance, and problems focussing. Using BiPAP regularly can improve these symptoms. ?We will request a formal cognitive test called neuropsychological evaluation which is done by a licensed neuropsychologist. We will make a referral in that regard and their office will call to schedule you for an appointment. Please be reminded, that this is a lengthy appointment.  I have made a referral to Dr. Ilean Skill.  If you do not hear back from his office within the next 2 weeks, please call us or email [place] through English. ?

## 2022-01-26 NOTE — Progress Notes (Signed)
Subjective:  ?  ?Patient ID: James Pugh is a 76 y.o. male. ? ?HPI ? ? ? ?Interim history:  ? ?James Pugh is a 76 year old right-handed gentleman with an underlying medical history of diabetes, R pontine stroke, hyperlipidemia, and overweight state, who presents for follow-up consultation of his obstructive sleep apnea, on treatment with BiPAP. The patient is accompanied by his wife today and presents for his yearly check up. I last saw him on 01/28/2021, at which time he was compliant with his BiPAP but reported that he did not sleep well through the night.  His wife was worried about his short-term memory.  We proceeded with a brain MRI.  He had a brain MRI without contrast on 02/11/2021 and I reviewed the results: IMPRESSION:  ?  ?MRI brain without contrast demonstrating: ?-Mild perisylvian atrophy. ?-Mild chronic small vessel ischemic disease. ?-Chronic right paramedian pontine ischemic infarct. ?  ?They were notified via phone call. ? ?Today, 01/26/2022: I reviewed his BiPAP compliance data from 12/26/2021 through 01/24/2022, which is a total of 30 days, during which time he used his machine 29 days with percent use days greater than 4 hours at 93%, indicating ?Compliance with an average usage of 6 hours and 48 minutes, residual AHI at goal at 1/h, leak acceptable with the 95th percentile at 11.6 L/min on a pressure of 17/13 cm.  He reports from doing well, stable on his BiPAP, up-to-date with supplies, still having short-term memory issues such as forgetting recent conversations, name recall is an issue as well and word finding difficulty.  Per wife, he is very good with directions and driving.  His mom had dementia in her later years, in her 72s, she lived to be nearly 96.  He hydrates well with water.  Had recent issues with cough for which she was treated with Tessalon, no antibiotics or steroids.  He had to skip a couple of nights with his BiPAP because of cough. ?  ?The patient's allergies, current  medications, family history, past medical history, past social history, past surgical history and problem list were reviewed and updated as appropriate.  ?  ?Previously:  ? ? ?I saw him on 11/06/18, at which time he was compliant with treatment and BiPAP therapy.  He was advised to follow-up in 1 year. ?  ?He saw [name], NP in the interim on 12/02/2019, at which time he continued to do well on BiPAP of 17/13. ?  ?I reviewed his BiPAP compliance data from 12/27/2020 through 01/25/2021, which is a total of 30 days, during which time he used his machine 28 days with percent use days greater than 4 hours at 87%, indicating very good compliance with an average usage for days on treatment of 6 hours and 30 minutes, residual AHI at goal at 0.4/h, leak on the low side, with the 95th percentile at 7.3 L/min on a pressure of 17/13 centimeters.   ?  ?  ?I saw him on 05/03/2018, at which time he was able to tolerate the BiPAP pressure. He was sleeping better at night per wife's report. His residual sleep disordered breathing improved after we increase the pressure to 16/12. I suggested we increase it further to 17/13 due to residual AHI around 10 at the time. ?  ?I reviewed his compliance data in the interim and AHI had improved. He was advised to continue with the pressure of 17/13 cm. ?  ?He was seen in the interim in stroke clinic for follow-up on 07/06/2018. ?  ?  I reviewed his BiPAP compliance data from 10/03/2018 through 11/01/2018 which is a total of 30 days, during which time he used his BiPAP every night except for 1, with percent used days greater than 4 hours at 97%, indicating excellent compliance with an average usage of 7 hours and 4 minutes, residual AHI at goal at 1.9 per hour, leak on the low side with the 95th percentile at 2.1 L/m on a pressure of 17/13.  ?  ?I saw him on 03/22/2018, at which time he was compliant with BiPAP but his residual AHI was around 15. I suggested we increase the pressure. He did  report that sometimes it felt like he was not getting enough air in. Overall, he felt an improvement in his sleep quality and his wife noted that his daytime energy was better.  ?  ?I reviewed his BiPAP compliance data from 04/02/2018 through 05/01/2018 which is a total of 30 days, during which time he used his BiPAP every night with percent used days greater than 4 hours at 100%, indicating superb compliance with an average usage of 6 hours and 40 minutes, residual AHI slightly suboptimal still at 10 per hour but improved from before, leak low with the 95th percentile at 3.4 L/m on a pressure of 16/12 cm.  ?  ?I first met him on 11/14/2017 at the request of Dr. Erlinda Hong, at which time he was reported to have witnessed breathing pauses while asleep, snoring and excessive daytime somnolence. He was advised to proceed with sleep study testing. He had a split-night sleep study on 12/20/2017. I went over his test results with him in detail today. He had significant sleep disruption at baseline, he had severe events, particularly in rem sleep. He did fairly well on CPAP but did have difficulty maintaining sleep later on. He was briefly tried on BiPAP of 14/10 but barely achieved any sleep on BiPAP during the study. I suggested a home treatment pressure of 12 cm. He called in the interim reporting residual increase in events. His AHI was around 15 on a pressure of 12 cm and in mid-May I changed his pressure setting to BiPAP of 14/10. He has had difficulty with mask tolerance  and seal and try different masks in the interim. He does report difficulty getting in touch with his DME company recently.   ?  ?I reviewed his BiPAP compliance data from the last 30 days which is from 02/19/2018 through 03/20/2018, during which time he used his machine every night except for 1, percent used days greater than 4 hours was 97% which is excellent, average usage also excellent at 6 hours and 59 minutes, residual AHI suboptimal at 14.8 per hour  primarily because of obstructive events. Previously on CPAP he did have more central events. Seal is very good, 95th percentile of leak at 2.6 L/m on a pressure of 14/10. Ramp time per wife's report is 5 minutes.  ?  ?11/14/2017: (He) reports snoring and excessive daytime somnolence. He has been witnessed to have witnessed breathing pauses while asleep. He has woken up with a sense of shortness of breath. ?He had extensive stroke workup during his hospitalization from 10/22/2017 through 10/24/2017. He was in an inpatient rehabilitation from 10/24/2017 through 10/31/2017. I reviewed the hospital records. His Epworth sleepiness score is 9 out of 24 today, fatigue score is 12 out of 63.  ?He sleeps on his sides and back. BT is around 11 PM and WT around 7 AM. He is retired from  [employer] field [employer], C[employer]. Quit smoking about 40 years, no EtOH, he does not drink caffeine on a regular basis, no FHx of OSA. He had a TE as a child.  ?He denies AM HAs, has nocturia about 1-2/night on average. He takes a nap in the afternoons sometimes.  ? ? ?His Past Medical History Is Significant For: ?Past Medical History:  ?Diagnosis Date  ? CERUMEN IMPACTION 08/22/2009  ? HYPERLIPIDEMIA 06/06/2009  ? PREDIABETES 07/13/2010  ? Stroke [facility])   ? ? ?His Past Surgical History Is Significant For: ?Past Surgical History:  ?Procedure Laterality Date  ? TONSILLECTOMY    ? ? ?His Family History Is Significant For: ?Family History  ?Problem Relation Age of Onset  ? Stroke Father   ? Heart disease Father   ? Diabetes Father   ?     type ll  ? Alcohol abuse Other   ? Stroke Brother   ? ? ?His Social History Is Significant For: ?Social History  ? ?Socioeconomic History  ? Marital status: Married  ?  Spouse name: Not on file  ? Number of children: Not on file  ? Years of education: Not on file  ? Highest education level: Not on file  ?Occupational History  ? Occupation: Retired  ?Tobacco Use  ? Smoking status: Former  ?  Packs/day: 1.00  ?  Years: 20.00  ?   Pack years: 20.00  ?  Types: Cigarettes  ?  Quit date: 08/11/1983  ?  Years since quitting: 38.4  ? Smokeless tobacco: Never  ?Vaping Use  ? Vaping Use: Never used  ?Substance and Sexual Activity  ? Alcohol [place]

## 2022-01-27 ENCOUNTER — Ambulatory Visit: Payer: Medicare Other | Admitting: Neurology

## 2022-01-28 ENCOUNTER — Ambulatory Visit: Payer: Medicare Other | Admitting: Neurology

## 2022-03-04 DIAGNOSIS — L821 Other seborrheic keratosis: Secondary | ICD-10-CM | POA: Diagnosis not present

## 2022-03-04 DIAGNOSIS — D1801 Hemangioma of skin and subcutaneous tissue: Secondary | ICD-10-CM | POA: Diagnosis not present

## 2022-03-04 DIAGNOSIS — D225 Melanocytic nevi of trunk: Secondary | ICD-10-CM | POA: Diagnosis not present

## 2022-03-04 DIAGNOSIS — Z85828 Personal history of other malignant neoplasm of skin: Secondary | ICD-10-CM | POA: Diagnosis not present

## 2022-03-04 DIAGNOSIS — L812 Freckles: Secondary | ICD-10-CM | POA: Diagnosis not present

## 2022-03-04 DIAGNOSIS — L570 Actinic keratosis: Secondary | ICD-10-CM | POA: Diagnosis not present

## 2022-05-03 ENCOUNTER — Telehealth: Payer: Self-pay | Admitting: Family Medicine

## 2022-05-03 DIAGNOSIS — E785 Hyperlipidemia, unspecified: Secondary | ICD-10-CM

## 2022-05-03 DIAGNOSIS — Z125 Encounter for screening for malignant neoplasm of prostate: Secondary | ICD-10-CM

## 2022-05-03 DIAGNOSIS — I10 Essential (primary) hypertension: Secondary | ICD-10-CM

## 2022-05-03 DIAGNOSIS — E119 Type 2 diabetes mellitus without complications: Secondary | ICD-10-CM

## 2022-05-03 NOTE — Telephone Encounter (Signed)
Pt wife is calling and would like to sch cpe labs in advance on 06-28-2022. Please put order in if ok

## 2022-05-03 NOTE — Telephone Encounter (Signed)
Future lab orders placed

## 2022-05-04 NOTE — Telephone Encounter (Signed)
Left a detailed message informing the patient's wife that labs orders have been placed.

## 2022-05-04 NOTE — Telephone Encounter (Signed)
Pt has been sch for 06-28-2022 740 am

## 2022-05-28 ENCOUNTER — Other Ambulatory Visit: Payer: Self-pay

## 2022-05-28 ENCOUNTER — Encounter (HOSPITAL_COMMUNITY): Payer: Self-pay | Admitting: Emergency Medicine

## 2022-05-28 ENCOUNTER — Emergency Department (HOSPITAL_COMMUNITY)
Admission: EM | Admit: 2022-05-28 | Discharge: 2022-05-28 | Disposition: A | Discharge status: Home / Self Care | Payer: Medicare Other | Attending: Emergency Medicine | Admitting: Emergency Medicine

## 2022-05-28 ENCOUNTER — Emergency Department (HOSPITAL_COMMUNITY): Payer: Medicare Other

## 2022-05-28 DIAGNOSIS — R079 Chest pain, unspecified: Secondary | ICD-10-CM | POA: Diagnosis not present

## 2022-05-28 DIAGNOSIS — I10 Essential (primary) hypertension: Secondary | ICD-10-CM | POA: Insufficient documentation

## 2022-05-28 DIAGNOSIS — Z79899 Other long term (current) drug therapy: Secondary | ICD-10-CM | POA: Insufficient documentation

## 2022-05-28 DIAGNOSIS — R0789 Other chest pain: Secondary | ICD-10-CM | POA: Diagnosis not present

## 2022-05-28 LAB — CBC
HCT: 37.3 % — ABNORMAL LOW (ref 39.0–52.0)
Hemoglobin: 12.6 g/dL — ABNORMAL LOW (ref 13.0–17.0)
MCH: 30.4 pg (ref 26.0–34.0)
MCHC: 33.8 g/dL (ref 30.0–36.0)
MCV: 89.9 fL (ref 80.0–100.0)
Platelets: 380 10*3/uL (ref 150–400)
RBC: 4.15 MIL/uL — ABNORMAL LOW (ref 4.22–5.81)
RDW: 13.4 % (ref 11.5–15.5)
WBC: 8.4 10*3/uL (ref 4.0–10.5)
nRBC: 0 % (ref 0.0–0.2)

## 2022-05-28 LAB — BASIC METABOLIC PANEL
Anion gap: 8 (ref 5–15)
BUN: 20 mg/dL (ref 8–23)
CO2: 28 mmol/L (ref 22–32)
Calcium: 9.5 mg/dL (ref 8.9–10.3)
Chloride: 101 mmol/L (ref 98–111)
Creatinine, Ser: 0.94 mg/dL (ref 0.61–1.24)
GFR, Estimated: >60 mL/min (ref >60)
Glucose, Bld: 124 mg/dL — ABNORMAL HIGH (ref 70–99)
Potassium: 4.4 mmol/L (ref 3.5–5.1)
Sodium: 137 mmol/L (ref 135–145)

## 2022-05-28 LAB — TROPONIN I (HIGH SENSITIVITY)
Troponin I (High Sensitivity): 5 ng/L (ref <18)
Troponin I (High Sensitivity): 5 ng/L (ref <18)

## 2022-05-28 NOTE — ED Triage Notes (Signed)
Patient complains of chest discomfort radiating into his neck that was present on waking this morning. Patient denies shortness of breath, denies dizziness. Patient is alert, oriented, ambulatory, and in no apparent distress at this time.

## 2022-05-28 NOTE — Discharge Instructions (Signed)
1.  At this time your EKG and your heart enzymes do not show signs of a heart attack.  However, if you get any symptoms such as a recurrence of chest pain, shortness of breath, nausea, general weakness, sweatiness or any other concerning changes, return to the emergency department immediately for recheck. 2.  Schedule follow-up with your family doctor for recheck as soon as possible. 3.  May take extra strength Tylenol per package instructions for possible muscular pain and take Pepcid twice daily for possible reflux symptoms.  See if these medications are helpful.

## 2022-05-28 NOTE — ED Notes (Signed)
Pt reports his chest pain has improved. Endorses soreness to his neck. Repeat trop sent.

## 2022-05-28 NOTE — ED Provider Notes (Signed)
[facility] EMERGENCY DEPARTMENT Provider Note   CSN: [id number] Arrival date & time: 05/28/22  2423     History  Chief Complaint  Patient presents with   Chest Pain    James Pugh is a 76 y.o. male.  HPI At baseline, patient does not have any known coronary artery disease.  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  He takes Plavix and Lipitor.  Patient has been feeling well recently.  This morning shortly after he awakened he got a central chest pain that radiated up towards his shoulders.  He reports at onset it was quite painful.  Has been abating since onset.  Patient reports now if he takes a really deep breath he can feel a slight discomfort which normally he would not be able to.  However, he denies any shortness of breath and denies that he felt short of breath with onset of symptoms.  Recent cough fever or chills.  He reports he has been very active this week doing a lot of yard work and outdoor chores.  He has not been experiencing any exertional shortness of breath, lightheadedness or chest pain.  At this point symptoms are essentially resolved.  No history of PE.  Patient has not been having any problems with calf pain or leg swelling.    Home Medications Prior to Admission medications   Medication Sig Start Date End Date Taking? Authorizing Provider  atorvastatin (LIPITOR) 80 MG tablet TAKE 1 TABLET BY MOUTH EVERY DAY 10/16/21   Burchette, Alinda Sierras, MD  clopidogrel (PLAVIX) 75 MG tablet TAKE 1 TABLET BY MOUTH EVERY DAY 10/16/21   Burchette, Alinda Sierras, MD  lisinopril (ZESTRIL) 2.5 MG tablet TAKE 1 TABLET BY MOUTH EVERY DAY 10/16/21   Burchette, Alinda Sierras, MD      Allergies    Patient has no known allergies.    Review of Systems   Review of Systems  Physical Exam Updated Vital Signs BP (!) 161/69 (BP Location: Right Arm)   Pulse (!) 58   Temp 98 F (36.7 C) (Oral)   Resp 15   SpO2 98%  Physical Exam Constitutional:       Comments: Alert.  Normal mental status.  No respiratory distress.  Good physical condition.  HENT:     Head: Normocephalic and atraumatic.     Mouth/Throat:     Pharynx: Oropharynx is clear.  Eyes:     Extraocular Movements: Extraocular movements intact.  Cardiovascular:     Rate and Rhythm: Normal rate and regular rhythm.  Pulmonary:     Effort: Pulmonary effort is normal.     Breath sounds: Normal breath sounds.  Chest:     Chest wall: No tenderness.  Abdominal:     General: There is no distension.     Palpations: Abdomen is soft.     Tenderness: There is no abdominal tenderness. There is no guarding.  Musculoskeletal:        General: No swelling or tenderness. Normal range of motion.     Right lower leg: No edema.     Left lower leg: No edema.  Skin:    General: Skin is warm and dry.  Neurological:     General: No focal deficit present.     Mental Status: He is oriented to person, place, and time.     Motor: No weakness.     Coordination: Coordination normal.  Psychiatric:  Mood and Affect: Mood normal.     ED Results / Procedures / Treatments   Labs (all labs ordered are listed, but only abnormal results are displayed) Labs Reviewed  BASIC METABOLIC PANEL - Abnormal; Notable for the following components:      Result Value   Glucose, Bld 124 (*)    All other components within normal limits  CBC - Abnormal; Notable for the following components:   RBC 4.15 (*)    Hemoglobin 12.6 (*)    HCT 37.3 (*)    All other components within normal limits  TROPONIN I (HIGH SENSITIVITY)  TROPONIN I (HIGH SENSITIVITY)    EKG EKG Interpretation  Date/Time:  Friday May 28 2022 06:54:16 EDT Ventricular Rate:  54 PR Interval:  168 QRS Duration: 88 QT Interval:  414 QTC Calculation: 392 R Axis:   78 Text Interpretation: Sinus bradycardia Otherwise normal ECG When compared with ECG of 25-May-2018 04:21, PREVIOUS ECG IS PRESENT since last tracing no  significant change Confirmed by Malvin Johns [phone]) on 05/28/2022 9:18:12 AM  Radiology DG Chest 2 View  Result Date: 05/28/2022 CLINICAL DATA:  Chest pain EXAM: CHEST - 2 VIEW COMPARISON:  Radiograph 05/24/2018 FINDINGS: Unchanged cardiomediastinal silhouette. There is no new airspace disease. Unchanged right upper and left lower lung opacities since September 2019, likely scarring. Unchanged there is no pleural effusion. No pneumothorax. There is no acute osseous abnormality. Thoracic spondylosis. IMPRESSION: No evidence of acute cardiopulmonary disease. Electronically Signed   By: Maurine Simmering M.D.   On: 05/28/2022 07:53    Procedures Procedures    Medications Ordered in ED Medications - No data to display  ED Course/ Medical Decision Making/ A&P                           Medical Decision Making Amount and/or Complexity of Data Reviewed Labs: ordered. Radiology: ordered.   Patient presents with acute onset of chest pain at rest this morning.  Patient does not have any significant history of coronary artery disease.  He does have risk factors of hypercholesterolemia and hypertension.  At baseline, patient is in good physical condition and active without any symptoms of ischemia.  Normal exercise stress test and echocardiogram 2019.  [place] diagnosis includes ACS\aortic dissection\pulmonary embolus\reflux\musculoskeletal pain.  EKG reviewed without any ischemic appearance.  2 troponins are normal.  Patient is ruled out for acute MI.  Patient is low risk for PE.  No significant risk factors and atypical symptoms.  Patient is not had any dyspnea, hypoxia or tachycardia.  Dissection of the lower probability.  Patient's symptoms have been spontaneously resolving.  Patient has well-controlled hypertension.  He is not significantly hypertensive.  With resolution of symptoms, lack of associated symptoms and controlled hypertension, dissection appears to be of significantly lower  probability.  At this time, patient is stable for discharge.  Vital signs are stable and is resolved.  We have reviewed strict return precautions for any symptoms of recurrence of pain, nausea, lightheadedness, sweating or shortness of breath.  Patient and his wife voiced understanding.  Discussed the fact that atherosclerosis can be developing but not have a heart attack at the time of chest pain.  Plan will be for close follow-up on outpatient basis with immediate return if recurrence of symptoms.  Patient reports he has been doing a lot of physical work over the past week.  Musculoskeletal pain considered.  I have suggested extra strength Tylenol and Pepcid for  the next several days.        Final Clinical Impression(s) / ED Diagnoses Final diagnoses:  Nonspecific chest pain    Rx / DC Orders ED Discharge Orders     None         Charlesetta Shanks, MD 05/28/22 1541

## 2022-06-28 ENCOUNTER — Other Ambulatory Visit (INDEPENDENT_AMBULATORY_CARE_PROVIDER_SITE_OTHER): Payer: Medicare Other

## 2022-06-28 DIAGNOSIS — Z125 Encounter for screening for malignant neoplasm of prostate: Secondary | ICD-10-CM | POA: Diagnosis not present

## 2022-06-28 DIAGNOSIS — E785 Hyperlipidemia, unspecified: Secondary | ICD-10-CM | POA: Diagnosis not present

## 2022-06-28 DIAGNOSIS — I10 Essential (primary) hypertension: Secondary | ICD-10-CM | POA: Diagnosis not present

## 2022-06-28 DIAGNOSIS — E119 Type 2 diabetes mellitus without complications: Secondary | ICD-10-CM | POA: Diagnosis not present

## 2022-06-28 LAB — LIPID PANEL
Cholesterol: 119 mg/dL (ref 0–200)
HDL: 38.6 mg/dL — ABNORMAL LOW (ref >39.00)
LDL Cholesterol: 70 mg/dL (ref 0–99)
NonHDL: 79.93
Total CHOL/HDL Ratio: 3
Triglycerides: 51 mg/dL (ref 0.0–149.0)
VLDL: 10.2 mg/dL (ref 0.0–40.0)

## 2022-06-28 LAB — HEPATIC FUNCTION PANEL
ALT: 20 U/L (ref 0–53)
AST: 19 U/L (ref 0–37)
Albumin: 4.1 g/dL (ref 3.5–5.2)
Alkaline Phosphatase: 47 U/L (ref 39–117)
Bilirubin, Direct: 0.1 mg/dL (ref 0.0–0.3)
Total Bilirubin: 0.6 mg/dL (ref 0.2–1.2)
Total Protein: 6.5 g/dL (ref 6.0–8.3)

## 2022-06-28 LAB — BASIC METABOLIC PANEL
BUN: 14 mg/dL (ref 6–23)
CO2: 30 mEq/L (ref 19–32)
Calcium: 9.5 mg/dL (ref 8.4–10.5)
Chloride: 99 mEq/L (ref 96–112)
Creatinine, Ser: 0.84 mg/dL (ref 0.40–1.50)
GFR: 83.79 mL/min (ref >60.00)
Glucose, Bld: 101 mg/dL — ABNORMAL HIGH (ref 70–99)
Potassium: 4.3 mEq/L (ref 3.5–5.1)
Sodium: 135 mEq/L (ref 135–145)

## 2022-06-28 LAB — CBC WITH DIFFERENTIAL/PLATELET
Basophils Absolute: 0 10*3/uL (ref 0.0–0.1)
Basophils Relative: 0.8 % (ref 0.0–3.0)
Eosinophils Absolute: 0.2 10*3/uL (ref 0.0–0.7)
Eosinophils Relative: 4.8 % (ref 0.0–5.0)
HCT: 37.3 % — ABNORMAL LOW (ref 39.0–52.0)
Hemoglobin: 12.8 g/dL — ABNORMAL LOW (ref 13.0–17.0)
Lymphocytes Relative: 33.8 % (ref 12.0–46.0)
Lymphs Abs: 1.6 10*3/uL (ref 0.7–4.0)
MCHC: 34.2 g/dL (ref 30.0–36.0)
MCV: 88.6 fl (ref 78.0–100.0)
Monocytes Absolute: 0.6 10*3/uL (ref 0.1–1.0)
Monocytes Relative: 11.8 % (ref 3.0–12.0)
Neutro Abs: 2.3 10*3/uL (ref 1.4–7.7)
Neutrophils Relative %: 48.8 % (ref 43.0–77.0)
Platelets: 255 10*3/uL (ref 150.0–400.0)
RBC: 4.2 Mil/uL — ABNORMAL LOW (ref 4.22–5.81)
RDW: 14.1 % (ref 11.5–15.5)
WBC: 4.7 10*3/uL (ref 4.0–10.5)

## 2022-06-28 LAB — PSA, MEDICARE: PSA: 0.48 ng/ml (ref 0.10–4.00)

## 2022-06-30 ENCOUNTER — Encounter: Payer: Self-pay | Admitting: Family Medicine

## 2022-06-30 ENCOUNTER — Ambulatory Visit (INDEPENDENT_AMBULATORY_CARE_PROVIDER_SITE_OTHER): Payer: Medicare Other | Admitting: Family Medicine

## 2022-06-30 VITALS — BP 124/60 | HR 61 | Temp 97.7°F | Ht 66.14 in | Wt 154.8 lb

## 2022-06-30 DIAGNOSIS — G3184 Mild cognitive impairment, so stated: Secondary | ICD-10-CM

## 2022-06-30 DIAGNOSIS — I10 Essential (primary) hypertension: Secondary | ICD-10-CM

## 2022-06-30 DIAGNOSIS — E785 Hyperlipidemia, unspecified: Secondary | ICD-10-CM | POA: Diagnosis not present

## 2022-06-30 DIAGNOSIS — R413 Other amnesia: Secondary | ICD-10-CM | POA: Diagnosis not present

## 2022-06-30 DIAGNOSIS — Z23 Encounter for immunization: Secondary | ICD-10-CM | POA: Diagnosis not present

## 2022-06-30 DIAGNOSIS — E119 Type 2 diabetes mellitus without complications: Secondary | ICD-10-CM

## 2022-06-30 LAB — POCT GLYCOSYLATED HEMOGLOBIN (HGB A1C): Hemoglobin A1C: 6.5 % — AB (ref 4.0–5.6)

## 2022-06-30 MED ORDER — ATORVASTATIN CALCIUM 80 MG PO TABS: 80.0000 mg | ORAL_TABLET | Freq: Every day | ORAL | 3 refills | Status: DC

## 2022-06-30 MED ORDER — CLOPIDOGREL BISULFATE 75 MG PO TABS: 75.0000 mg | ORAL_TABLET | Freq: Every day | ORAL | 3 refills | Status: DC

## 2022-06-30 MED ORDER — LISINOPRIL 2.5 MG PO TABS: 2.5000 mg | ORAL_TABLET | Freq: Every day | ORAL | 3 refills | Status: DC

## 2022-06-30 NOTE — Progress Notes (Signed)
Established Patient Office Visit  Subjective   Patient ID: James Pugh, male    DOB: 1946/01/15  Age: 76 y.o. MRN: [id number]  Chief Complaint  Patient presents with   Annual Exam    HPI   Mr. Campillo is here today for medical follow-up.  He has past history of CVA and also has hypertension, obstructive sleep apnea, type 2 diabetes, dyslipidemia.  Generally doing well.  No recent falls.  No depression issues.  No recent chest pains.  He does have concerns he may have some mild memory impairment with things like names and dates.  He has scheduled follow-up with neuropsychologist soon in November to further assess.  Diabetes has been stable without medication.  No polyuria or polydipsia.  Watches diet fairly closely.  Stays very active with yard work.  He does get yearly eye exam but cannot recall date of last exam  Remains on high-dose statin with 80 mg Lipitor, Plavix 75 mg daily, and lisinopril 2.5 mg daily.  Blood pressure stable.  No recent dizziness.  Past Medical History:  Diagnosis Date   CERUMEN IMPACTION 08/22/2009   HYPERLIPIDEMIA 06/06/2009   PREDIABETES 07/13/2010   Stroke [facility])    Past Surgical History:  Procedure Laterality Date   TONSILLECTOMY      reports that he quit smoking about 38 years ago. His smoking use included cigarettes. He has a 20.00 pack-year smoking history. He has never used smokeless tobacco. He reports that he does not drink alcohol and does not use drugs. family history includes Alcohol abuse in an other family member; Diabetes in his father; Heart disease in his father; Stroke in his brother and father. No Known Allergies  Review of Systems  Constitutional:  Negative for malaise/fatigue.  Eyes:  Negative for blurred vision.  Respiratory:  Negative for shortness of breath.   Cardiovascular:  Negative for chest pain.  Gastrointestinal:  Negative for abdominal pain.  Neurological:  Negative for dizziness, weakness and headaches.   Psychiatric/Behavioral:  Positive for memory loss. Negative for depression.       Objective:     BP 124/60 (BP Location: Left Arm, Patient Position: Sitting, Cuff Size: Normal)   Pulse 61   Temp 97.7 F (36.5 C) (Oral)   Ht 5' 6.14" (1.68 m)   Wt 154 lb 12.8 oz (70.2 kg)   SpO2 98%   BMI 24.88 kg/m    Physical Exam Vitals reviewed.  Constitutional:      Appearance: He is well-developed.  HENT:     Right Ear: External ear normal.     Left Ear: External ear normal.  Eyes:     Pupils: Pupils are equal, round, and reactive to light.  Neck:     Thyroid: No thyromegaly.  Cardiovascular:     Rate and Rhythm: Normal rate and regular rhythm.  Pulmonary:     Effort: Pulmonary effort is normal. No respiratory distress.     Breath sounds: Normal breath sounds. No wheezing or rales.  Abdominal:     Palpations: Abdomen is soft. There is no mass.     Tenderness: There is no abdominal tenderness.  Musculoskeletal:     Cervical back: Neck supple.     Right lower leg: No edema.     Left lower leg: No edema.  Skin:    Comments: Feet reveal no skin lesions. Good distal foot pulses. Good capillary refill. No calluses. Normal sensation with monofilament testing   Neurological:     Mental Status:  He is alert and oriented to person, place, and time.      Results for orders placed or performed in visit on 06/30/22  POCT glycosylated hemoglobin (Hb A1C)  Result Value Ref Range   Hemoglobin A1C 6.5 (A) 4.0 - 5.6 %   HbA1c POC (<> result, manual entry)     HbA1c, POC (prediabetic range)     HbA1c, POC (controlled diabetic range)        The ASCVD Risk score (Arnett DK, et al., 2019) failed to calculate for the following reasons:   The patient has a prior MI or stroke diagnosis    Assessment & Plan:   #1 type 2 diabetes.  Well-controlled with A1c 6.5%.  Patient continues to express desire to control this without medication he has done a great job over the past several years.   Continue low glycemic diet.  Suggested 23-monthfollow-up to recheck A1c.  Continue annual eye exam.  Check urine microalbumin today.  #2 hypertension well-controlled on low-dose lisinopril.  Continue lisinopril 2.5 mg daily.  Refill for 1 year  #3 history of CVA.  Patient on high-dose statin and Plavix.  These were both refilled for 1 year.  Check lipid and hepatic panel  #4 mild memory loss.  Patient has scheduled follow-up with neuropsychologist.  Check TSH and B12.   No follow-ups on file.    BCarolann Littler MD

## 2022-07-21 ENCOUNTER — Encounter: Payer: Medicare Other | Attending: Psychology | Admitting: Psychology

## 2022-07-21 ENCOUNTER — Encounter: Payer: Self-pay | Admitting: Psychology

## 2022-07-21 DIAGNOSIS — R4789 Other speech disturbances: Secondary | ICD-10-CM | POA: Diagnosis not present

## 2022-07-21 DIAGNOSIS — R413 Other amnesia: Secondary | ICD-10-CM | POA: Insufficient documentation

## 2022-07-21 DIAGNOSIS — G4733 Obstructive sleep apnea (adult) (pediatric): Secondary | ICD-10-CM | POA: Diagnosis not present

## 2022-07-21 NOTE — Progress Notes (Signed)
Neuropsychological Consultation   Patient:   James Pugh   DOB:   November 03, 1945  MR Number:  [id number]  Location:  [facility] [place], [place] [id number] [place] [address] Dept: [phone]           Date of Service:   07/21/2022  Start Time:   8 AM End Time:   9 AM  Today's visit was an in person visit dose conducted in my outpatient clinic office.  The patient, his wife and myself were all present for this clinical interview.  1 hour and 15 minutes was spent in face-to-face clinical interview with the other 45 minutes spent with records review, report writing and setting up testing protocols.  Provider/Observer:  Ilean Skill, Psy.D.       Clinical Neuropsychologist       Billing Code/Service: 96116/96121  Chief Complaint:    James Pugh is a 76 year old male referred for neuropsychological evaluation by his treating neurologist Star Age, MD who has been following the patient for obstructive sleep apnea for some time and has a history of a brainstem stroke/pontine stroke in 2019.  The patient has described increasing memory difficulties that have progressed over time and noted by both the patient and his wife.  The patient initially denies any word finding difficulties but more in-depth questioning the patient does acknowledge difficulties coming up with people's names or other targeted naming type of difficulties.  The patient is generally compliant with his BiPAP machine but does acknowledge frustration with using it but knows the importance of using this due to his diagnosis of obstructive sleep apnea and history of previous stroke.  After his stroke, the patient was treated on the inpatient neuro rehab program and made rather rapid significant improvements in symptoms.  I did not see the patient during his rehab work but he was there due to his stroke and  discharged home.  Reason for Service:  James Pugh is a 76 year old male referred for neuropsychological evaluation by his treating neurologist Star Age, MD who has been following the patient for obstructive sleep apnea for some time and has a history of a brainstem stroke/pontine stroke in 2019.  The patient has described increasing memory difficulties that have progressed over time and noted by both the patient and his wife.  The patient initially denies any word finding difficulties but more in-depth questioning the patient does acknowledge difficulties coming up with people's names or other targeted naming type of difficulties.  The patient is generally compliant with his BiPAP machine but does acknowledge frustration with using it but knows the importance of using this due to his diagnosis of obstructive sleep apnea and history of previous stroke.  After his stroke, the patient was treated on the inpatient neuro rehab program and made rather rapid significant improvements in symptoms.  I did not see the patient during his rehab work but he was there due to his stroke and discharged home.  Patient has underlying past medical history including diabetes, right pontine stroke, hyperlipidemia and history of obstructive sleep apnea utilizing BiPAP machine.  Patient's wife has been increasingly concerned about changes in his short-term memory that she describes as progressive.  MRI conducted on 02/11/2021 included the interpretive impressions as showing mild perisylvian atrophy, mild chronic small vessel ischemic disease, and indications of chronic right paramedian pontine ischemic infarct.  During the clinical interview today the patient reports that  he feels like he for started noticing change in his memory around the time of his stroke in 2019 but it seems to have progressively worsened.  He reports that it has been very gradual in time but the patient minimize any difficulties from that.  The patient's  wife reports that she did not notice any particular memory issues before the stroke but has noticed issues with memory that tend to progress.  The patient's wife notes that the patient can get extremely fixated and consumed by various issues such as politics and whether and can spend a great deal of time watching cable news shows reformations around weather.  Patient's wife reports that this will cause him a great deal of emotional distress but he has difficulty distancing himself from the stream of information.  The patient denies any other particular changes in cognitive functioning.  Patient and his wife deny any changes in geographic orientation or visual spatial/visual constructional changes.  The patient has displayed no tremor types of events and denies any visual or auditory hallucinations.  Patient's wife does report that there is more stress and agitation and the patient is constantly consumed and stressed by concerns over the state of our "nation".  No other mood changes noted.  The patient and his wife describe the events around his stroke in 2019.  The patient reports that he was up in their attic working on repairing a ventilation type and was up there for hours.  He reports that he got dizzy and wobbly and weak need and eventually went to bed that night with fatigue.  He woke up with worsening symptoms including motor issues and tactile changes on the left side of his body.  The patient reports that he drove to his daughter's house who then took him to the hospital at [facility] for work-up.  It was at that point they identified the cerebrovascular issue that it occurred hours before.  The patient reports that there is a family history of dementia.  The patient's mother developed significant cognitive changes and cognitive loss starting in her mid 76s and was told at some point that his mother had Alzheimer's.  However, does not appear that any formal assessment was done and she developed a sudden  change in cognition after a UTI infection.  The patient is 1 of 9 children and is the youngest of 9 children.  The patient has 2 older brothers and 1 older sister who developed "dementia" and all of which were in their mid 20s.  There is no history of early onset progressive dementia's in the family.     The patient reports that his sleep is not particularly "great."  And that there are sporadic times of better or worse sleep.  Appetite is described as good.  The patient is generally compliant with his BiPAP machine.  The patient has made significant improvements following his pontine stroke.  Behavioral Observation: SLADE PIERPOINT  presents as a 76 y.o.-year-old Right handed Caucasian Male who appeared his stated age. his dress was Appropriate and he was Well Groomed and his manners were Appropriate to the situation.  his participation was indicative of Appropriate and Attentive behaviors.  There were not physical disabilities noted.  he displayed an appropriate level of cooperation and motivation.     Interactions:    Active Appropriate  Attention:   abnormal and attention span appeared shorter than expected for age  Memory:   abnormal; remote memory intact, recent memory impaired and the patient  relied on his wife to facilitate with some information.  Visuo-spatial:  not examined  Speech (Volume):  normal  Speech:   normal; there were reports of difficulties with remembering people's names or some targeted word finding issues although the patient appeared to have good receptive language abilities and adequate expressive language abilities.  Thought Process:  Coherent and Relevant  Though Content:  WNL; not suicidal and not homicidal  Orientation:   person, place, time/date, and situation  Judgment:   Good  Planning:   Good  Affect:    Appropriate  Mood:    Dysphoric  Insight:   Good  Intelligence:   normal  Marital Status/Living: The patient was born and raised in [place] along with 8 siblings.  Normal childhood illnesses were noted.  The patient currently lives with his wife of 79 years and this is his only marriage.  The patient has 2 adult daughters age 59 and 49 both of whom are in excellent health.  Current Employment: The patient is retired.  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  The patient also served in [place] between 1968 1972 but did not report any active combat duty types of experiences.  However, this was not directly asked about.  The patient's highest rank at discharge was E5 and he was honorably discharged.  Substance Use:  No concerns of substance abuse are reported.    Education:   The patient completed high school and graduated from [employer] and always maintained a very good GPA and graduated with a GPA of 3.8.  His best subject was history and had some relative difficulties with science type classes.  He never repeated any grades and there were no indications of any learning difficulties or disabilities.  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  Psychiatric History:  No prior psychiatric history.  Family Med/Psych History:  Family History  Problem Relation Age of Onset   Stroke Father    Heart disease Father    Diabetes Father        type ll   Alcohol abuse Other    Stroke Brother     Impression/DX:  CAELLUM MANCIL is a 76 year old male referred for neuropsychological evaluation by his treating neurologist Star Age, MD who has been following the patient for obstructive sleep apnea for some time and has a history of a brainstem stroke/pontine stroke in 2019.  The patient  has described increasing memory difficulties that have progressed over time and noted by both the patient and his wife.  The patient initially denies any word finding difficulties but more in-depth questioning the patient does acknowledge difficulties coming up with people's names or other targeted naming type of difficulties.  The patient is generally compliant with his BiPAP machine but does acknowledge frustration with using it but knows the importance of using this due to his diagnosis of obstructive sleep apnea and history of previous stroke.  After his stroke, the patient was treated on the inpatient neuro rehab program and made rather rapid significant improvements in symptoms.  I did not see the patient during his rehab work but he was there due to his stroke and discharged home.  Disposition/Plan:  We have set the patient up for formal neuropsychological assessment and he will start off with a foundational battery related to the Wechsler Adult Intelligence Scale and the Wechsler Memory Scale's as well as the controlled oral Word Association test.  Once these are completed a determination will be made as to any potential need for further testing measures and a formal report will be produced and made available to his referring physician as well as also being made available to the patient and in his EMR.  I  will also sit down with the patient and his wife if he wants and go over the results and talk in greater depth regarding any recommendations going forward.  Diagnosis:    Short-term memory loss  Word finding difficulty  OSA (obstructive sleep apnea)         Electronically Signed   _______________________ Ilean Skill, Psy.D. Clinical Neuropsychologist

## 2022-08-03 DIAGNOSIS — G4733 Obstructive sleep apnea (adult) (pediatric): Secondary | ICD-10-CM | POA: Diagnosis not present

## 2022-08-03 DIAGNOSIS — R413 Other amnesia: Secondary | ICD-10-CM | POA: Diagnosis not present

## 2022-08-03 DIAGNOSIS — R4789 Other speech disturbances: Secondary | ICD-10-CM | POA: Diagnosis not present

## 2022-08-03 NOTE — Progress Notes (Signed)
Behavioral Observations  The patient appeared well-groomed and appropriately dressed. His manners were polite and appropriate to the situation. The patient's attitude toward testing was negative ("I know I failed") but he remained compliant throughout and gave good effort. The patient had significant difficulty understanding instructions and required frequent repetition. Some word finding difficulty was observed (Ex. Used the word "wetter" instead of "better").   Neuropsychology Note  James Pugh completed 120 minutes of neuropsychological testing with technician, Dina Rich, BA, under the supervision of Ilean Skill, PsyD., Clinical Neuropsychologist. The patient did not appear overtly distressed by the testing session, per behavioral observation or via self-report to the technician. Rest breaks were offered.   Clinical Decision Making: In considering the patient's current level of functioning, level of presumed impairment, nature of symptoms, emotional and behavioral responses during clinical interview, level of literacy, and observed level of motivation/effort, a battery of tests was selected by Dr. Sima Matas during initial consultation on 07/21/2022. This was communicated to the technician. Communication between the neuropsychologist and technician was ongoing throughout the testing session and changes were made as deemed necessary based on patient performance on testing, technician observations and additional pertinent factors such as those listed above.  Tests Administered: Controlled Oral Word Association Test (COWAT; FAS & Animals)  Wechsler Adult Intelligence Scale, 4th Edition (WAIS-IV) Wechsler Memory Scale, 4th Edition (WMS-IV); Older Adult Battery   Results:  COWAT FAS Total = 55 Z = 1.10 Animals Total = 15 Z = -0.76       WAIS-IV  Composite Score Summary  Scale Sum of Scaled Scores Composite Score Percentile Rank 95% Conf. Interval Qualitative Description   Verbal Comprehension 35 VCI 108 70 102-113 Average  Perceptual Reasoning 26 PRI 92 30 86-99 Average  Working Memory 16 WMI 89 23 83-96 Low Average  Processing Speed 14 PSI 84 14 77-94 Low Average  Full Scale 91 FSIQ 94 34 90-98 Average  General Ability 61 GAI 101 53 96-106 Average       Verbal Comprehension Subtests Summary  Subtest Raw Score Scaled Score Percentile Rank Reference Group Scaled Score SEM  Similarities 25 11 63 10 1.12  Vocabulary 49 14 91 14 0.73  Information 14 10 50 11 0.73  (Comprehension) 27 13 84 12 1.08        Perceptual Reasoning Subtests Summary  Subtest Raw Score Scaled Score Percentile Rank Reference Group Scaled Score SEM  Block Design 28 10 50 6 1.27  Matrix Reasoning 10 9 37 5 0.73  Visual Puzzles '6 7 16 4 '$ 0.99  (Picture Completion) '6 8 25 5 '$ 1.12        Working Doctor, general practice Raw Score Scaled Score Percentile Rank Reference Group Scaled Score SEM  Digit Span '16 6 9 4 '$ 0.79  Arithmetic 12 10 50 9 0.95        Processing Speed Subtests Summary  Subtest Raw Score Scaled Score Percentile Rank Reference Group Scaled Score SEM  Symbol Search '15 8 25 4 '$ 1.12  Coding '27 6 9 2 '$ 1.12        WMS-IV  Index Score Summary  Index Sum of Scaled Scores Index Score Percentile Rank 95% Confidence Interval Qualitative Descriptor  Auditory Memory (AMI) 22 74 4 69-82 Borderline  Visual Memory (VMI) 6 58 0.3 54-64 Extremely Low  Immediate Memory (IMI) 15 69 2 64-77 Extremely Low  Delayed Memory (DMI) 13 65 1 60-75 Extremely Low       Primary Subtest Scaled Score  Summary  Subtest Domain Raw Score Scaled Score Percentile Rank  Logical Memory I AM '23 8 25  '$ Logical Memory II AM '1 3 1  '$ Verbal Paired Associates I AM '4 3 1  '$ Verbal Paired Associates II AM '4 8 25  '$ Visual Reproduction I VM '15 4 2  '$ Visual Reproduction II VM 0 2 0.4  Symbol Span VWM 14 10 50       Auditory Memory Process Score Summary  Process Score Raw  Score Scaled Score Percentile Rank Cumulative Percentage (Base Rate)  LM II Recognition 15 - - 17-25%  VPA II Recognition 21 - - 3-9%        Visual Memory Process Score Summary  Process Score Raw Score Scaled Score Percentile Rank Cumulative Percentage (Base Rate)  VR II Recognition 3 - - 26-50%       ABILITY-MEMORY ANALYSIS  Ability Score:  VCI: 108 Date of Testing:  WAIS-IV; WMS-IV 2022/08/03  Predicted Difference Method   Index Predicted WMS-IV Index Score Actual WMS-IV Index Score Difference Critical Value  Significant Difference Y/N Base Rate  Auditory Memory 104 74 30 9.39 Y 1-2%  Visual Memory 104 58 46 8.28 Y <1%  Immediate Memory 105 69 36 10.49 Y <1%  Delayed Memory 104 65 39 12.08 Y <1%  Statistical significance (critical value) at the .01 level.        Feedback to Patient: James Pugh will return on 12/01/2022 for an interactive feedback session with Dr. Sima Matas at which time his test performances, clinical impressions and treatment recommendations will be reviewed in detail. The patient understands he can contact our office should he require our assistance before this time.  120 minutes spent face-to-face with patient administering standardized tests, 30 minutes spent scoring Environmental education officer). [CPT [id number], [id number]]  Full report to follow.

## 2022-08-30 DIAGNOSIS — L821 Other seborrheic keratosis: Secondary | ICD-10-CM | POA: Diagnosis not present

## 2022-08-30 DIAGNOSIS — D1801 Hemangioma of skin and subcutaneous tissue: Secondary | ICD-10-CM | POA: Diagnosis not present

## 2022-08-30 DIAGNOSIS — L812 Freckles: Secondary | ICD-10-CM | POA: Diagnosis not present

## 2022-08-30 DIAGNOSIS — Z85828 Personal history of other malignant neoplasm of skin: Secondary | ICD-10-CM | POA: Diagnosis not present

## 2022-08-30 DIAGNOSIS — L570 Actinic keratosis: Secondary | ICD-10-CM | POA: Diagnosis not present

## 2022-08-30 DIAGNOSIS — L853 Xerosis cutis: Secondary | ICD-10-CM | POA: Diagnosis not present

## 2022-09-01 DIAGNOSIS — H5203 Hypermetropia, bilateral: Secondary | ICD-10-CM | POA: Diagnosis not present

## 2022-09-01 DIAGNOSIS — Z961 Presence of intraocular lens: Secondary | ICD-10-CM | POA: Diagnosis not present

## 2022-09-01 DIAGNOSIS — H53001 Unspecified amblyopia, right eye: Secondary | ICD-10-CM | POA: Diagnosis not present

## 2022-09-02 ENCOUNTER — Encounter: Payer: Medicare Other | Attending: Psychology | Admitting: Psychology

## 2022-09-02 ENCOUNTER — Encounter: Payer: Self-pay | Admitting: Psychology

## 2022-09-02 DIAGNOSIS — G4733 Obstructive sleep apnea (adult) (pediatric): Secondary | ICD-10-CM | POA: Insufficient documentation

## 2022-09-02 DIAGNOSIS — I6350 Cerebral infarction due to unspecified occlusion or stenosis of unspecified cerebral artery: Secondary | ICD-10-CM | POA: Diagnosis not present

## 2022-09-02 DIAGNOSIS — R4189 Other symptoms and signs involving cognitive functions and awareness: Secondary | ICD-10-CM | POA: Diagnosis not present

## 2022-09-02 DIAGNOSIS — R4789 Other speech disturbances: Secondary | ICD-10-CM | POA: Diagnosis not present

## 2022-09-02 DIAGNOSIS — R413 Other amnesia: Secondary | ICD-10-CM

## 2022-09-02 NOTE — Progress Notes (Signed)
Neuropsychological Evaluation   Patient:  James Pugh   DOB: 04-May-1946  MR Number: [id number]  Location: [facility] [place], [id number] [place] [address] Dept: [phone]  Start: 4 PM End: 5 PM  Provider/Observer:     Edgardo Roys PsyD  Chief Complaint:      Chief Complaint  Patient presents with   Memory Loss   Cerebrovascular Accident    Reason For Service:      James Pugh is a 76 year old male referred for neuropsychological evaluation by his treating neurologist James Age, MD, who has been following the patient for obstructive sleep apnea for some time and has a history of a brainstem stroke/pontine stroke in 2019.  The patient has described increasing memory difficulties that have progressed over time and noted by both the patient and his wife.  The patient initially denies any word finding difficulties but more in-depth questioning the patient does acknowledge difficulties coming up with people's names or other targeted naming type of difficulties.  The patient is generally compliant with his BiPAP machine but does acknowledge frustration with using it but knows the importance of using this due to his diagnosis of obstructive sleep apnea and history of previous stroke.  After his stroke, the patient was treated on the inpatient neuro rehab program and made rather rapid significant improvements in symptoms.  I did not see the patient during his rehab work but he was there due to his stroke and discharged home.   Patient has underlying past medical history including diabetes, right pontine stroke, hyperlipidemia and history of obstructive sleep apnea utilizing BiPAP machine.  Patient's wife has been increasingly concerned about changes in his short-term memory that she describes as progressive.  MRI conducted on 02/11/2021 included the interpretive  impressions as showing mild perisylvian atrophy, mild chronic small vessel ischemic disease, and indications of chronic right paramedian pontine ischemic infarct.   During the clinical interview today the patient reports that he feels like he first started noticing change in his memory around the time of his stroke in 2019 but it seems to have progressively worsened.  He reports that it has been very gradual in time but the patient minimize any difficulties from that.  The patient's wife reports that she did not notice any particular memory issues before the stroke but has noticed issues with memory that tend to progress.  The patient's wife notes that the patient can get extremely fixated and consumed by various issues such as politics and weather and can spend a great deal of time watching cable news shows or information around weather.  Patient's wife reports that this will cause him a great deal of emotional distress but he has difficulty distancing himself from the stream of information.   The patient denies any other particular changes in cognitive functioning.  Patient and his wife deny any changes in geographic orientation or visual spatial/visual constructional changes.  The patient has displayed no tremor types of symptoms and denies any visual or auditory hallucinations.  Patient's wife reports that there is more stress and agitation and the patient is constantly consumed and stressed by concerns over the state of our "nation".  No other mood changes noted.   The patient and his wife describe the events around his stroke in 2019.  The patient reports that he was up in their attic working on repairing a ventilation pipe and was  up there for hours.  He reports that he got dizzy and wobbly and weak kneed and eventually went to bed that night with fatigue.  He woke up with worsening symptoms including motor issues and tactile changes on the left side of his body.  The patient reports that he drove to  his daughter's house who then took him to the hospital at [facility] for work-up.  It was at that point they identified the cerebrovascular issue that had occurred hours before.   The patient reports that there is a family history of dementia.  The patient's mother developed significant cognitive changes and cognitive loss starting in her mid 52s and was told at some point that his mother had Alzheimer's.  However, there does not appear that any formal assessment was done and she developed a sudden change in cognition after a UTI infection.  The patient is 1 of 9 children and is the youngest of 9 children.  The patient has 2 older brothers and 1 older sister who developed "dementia" and all of which were in their mid 15s.  There is no history of early onset progressive dementia's in the family.       The patient reports that his sleep is not particularly "great."  And that there are sporadic times of better or worse sleep.  Appetite is described as good.  The patient is generally compliant with his BiPAP machine.  The patient has made significant improvements following his pontine stroke.  Tests Administered: Controlled Oral Word Association Test (COWAT; FAS & Animals)  Wechsler Adult Intelligence Scale, 4th Edition (WAIS-IV) Wechsler Memory Scale, 4th Edition (WMS-IV); Older Adult Battery  Participation Level:   Active  Participation Quality:  Redirectable      Behavioral Observation:  The patient appeared well-groomed and appropriately dressed. His manners were polite and appropriate to the situation. The patient's attitude toward testing was negative ("I know I failed") but he remained compliant throughout and gave good effort. The patient had significant difficulty understanding instructions and required frequent repetition. Some word finding difficulty was observed (Ex. Used the word "wetter" instead of "better").   Well Groomed, Alert, and Appropriate.   Test Results:   The patient appeared to  try his hardest throughout formal testing procedures but did have to have instructions repeated due to difficulty initially understanding the process.  Some word finding difficulties were noted at times.  Patient tended to be negative towards himself and his performance but with that said this does appear to be a fair and valid assessment of his current cognitive abilities.  An initial assessment was made to predict the patient's premorbid intellectual and cognitive functioning.  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  Patient did well and history but had some relative difficulties with science type classes.  No indications of any learning disabilities or difficulties were noted.  It is estimated conservatively that the patient has performed historically/premorbidly in the high average range and we will utilize a global composite estimate of performance around the 75th percentile and high average range of performance for comparison purposes.  COWAT FAS Total = 55 Z = 1.10 Animals Total = 15 Z = -0.76   Initially, the patient was administered the control or word association test to obtain an objective assessment of both semantic and lexical fluency.  On the FAS test which assesses lexical fluency the patient performed quite well performing more than 1 standard deviation and normative comparison  group for Pugh, gender and education matched comparisons.  The patient did not do his well on semantic fluency measures where he performed 0.76 standard deviations below normative comparisons.  Combining those 2 suggest significant differences between lexical fluency and semantic fluency.      WAIS-IV             Composite Score Summary         Scale Sum of Scaled Scores Composite Score Percentile Rank 95% Conf. Interval Qualitative Description  Verbal Comprehension 35 VCI 108 70 102-113 Average  Perceptual Reasoning 26 PRI 92 30 86-99 Average   Working Memory 16 WMI 89 23 83-96 Low Average  Processing Speed 14 PSI 84 14 77-94 Low Average  Full Scale 91 FSIQ 94 34 90-98 Average  General Ability 61 GAI 101 53 96-106 Average      The patient was administered the Wechsler Adult Intelligence Scale-IV to provide an objective well normed assessment of a wide range of various cognitive domains.  2 global composite scores were calculated.  Caution should be made interpreting the scores to represent lifelong/premorbid intellectual and cognitive abilities as the patient has had cerebrovascular accident and both the patient and wife are describing changes in overall cognitive function and memory function.  The patient produced a full-scale IQ score of 94 which falls at the 34th percentile and in the average range.  This is roughly 1 standard deviation below predicted levels of premorbid functioning.  We also calculated the patient's general abilities index score which fell at 101 and at the 53rd percentile and in the average range relative to a normative population.  These global composite scores suggest that 1 or more cognitive domains are likely significantly below premorbid functioning levels.             Verbal Comprehension Subtests Summary     Subtest Raw Score Scaled Score Percentile Rank Reference Group Scaled Score SEM  Similarities 25 11 63 10 1.12  Vocabulary 49 14 91 14 0.73  Information 14 10 50 11 0.73  (Comprehension) 27 13 84 12 1.08      The patient produced a verbal comprehension index score of 108 which falls at the 70th percentile and in the upper end of the average range.  This is closer to predicted levels of premorbid functioning.  There was some degree of variability within subtest performance.  The patient's lowest score, although still in the average range relative to a normative population had to do with his general fund of information and retrieval of information that was likely long standing knowledge.  This would be  consistent with some of the noted differences between lexical fluency and semantic fluency.  The patient performed at the 63rd percentile and measures of verbal reasoning and problem-solving but did very well on measures of his general vocabulary knowledge as well as his social judgment and comprehension capacity studies.               Perceptual Reasoning Subtests Summary     Subtest Raw Score Scaled Score Percentile Rank Reference Group Scaled Score SEM  Block Design 28 10 50 6 1.27  Matrix Reasoning 10 9 37 5 0.73  Visual Puzzles '6 7 16 4 '$ 0.99  (Picture Completion) '6 8 25 5 '$ 1.12      The patient produced a perceptual reasoning index score of 92 which falls at the 30th percentile and is greater than 1 standard deviation below predicted levels of premorbid functioning.  The patient  was in the average to low average range on measures of visual analysis and organization, visual spatial and visual processing abilities, visual reasoning and problem-solving and his ability/capacity to identify visual anomalies within a visual gestalt.  He did have some mild difficulties with visual estimation and prediction capacity.  However, overall there were no significant deficits noticed the weaknesses relative to predicted levels of premorbid functioning generally felt in the reason and problem-solving aspect rather than visual spatial or visual constructional deficits.               Working Electrical engineer Raw Score Scaled Score Percentile Rank Reference Group Scaled Score SEM  Digit Span '16 6 9 4 '$ 0.79  Arithmetic 12 10 50 9 0.95      The patient produced a working memory index score of 89 which falls at the 23rd percentile and is in the low average range relative to a normative population.  This is significantly below predicted levels of premorbid functioning.  Most of these weaknesses had to do with difficulties with primary encoding capacity for auditory information.  He did better  on items and capacity where he was required to actively process information in his auditory register.  This pattern suggest that while he has difficulty with encoding auditory information what can be encoded is available to process and interpret.         Processing Speed Subtests Summary      Subtest Raw Score Scaled Score Percentile Rank Reference Group Scaled Score SEM  Symbol Search '15 8 25 4 '$ 1.12  Coding '27 6 9 2 '$ 1.12      The patient produced a processing speed index score of 84 which falls at the 14th percentile and is in the low average range of functioning relative to a normative population.  2 separate measures of visual scanning and visual searching and overall speed of mental operations were well below predicted levels of premorbid functioning.  These were in fact, his lowest areas of performance relative to a normative population and roughly 2 standard deviations below predicted levels of premorbid functioning.  WMS-IV           Index Score Summary       Index Sum of Scaled Scores Index Score Percentile Rank 95% Confidence Interval Qualitative Descriptor  Auditory Memory (AMI) 22 74 4 69-82 Borderline  Visual Memory (VMI) 6 58 0.3 54-64 Extremely Low  Immediate Memory (IMI) 15 69 2 64-77 Extremely Low  Delayed Memory (DMI) 13 65 1 60-75 Extremely Low      The patient was then administered the Wechsler Memory Scale-IV to provide a structured organized assessment of various learning and memory domains.  On the Wechsler Adult Intelligence Scale, the patient showed some mild to moderate weaknesses with regard to primary auditory encoding capacity.  This level of auditory encoding weaknesses would have some deleterious impact upon his capacity to store and organize new information in the auditory domain.  The patient did better on primary visual encoding measures versus auditory encoding measures is indicated by his symbol span subtest of the [place].  On this measure he  performed at the 50th percentile for visual encoding capacity.  Raking memory functions down between auditory versus visual memory components the patient produced an auditory memory index score of 74 which falls at the 4th percentile in the borderline range relative to a normative population.  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  The patient showed even greater deficits with regard to visual memory where he produced a visual memory index score of 58 falling below the 1st percentile in the extremely low range relative to a normative population.  While the patient's weakness and difficulties with primary auditory encoding could explain some of the weakness on his auditory memory index the patient actually performed at the 50th percentile on measures of primary visual encoding.  This pattern would suggest significant difficulty organizing and storing newly learned information with both auditory and visual learning components with greatest deficits for visual memory and learning.  Breaking memory functions down between immediate versus delayed the patient produced an immediate memory index score of 69 which falls at the 2nd percentile relative to a normative population.  Delayed memory index score was 65 falling at the 1st percentile and also in the extremely low range relative to a normative population.  The patient did show some mild improvements under recognition/cued recall types of format but these performances were still below expectations under cued recall.  The patient's pattern with similar immediate versus delayed recall would suggest that the primary memory deficits have to do with the transfer of information from his active auditory and visual register into short-term memory components rather than a loss of  information over period of delay.  This pattern would suggest a primary subcortical involvement in his memory deficits rather than a cortical involvement.  This pattern would not be consistent with any significant lateralization of findings and they were consistent in general between auditory and visual memory deficits with visual greater than auditory deficits.            Primary Subtest Scaled Score Summary     Subtest Domain Raw Score Scaled Score Percentile Rank  Logical Memory I AM '23 8 25  '$ Logical Memory II AM '1 3 1  '$ Verbal Paired Associates I AM '4 3 1  '$ Verbal Paired Associates II AM '4 8 25  '$ Visual Reproduction I VM '15 4 2  '$ Visual Reproduction II VM 0 2 0.4  Symbol Span VWM 14 10 50             Auditory Memory Process Score Summary      Process Score Raw Score Scaled Score Percentile Rank Cumulative Percentage (Base Rate)  LM II Recognition 15 - - 17-25%  VPA II Recognition 21 - - 3-9%              Visual Memory Process Score Summary      Process Score Raw Score Scaled Score Percentile Rank Cumulative Percentage (Base Rate)  VR II Recognition 3 - - 26-50%       ABILITY-MEMORY ANALYSIS   Ability Score:    VCI: 108 Date of Testing:           WAIS-IV; WMS-IV 2022/08/03             Predicted Difference Method    Index Predicted WMS-IV Index Score Actual WMS-IV Index Score Difference Critical Value   Significant Difference Y/N Base Rate  Auditory Memory 104 74 30 9.39 Y 1-2%  Visual Memory 104 58 46 8.28 Y <1%  Immediate Memory 105 69 36 10.49 Y <1%  Delayed Memory 104 65 39 12.08 Y <1%  Statistical significance (critical value) at the .01 level.  Summary of Results:   Overall the results of the current objective neuropsychological assessment are consistent between the patient's subjective reports and objective findings.  The patient does show some changes in weakness with regard to expressive language function.  His primary weakness had to do with semantic  fluency and targeted naming with well-maintained lexical fluency.  The patient also appears to be maintaining his primary knowledge about issues such as social judgment components, vocabulary knowledge and verbal reasoning and problem-solving capacity.  The patient did display some difficulties retrieving well rehearsed long-term general information, which is a pattern consistent with the changes primarily in lexical fluency.  The patient displayed no significant indications of deficits around visual spatial and visual constructional components and his primary weakness visually had to do with certain aspects of visual reasoning and problem-solving rather than visual spatial deficits.  There were significant weaknesses with regard to information processing speed and overall speed of mental operations.  Mild weaknesses with regard to primary auditory encoding were noted but the patient was able to actively process the information that was available from primary encoding capacity.  Visual encoding appeared to be within normal limits.  By far the most significant deficit had to do with primary memory deficits that were identified both for auditory and visual memory learning with visual memory having greater impairments versus auditory memory.  These memory deficits could not be explained by primary encoding deficits.  The patient showed no significant difference in function between immediate versus delayed and the overall pattern of memory deficits would be consistent with a primary weakness initially transferring information from auditory or visual register into immediate short-term memory stores with visual greater than auditory but both of them significantly below premorbid functioning.  The patient showed some limited improvement under recognition/cued recall formats but the primary deficit had to do with transferring information in a way that allowed for her to be effectively stored and  organized.  Impression/Diagnosis:   Overall the results of the current neuropsychological evaluation do show consistency between subjective reports of changes in memory and learning and deficits with regard to targeted naming but no significant changes in lexical fluency.  The patient has a past medical history of subcortical stroke with pontine ischemic/infarct consistent with past medical history.  Previous brain studies have suggested some degree of subcortical foci of chronic small vessel ischemic disease although this was not severe or significant relative to Pugh.  The patient is showing an overall decrease in global cognitive functioning with the greatest impact having to do with learning and memory both auditory and visual with visual greater than auditory deficits, changes in visual and auditory reasoning and problem-solving from premorbid estimates but only mild changes related to visual spatial visual constructional capacity.  There is no indication of lateralization of findings relative to cognitive domains outside of motor functioning.  As far as diagnostic considerations the most likely culprit for these changes have to do with microvascular ischemic change/small vessel disease impacting subcortical brain regions.  The type of memory deficits were particularly loaded on memory components for hippocampal brain regions but there were some indications of changes in cortical function as well.  As this is the first objective assessment of this wide range of cognitive domains the patient would meet a diagnostic criterion for moderate cognitive impairment that appears to be somewhat progressive in nature.  I suspect this is likely vascular in nature related to small vessel disease but there are some changes consistent with cortical brain regions  as well and at this point we cannot completely rule out the possibility of an underlying cortical dementia such as Alzheimer's dementia.  The patient does not show  patterns consistent with Lewy body dementia.  It will be imperative that the patient continue to follow medical advice around cerebrovascular health and maintaining diligent efforts around his significant obstructive sleep apnea and hypertensive state as does both significantly increased risk of further stroke.  We will need to retest the patient in approximately 9 to 12 months to get a more definitive question answered regarding any cortical changes consistent with an Alzheimer's type condition but at this point the most likely culprit is vascular in nature.  I will sit down with the patient and his wife and go over the results of the current neuropsychological evaluation.  At this point, the patient is competent and maintains reasoning and problem-solving capacity to continue to engage in a full range of life activities.  Visual spatial visual constructional abilities appear to be generally intact although changing.  We will need to monitor the patient going forward but he is able to complete ADLs independently.  There will need to likely need to be some behavioral changes as he engages in day-to-day activities that tend to heighten his stress and frustration which then have a negative impact with his relationships and quality of life.  I would stress for the patient to engage in some more positive activities and avoid/curtail his obsessional type viewing of cable news and whether events as they clearly cause stress and become obsessional to some degree.   Diagnosis:    Moderate cognitive impairment  Right pontine cerebrovascular accident ([place])  Short-term memory loss  Word finding difficulty  OSA (obstructive sleep apnea)   _____________________ Ilean Skill, Psy.D. Clinical Neuropsychologist

## 2022-09-16 ENCOUNTER — Ambulatory Visit: Payer: Medicare Other | Admitting: Psychology

## 2022-11-25 ENCOUNTER — Ambulatory Visit: Payer: Medicare Other | Admitting: Psychology

## 2022-11-26 ENCOUNTER — Telehealth: Payer: Self-pay | Admitting: Family Medicine

## 2022-11-26 NOTE — Telephone Encounter (Signed)
Contacted [name] to schedule their annual wellness visit. Call back at later date: 12/02/22  [employer] [facility] direct phone # [phone]  Spoke to spouse   they are out of town and wanted a call back 3/21 or later to schedule

## 2022-12-01 ENCOUNTER — Encounter: Payer: Medicare Other | Attending: Psychology | Admitting: Psychology

## 2022-12-01 DIAGNOSIS — R413 Other amnesia: Secondary | ICD-10-CM | POA: Diagnosis not present

## 2022-12-01 DIAGNOSIS — G4733 Obstructive sleep apnea (adult) (pediatric): Secondary | ICD-10-CM | POA: Insufficient documentation

## 2022-12-01 DIAGNOSIS — R4189 Other symptoms and signs involving cognitive functions and awareness: Secondary | ICD-10-CM | POA: Insufficient documentation

## 2022-12-01 DIAGNOSIS — R4789 Other speech disturbances: Secondary | ICD-10-CM | POA: Insufficient documentation

## 2022-12-01 DIAGNOSIS — I6350 Cerebral infarction due to unspecified occlusion or stenosis of unspecified cerebral artery: Secondary | ICD-10-CM | POA: Insufficient documentation

## 2022-12-09 ENCOUNTER — Encounter: Payer: Self-pay | Admitting: Psychology

## 2022-12-09 NOTE — Progress Notes (Signed)
Neuropsychological Evaluation   Patient:  James Pugh   DOB: 02-07-1946  MR Number: [id number]  Location: [facility] FOR PAIN AND [facility] MEDICINE [facility] PHYSICAL MEDICINE & REHABILITATION [place], [place] [id number] [place] [address] Dept: [phone]  Start: 9 AM End: 10 AM  Provider/Observer:     Edgardo Roys PsyD  Chief Complaint:      Chief Complaint  Patient presents with   Memory Loss   Cerebrovascular Accident   12/01/2022: Today I provided feedback regarding the results of the recent neuropsychological evaluation.  The complete evaluation can be found in the patient's EMR dated 09/02/2022 in the background clinical history and initial clinical evaluation can be found in his EMR dated 07/21/2022.  We went over the results of the recent neuropsychological evaluation.  Below I have included the reason for service for the evaluation as well as the summary/impressions from the formal neuropsychological evaluation.  We reviewed the implications and plans going forward with the patient and wife.   Reason For Service:      ROSCOE MILETO is a 77 year old male referred for neuropsychological evaluation by his treating neurologist Star Age, MD, who has been following the patient for obstructive sleep apnea for some time and has a history of a brainstem stroke/pontine stroke in 2019.  The patient has described increasing memory difficulties that have progressed over time and noted by both the patient and his wife.  The patient initially denies any word finding difficulties but more in-depth questioning the patient does acknowledge difficulties coming up with people's names or other targeted naming type of difficulties.  The patient is generally compliant with his BiPAP machine but does acknowledge frustration with using it but knows the importance of using this due to his diagnosis of obstructive sleep apnea and history of previous stroke.   After his stroke, the patient was treated on the inpatient neuro rehab program and made rather rapid significant improvements in symptoms.  I did not see the patient during his rehab work but he was there due to his stroke and discharged home.   Patient has underlying past medical history including diabetes, right pontine stroke, hyperlipidemia and history of obstructive sleep apnea utilizing BiPAP machine.  Patient's wife has been increasingly concerned about changes in his short-term memory that she describes as progressive.  MRI conducted on 02/11/2021 included the interpretive impressions as showing mild perisylvian atrophy, mild chronic small vessel ischemic disease, and indications of chronic right paramedian pontine ischemic infarct.   During the clinical interview today the patient reports that he feels like he first started noticing change in his memory around the time of his stroke in 2019 but it seems to have progressively worsened.  He reports that it has been very gradual in time but the patient minimize any difficulties from that.  The patient's wife reports that she did not notice any particular memory issues before the stroke but has noticed issues with memory that tend to progress.  The patient's wife notes that the patient can get extremely fixated and consumed by various issues such as politics and weather and can spend a great deal of time watching cable news shows or information around weather.  Patient's wife reports that this will cause him a great deal of emotional distress but he has difficulty distancing himself from the stream of information.   The patient denies any other particular changes in cognitive functioning.  Patient and his wife deny any changes in  geographic orientation or visual spatial/visual constructional changes.  The patient has displayed no tremor types of symptoms and denies any visual or auditory hallucinations.  Patient's wife reports that there is more stress and  agitation and the patient is constantly consumed and stressed by concerns over the state of our "nation".  No other mood changes noted.   The patient and his wife describe the events around his stroke in 2019.  The patient reports that he was up in their attic working on repairing a ventilation pipe and was up there for hours.  He reports that he got dizzy and wobbly and weak kneed and eventually went to bed that night with fatigue.  He woke up with worsening symptoms including motor issues and tactile changes on the left side of his body.  The patient reports that he drove to his daughter's house who then took him to the hospital at [facility] for work-up.  It was at that point they identified the cerebrovascular issue that had occurred hours before.   The patient reports that there is a family history of dementia.  The patient's mother developed significant cognitive changes and cognitive loss starting in her mid 19s and was told at some point that his mother had Alzheimer's.  However, there does not appear that any formal assessment was done and she developed a sudden change in cognition after a UTI infection.  The patient is 1 of 9 children and is the youngest of 9 children.  The patient has 2 older brothers and 1 older sister who developed "dementia" and all of which were in their mid 87s.  There is no history of early onset progressive dementia's in the family.       The patient reports that his sleep is not particularly "great."  And that there are sporadic times of better or worse sleep.  Appetite is described as good.  The patient is generally compliant with his BiPAP machine.  The patient has made significant improvements following his pontine stroke.    Impression/Diagnosis:   Overall the results of the current neuropsychological evaluation do show consistency between subjective reports of changes in memory and learning and deficits with regard to targeted naming but no significant changes in lexical  fluency.  The patient has a past medical history of subcortical stroke with pontine ischemic/infarct consistent with past medical history.  Previous brain studies have suggested some degree of subcortical foci of chronic small vessel ischemic disease although this was not severe or significant relative to age.  The patient is showing an overall decrease in global cognitive functioning with the greatest impact having to do with learning and memory both auditory and visual with visual greater than auditory deficits, changes in visual and auditory reasoning and problem-solving from premorbid estimates but only mild changes related to visual spatial visual constructional capacity.  There is no indication of lateralization of findings relative to cognitive domains outside of motor functioning.  As far as diagnostic considerations the most likely culprit for these changes have to do with microvascular ischemic change/small vessel disease impacting subcortical brain regions.  The type of memory deficits were particularly loaded on memory components for hippocampal brain regions but there were some indications of changes in cortical function as well.  As this is the first objective assessment of this wide range of cognitive domains the patient would meet a diagnostic criterion for moderate cognitive impairment that appears to be somewhat progressive in nature.  I suspect this is likely vascular in  nature related to small vessel disease but there are some changes consistent with cortical brain regions as well and at this point we cannot completely rule out the possibility of an underlying cortical dementia such as Alzheimer's dementia.  The patient does not show patterns consistent with Lewy body dementia.  It will be imperative that the patient continue to follow medical advice around cerebrovascular health and maintaining diligent efforts around his significant obstructive sleep apnea and hypertensive state as does both  significantly increased risk of further stroke.  We will need to retest the patient in approximately 9 to 12 months to get a more definitive question answered regarding any cortical changes consistent with an Alzheimer's type condition but at this point the most likely culprit is vascular in nature.  I will sit down with the patient and his wife and go over the results of the current neuropsychological evaluation.  At this point, the patient is competent and maintains reasoning and problem-solving capacity to continue to engage in a full range of life activities.  Visual spatial visual constructional abilities appear to be generally intact although changing.  We will need to monitor the patient going forward but he is able to complete ADLs independently.  There will need to likely need to be some behavioral changes as he engages in day-to-day activities that tend to heighten his stress and frustration which then have a negative impact with his relationships and quality of life.  I would stress for the patient to engage in some more positive activities and avoid/curtail his obsessional type viewing of cable news and whether events as they clearly cause stress and become obsessional to some degree.   Diagnosis:    Moderate cognitive impairment  Right pontine cerebrovascular accident ([place])  Short-term memory loss  Word finding difficulty  OSA (obstructive sleep apnea)   _____________________ Ilean Skill, Psy.D. Clinical Neuropsychologist

## 2022-12-28 DIAGNOSIS — L570 Actinic keratosis: Secondary | ICD-10-CM | POA: Diagnosis not present

## 2022-12-28 DIAGNOSIS — Z85828 Personal history of other malignant neoplasm of skin: Secondary | ICD-10-CM | POA: Diagnosis not present

## 2023-01-12 ENCOUNTER — Telehealth: Payer: Self-pay | Admitting: Family Medicine

## 2023-01-12 NOTE — Telephone Encounter (Signed)
Contacted James Pugh to schedule their annual wellness visit. Call back at later date: 03/01/23 or after  [name] AWV direct phone # [phone]   Spoke with patient spouse  she stated they are out of town and would like a call back after 03/01/23

## 2023-01-27 ENCOUNTER — Encounter: Payer: Self-pay | Admitting: Neurology

## 2023-01-27 ENCOUNTER — Ambulatory Visit: Payer: Medicare Other | Admitting: Adult Health

## 2023-01-27 ENCOUNTER — Ambulatory Visit (INDEPENDENT_AMBULATORY_CARE_PROVIDER_SITE_OTHER): Payer: Medicare Other | Admitting: Neurology

## 2023-01-27 VITALS — BP 125/67 | HR 54 | Ht 66.0 in | Wt 158.4 lb

## 2023-01-27 DIAGNOSIS — R4189 Other symptoms and signs involving cognitive functions and awareness: Secondary | ICD-10-CM | POA: Diagnosis not present

## 2023-01-27 DIAGNOSIS — G4733 Obstructive sleep apnea (adult) (pediatric): Secondary | ICD-10-CM

## 2023-01-27 NOTE — Patient Instructions (Signed)
I will order a home sleep test for re-evaluation of your sleep apnea, as testing was in April 2019. The home sleep test is done (HST) to re-establish/confirm the sleep apnea diagnosis and to qualify you for a new machine through your insurance.  Sometimes it is difficult to get a BiPAP machine approved but we will surely try as you have done well with your BiPAP machine.  Our sleep lab staff will reach out to you to arrange for pickup and for tutorial of the test equipment. I will write for a new machine after the HST confirms the OSA (obstructive sleep apnea) diagnosis. Please remember that you do not use your BiPAP machine the night of testing, so we get diagnostic data, not treatment data. We will schedule a follow-up appointment after the new machine set-up date, typically within 61 to 89 days post treatment start.  You will need to show ongoing good compliance with usage and fulfill a minimum usage percentage.

## 2023-01-27 NOTE — Progress Notes (Signed)
New, Doristine Mango, RN; Penni Homans; Marveen Reeks; Santina Evans Received, Thank you!     Previous Messages    ----- Message ----- From: Guy Begin, RN Sent: 01/27/2023   9:47 AM EDT To: Henderson Newcomer; Kathyrn Sheriff; Santina Evans; * Subject: new order for pap supplies                    New order in EPIC  Iyad H. Ellett Male, 77 y.o., [birth year] MRN: [id number]   Delmer Islam

## 2023-01-27 NOTE — Progress Notes (Signed)
Subjective:    Patient ID: James Pugh is a 77 y.o. male.  HPI    Interim history:   [name] is a 77 year old right-handed gentleman with an underlying medical history of diabetes, R pontine stroke, hyperlipidemia, and overweight state, who presents for follow-up consultation of his obstructive sleep apnea, on treatment with BiPAP. The patient is accompanied by his wife again today and presents for his yearly check up. I last saw him on 01/26/2022, at which time he reported doing well. Was he was up-to-date with his supplies, still reported some short-term memory issues.  He had difficulty with word finding and name recall in particular.  His MMSE was 28 at the time.  I suggested we proceed with neuropsychological evaluation with neuropsychology.    He saw Dr. Kieth Brightly in consultation on 07/21/2022 for cognitive evaluation and testing on 09/02/2022 as well as feedback appointment on 12/01/2022.  I reviewed the impression and recommendations:   <<Impression/Diagnosis:                     Overall the results of the current neuropsychological evaluation do show consistency between subjective reports of changes in memory and learning and deficits with regard to targeted naming but no significant changes in lexical fluency.  The patient has a past medical history of subcortical stroke with pontine ischemic/infarct consistent with past medical history.  Previous brain studies have suggested some degree of subcortical foci of chronic small vessel ischemic disease although this was not severe or significant relative to age.  The patient is showing an overall decrease in global cognitive functioning with the greatest impact having to do with learning and memory both auditory and visual with visual greater than auditory deficits, changes in visual and auditory reasoning and problem-solving from premorbid estimates but only mild changes related to visual spatial visual constructional capacity.  There is no  indication of lateralization of findings relative to cognitive domains outside of motor functioning.   As far as diagnostic considerations the most likely culprit for these changes have to do with microvascular ischemic change/small vessel disease impacting subcortical brain regions.  The type of memory deficits were particularly loaded on memory components for hippocampal brain regions but there were some indications of changes in cortical function as well.  As this is the first objective assessment of this wide range of cognitive domains the patient would meet a diagnostic criterion for moderate cognitive impairment that appears to be somewhat progressive in nature.  I suspect this is likely vascular in nature related to small vessel disease but there are some changes consistent with cortical brain regions as well and at this point we cannot completely rule out the possibility of an underlying cortical dementia such as Alzheimer's dementia.  The patient does not show patterns consistent with Lewy body dementia.  It will be imperative that the patient continue to follow medical advice around cerebrovascular health and maintaining diligent efforts around his significant obstructive sleep apnea and hypertensive state as does both significantly increased risk of further stroke.  We will need to retest the patient in approximately 9 to 12 months to get a more definitive question answered regarding any cortical changes consistent with an Alzheimer's type condition but at this point the most likely culprit is vascular in nature.   I will sit down with the patient and his wife and go over the results of the current neuropsychological evaluation.  At this point, the patient is competent and maintains reasoning and  problem-solving capacity to continue to engage in a full range of life activities.  Visual spatial visual constructional abilities appear to be generally intact although changing.  We will need to monitor the  patient going forward but he is able to complete ADLs independently.  There will need to likely need to be some behavioral changes as he engages in day-to-day activities that tend to heighten his stress and frustration which then have a negative impact with his relationships and quality of life.  I would stress for the patient to engage in some more positive activities and avoid/curtail his obsessional type viewing of cable news and whether events as they clearly cause stress and become obsessional to some degree.    Diagnosis:                                Moderate cognitive impairment   Right pontine cerebrovascular accident (HCC)   Short-term memory loss   Word finding difficulty   OSA (obstructive sleep apnea) >>  Today 01/27/2023: I reviewed his BiPAP compliance data from 12/26/2022 through 01/24/2023, which is a total of 30 days, during which time he used his machine 29 days with percent use days greater than 4 hours at 97%, indicating excellent compliance with an average usage of 6 hours and 37 minutes, residual AHI at goal at 2.5/h, leak acceptable with the 95th percentile at 13 L/min on a pressure of 17/13 cm.  He reports doing well, feels stable with his memory. UTD with supplies but BiPAP machine acts up at times, does not always record his usage correctly and also the auto off and on does not work anymore.  He would like to get a new machine.  He is okay with proceeding with a home sleep test for reassessment.  He recalls not doing well with his CPAP and would like to continue with the BiPAP machine.  About 6 weeks ago he had to temporarily halt using his BiPAP machine because he had treatment to his face for precancerous lesions per dermatology and his skin was sloughing and tender.  Of note, he had a sleep study through our sleep lab on 12/20/2017 which was a split-night sleep study.  Baseline AHI was 48.3/h, O2 nadir 74%.  He was started on CPAP of 12 cm at the time.   The patient's  allergies, current medications, family history, past medical history, past social history, past surgical history and problem list were reviewed and updated as appropriate.    Previously:      I saw him on 01/28/2021, at which time he was compliant with his BiPAP but reported that he did not sleep well through the night.  His wife was worried about his short-term memory.  We proceeded with a brain MRI.  He had a brain MRI without contrast on 02/11/2021 and I reviewed the results: IMPRESSION:    MRI brain without contrast demonstrating: -Mild perisylvian atrophy. -Mild chronic small vessel ischemic disease. -Chronic right paramedian pontine ischemic infarct.   They were notified via phone call.   I reviewed his BiPAP compliance data from 12/26/2021 through 01/24/2022, which is a total of 30 days, during which time he used his machine 29 days with percent use days greater than 4 hours at 93%, indicating excellent compliance with an average usage of 6 hours and 48 minutes, residual AHI at goal at 1/h, leak acceptable with the 95th percentile at 11.6 L/min on a  pressure of 17/13 cm.    I saw him on 11/06/18, at which time he was compliant with treatment and BiPAP therapy.  He was advised to follow-up in 1 year.   He saw Ihor Austin, NP in the interim on 12/02/2019, at which time he continued to do well on BiPAP of 17/13.   I reviewed his BiPAP compliance data from 12/27/2020 through 01/25/2021, which is a total of 30 days, during which time he used his machine 28 days with percent use days greater than 4 hours at 87%, indicating very good compliance with an average usage for days on treatment of 6 hours and 30 minutes, residual AHI at goal at 0.4/h, leak on the low side, with the 95th percentile at 7.3 L/min on a pressure of 17/13 centimeters.       I saw him on 05/03/2018, at which time he was able to tolerate the BiPAP pressure. He was sleeping better at night per wife's report. His residual sleep  disordered breathing improved after we increase the pressure to 16/12. I suggested we increase it further to 17/13 due to residual AHI around 10 at the time.   I reviewed his compliance data in the interim and AHI had improved. He was advised to continue with the pressure of 17/13 cm.   He was seen in the interim in stroke clinic for follow-up on 07/06/2018.   I reviewed his BiPAP compliance data from 10/03/2018 through 11/01/2018 which is a total of 30 days, during which time he used his BiPAP every night except for 1, with percent used days greater than 4 hours at 97%, indicating excellent compliance with an average usage of 7 hours and 4 minutes, residual AHI at goal at 1.9 per hour, leak on the low side with the 95th percentile at 2.1 L/m on a pressure of 17/13.    I saw him on 03/22/2018, at which time he was compliant with BiPAP but his residual AHI was around 15. I suggested we increase the pressure. He did report that sometimes it felt like he was not getting enough air in. Overall, he felt an improvement in his sleep quality and his wife noted that his daytime energy was better.    I reviewed his BiPAP compliance data from 04/02/2018 through 05/01/2018 which is a total of 30 days, during which time he used his BiPAP every night with percent used days greater than 4 hours at 100%, indicating superb compliance with an average usage of 6 hours and 40 minutes, residual AHI slightly suboptimal still at 10 per hour but improved from before, leak low with the 95th percentile at 3.4 L/m on a pressure of 16/12 cm.    I first met him on 11/14/2017 at the request of Dr. Roda Shutters, at which time he was reported to have witnessed breathing pauses while asleep, snoring and excessive daytime somnolence. He was advised to proceed with sleep study testing. He had a split-night sleep study on 12/20/2017. I went over his test results with him in detail today. He had significant sleep disruption at baseline, he had severe  events, particularly in rem sleep. He did fairly well on CPAP but did have difficulty maintaining sleep later on. He was briefly tried on BiPAP of 14/10 but barely achieved any sleep on BiPAP during the study. I suggested a home treatment pressure of 12 cm. He called in the interim reporting residual increase in events. His AHI was around 15 on a pressure of 12 cm and  in mid-May I changed his pressure setting to BiPAP of 14/10. He has had difficulty with mask tolerance  and seal and try different masks in the interim. He does report difficulty getting in touch with his DME company recently.     I reviewed his BiPAP compliance data from the last 30 days which is from 02/19/2018 through 03/20/2018, during which time he used his machine every night except for 1, percent used days greater than 4 hours was 97% which is excellent, average usage also excellent at 6 hours and 59 minutes, residual AHI suboptimal at 14.8 per hour primarily because of obstructive events. Previously on CPAP he did have more central events. Seal is very good, 95th percentile of leak at 2.6 L/m on a pressure of 14/10. Ramp time per wife's report is 5 minutes.    11/14/2017: (He) reports snoring and excessive daytime somnolence. He has been witnessed to have witnessed breathing pauses while asleep. He has woken up with a sense of shortness of breath. He had extensive stroke workup during his hospitalization from 10/22/2017 through 10/24/2017. He was in an inpatient rehabilitation from 10/24/2017 through 10/31/2017. I reviewed the hospital records. His Epworth sleepiness score is 9 out of 24 today, fatigue score is 12 out of 63.  He sleeps on his sides and back. BT is around 11 PM and WT around 7 AM. He is retired from [employer], IT trainer. Quit smoking about 40 years, no EtOH, he does not drink caffeine on a regular basis, no FHx of OSA. He had a TE as a child.  He denies AM HAs, has nocturia about 1-2/night on average. He takes a nap in  the afternoons sometimes.       His Past Medical History Is Significant For: Past Medical History:  Diagnosis Date   CERUMEN IMPACTION 08/22/2009   HYPERLIPIDEMIA 06/06/2009   PREDIABETES 07/13/2010   Stroke (HCC)     His Past Surgical History Is Significant For: Past Surgical History:  Procedure Laterality Date   TONSILLECTOMY      His Family History Is Significant For: Family History  Problem Relation Age of Onset   Stroke Father    Heart disease Father    Diabetes Father        type ll   Alcohol abuse Other    Stroke Brother     His Social History Is Significant For: Social History   Socioeconomic History   Marital status: Married    Spouse name: Not on file   Number of children: Not on file   Years of education: Not on file   Highest education level: Not on file  Occupational History   Occupation: Retired  Tobacco Use   Smoking status: Former    Packs/day: 1.00    Years: 20.00    Additional pack years: 0.00    Total pack years: 20.00    Types: Cigarettes    Quit date: 08/11/1983    Years since quitting: 39.4   Smokeless tobacco: Never  Vaping Use   Vaping Use: Never used  Substance and Sexual Activity   Alcohol use: No   Drug use: No   Sexual activity: Not on file  Other Topics Concern   Not on file  Social History Narrative   Not on file   Social Determinants of Health   Financial Resource Strain: Low Risk  (10/27/2021)   Overall Financial Resource Strain (CARDIA)    Difficulty of Paying Living Expenses: Not hard at all  Food  Insecurity: No Food Insecurity (10/27/2021)   Hunger Vital Sign    Worried About Running Out of Food in the Last Year: Never true    Ran Out of Food in the Last Year: Never true  Transportation Needs: No Transportation Needs (10/27/2021)   PRAPARE - Administrator, Civil Service (Medical): No    Lack of Transportation (Non-Medical): No  Physical Activity: Insufficiently Active (10/27/2021)   Exercise Vital  Sign    Days of Exercise per Week: 3 days    Minutes of Exercise per Session: 30 min  Stress: No Stress Concern Present (10/27/2021)   [employer] of Occupational Health - Occupational Stress Questionnaire    Feeling of Stress : Not at all  Social Connections: Socially Integrated (10/27/2021)   Social Connection and Isolation Panel [NHANES]    Frequency of Communication with Friends and Family: More than three times a week    Frequency of Social Gatherings with Friends and Family: More than three times a week    Attends Religious Services: More than 4 times per year    Active Member of [employer] or Organizations: Yes    Attends Engineer, structural: More than 4 times per year    Marital Status: Married    His Allergies Are:  No Known Allergies:   His Current Medications Are:  Outpatient Encounter Medications as of 01/27/2023  Medication Sig   atorvastatin (LIPITOR) 80 MG tablet Take 1 tablet (80 mg total) by mouth daily.   Cholecalciferol (VITAMIN D3) 50 MCG (2000 UT) capsule Take 2,000 Units by mouth daily.   clopidogrel (PLAVIX) 75 MG tablet Take 1 tablet (75 mg total) by mouth daily.   lisinopril (ZESTRIL) 2.5 MG tablet Take 1 tablet (2.5 mg total) by mouth daily.   No facility-administered encounter medications on file as of 01/27/2023.  :  Review of Systems:  Out of a complete 14 point review of systems, all are reviewed and negative with the exception of these symptoms as listed below:   Review of Systems  Neurological:        Cpap follow up.  Needs new machine.  ESS 1.      Objective:  Neurological Exam  Physical Exam Physical Examination:   Vitals:   01/27/23 0737  BP: 125/67  Pulse: (!) 54    General Examination: The patient is a very pleasant 77 y.o. male in no acute distress. He appears well-developed and well-nourished and well groomed.   HEENT: Normocephalic, atraumatic, pupils are equal, round and reactive, corrective eyeglasses in place.  Extraocular tracking is good, he is status post cataract surgeries. Face is symmetric, speech clear.    Chest: Clear to auscultation without wheezing, rhonchi or crackles noted.   Heart: S1+S2+0, regular and normal without murmurs, rubs or gallops noted.    Abdomen: Soft, non-tender and non-distended.  Normal bowel sounds.   Extremities: There is no obvious edema.      Skin: Warm and dry without trophic changes noted. Sun exposure type changes.    Musculoskeletal: exam reveals no obvious joint deformities.   Neurologically:  Mental status: The patient is awake, alert and oriented in all 4 spheres. His immediate and remote memory, attention, language skills and fund of knowledge are appropriate, wife provides some details of the history.  There is no evidence of aphasia, agnosia, apraxia or anomia. Speech is clear with normal prosody and enunciation. Thought process is linear. Mood is normal and affect is normal.  01/26/2022    8:16 AM  MMSE - Mini Mental State Exam  Orientation to time 4  Orientation to Place 5  Registration 3  Attention/ Calculation 5  Recall 3  Language- name 2 objects 2  Language- repeat 1  Language- follow 3 step command 2  Language- read & follow direction 1  Write a sentence 1  Copy design 1  Total score 28      On 01/26/2022: CDT: 4/4, AFT: 12/min.  Cranial nerves II - XII are as described above under HEENT exam.  Motor exam: Normal bulk, and tone is noted. There is no tremor. Fine motor skills and coordination: grossly intact.  Cerebellar testing: No dysmetria or intention tremor. There is no truncal or gait ataxia.  Sensory exam: intact to light touch.  Gait, station and balance: He stands easily. No veering to one side is noted. No leaning to one side is noted. Posture is age-appropriate and stance is narrow based. Gait shows normal stride length and normal pace. No problems turning are noted, no obvious limp, no walking aid.               Assessment and Plan:  In summary, DONOLD SHEFFLER is a very pleasant 77 year old male with an underlying medical history of diabetes, right pontine stroke (seen by Dr. Pearlean Brownie and Ihor Austin), hyperlipidemia, and borderline overweight state, who presents for follow-up consultation of his obstructive sleep apnea which was determined to be in the severe range. He had a split-night study in April 2019. He started CPAP therapy for about a month and we switched to BiPAP therapy secondary due to central apneas noted and for better tolerance.  He did a lot better with BiPAP therapy and has been on initially 16 over 12 cm and then 17 over 13 cm since August 2019.  He continues to be fully compliant with treatment, he is eligible for a new machine.  We will proceed with a home sleep test for reassessment and then I will write for a new BiPAP machine.  We will forego a trial of AutoPap or CPAP as he has done quite well on BiPAP for years.  He is advised to not use his BiPAP machine the night of testing, I explained the home test procedure to the patient and his wife.  We will continue to monitor his memory.  We may initiate medication in the future for memory loss. We did a brain MRI in June 2022, which showed stable findings. MMSE in May 2023 was quite benign, neuropsychological evaluation with Dr. Kieth Brightly in November and December 2023 supported diagnosis of cognitive impairment. He had started taking Prevagen.  He is advised to continue to pursue a healthy lifestyle including good nutrition, good hydration with water and physical activity.  We will plan a follow-up after his new BiPAP set up date. I answered all the questions today and the patient and his wife are in agreement. I spent 30 minutes in total face-to-face time and in reviewing records during pre-charting, more than 50% of which was spent in counseling and coordination of care, reviewing test results, reviewing medications and treatment regimen and/or  in discussing or reviewing the diagnosis of OSA, BiPAP treatment, cognitive impairment, the prognosis and treatment options. Pertinent laboratory and imaging test results that were available during this visit with the patient were reviewed by me and considered in my medical decision making (see chart for details).

## 2023-02-08 ENCOUNTER — Telehealth: Payer: Self-pay | Admitting: Neurology

## 2023-02-08 NOTE — Telephone Encounter (Signed)
HST- Medicare/BCBS fed no auth req   Patient is scheduled at [facility] For 03/02/23 at 8 AM  Mailed packet to the patient.

## 2023-02-10 DIAGNOSIS — M9903 Segmental and somatic dysfunction of lumbar region: Secondary | ICD-10-CM | POA: Diagnosis not present

## 2023-02-10 DIAGNOSIS — M5431 Sciatica, right side: Secondary | ICD-10-CM | POA: Diagnosis not present

## 2023-02-28 DIAGNOSIS — M5431 Sciatica, right side: Secondary | ICD-10-CM | POA: Diagnosis not present

## 2023-02-28 DIAGNOSIS — M9903 Segmental and somatic dysfunction of lumbar region: Secondary | ICD-10-CM | POA: Diagnosis not present

## 2023-03-02 ENCOUNTER — Ambulatory Visit (INDEPENDENT_AMBULATORY_CARE_PROVIDER_SITE_OTHER): Payer: Medicare Other | Admitting: Neurology

## 2023-03-02 DIAGNOSIS — Z789 Other specified health status: Secondary | ICD-10-CM

## 2023-03-02 DIAGNOSIS — G4733 Obstructive sleep apnea (adult) (pediatric): Secondary | ICD-10-CM

## 2023-03-02 DIAGNOSIS — R4189 Other symptoms and signs involving cognitive functions and awareness: Secondary | ICD-10-CM

## 2023-03-03 NOTE — Progress Notes (Signed)
See procedure note.

## 2023-03-03 NOTE — Procedures (Signed)
[facility]  HOME SLEEP TEST (Watch PAT) REPORT  STUDY DATE: 03/02/2023  DOB: 09-15-1945  MRN: [id number]  ORDERING CLINICIAN: Huston Foley, MD, PhD   REFERRING CLINICIAN: Kristian Covey, MD   CLINICAL INFORMATION/HISTORY: 77 year old right-handed gentleman with an underlying medical history of diabetes, R pontine stroke, hyperlipidemia, and overweight state, who presents for reevaluation of his obstructive sleep apnea.  He has been compliant with BiPAP of 17/13 cm with good apnea control and good tolerance.  He has a history of CPAP intolerance.  Epworth sleepiness score: 1/24.  BMI: 25.5 kg/m  FINDINGS:   Sleep Summary:   Total Recording Time (hours, min): 8 hours, 4 min  Total Sleep Time (hours, min):  6 hours, 47 min  Percent REM (%):    22.1%   Respiratory Indices:   Calculated pAHI (per hour):  27/hour         REM pAHI:    17.7/hour       NREM pAHI: 29.7/hour  Central pAHI: 2.8/hour  Oxygen Saturation Statistics:    Oxygen Saturation (%) Mean: 93%   Minimum oxygen saturation (%):                 82%   O2 Saturation Range (%): 82 - 99%    O2 Saturation (minutes) <=88%: 0.8 min  Pulse Rate Statistics:   Pulse Mean (bpm):    57/min    Pulse Range (45 - 102/min)   IMPRESSION: OSA (obstructive sleep apnea), moderate  History of CPAP intolerance  RECOMMENDATION:  This home sleep test demonstrates moderate obstructive sleep apnea with a total AHI of 27/hour and O2 nadir of 82%.  Mild to moderate snoring was detected, at times in the louder range.  Ongoing treatment with a positive airway pressure (PAP) device is recommended. The patient has been compliant with BiPAP of 17/13 cm with good tolerance and good apnea control.  He has a prior history of CPAP intolerance and I recommend ongoing treatment with BiPAP.  He should qualify for a new machine, which I will prescribe and we can keep the settings the same as his old machine. A full  night titration study may be considered to optimize treatment settings, monitor proper oxygen saturations and aid with improvement of tolerance and adherence, if needed down the road. Alternative treatment options may include a dental device through dentistry or orthodontics in selected patients or Inspire (hypoglossal nerve stimulator) in carefully selected patients (meeting inclusion criteria).  Concomitant weight loss is recommended (where clinically appropriate). Please note that untreated obstructive sleep apnea may carry additional perioperative morbidity. Patients with significant obstructive sleep apnea should receive perioperative PAP therapy and the surgeons and particularly the anesthesiologist should be informed of the diagnosis and the severity of the sleep disordered breathing. The patient should be cautioned not to drive, work at heights, or operate dangerous or heavy equipment when tired or sleepy. Review and reiteration of good sleep hygiene measures should be pursued with any patient. Other causes of the patient's symptoms, including circadian rhythm disturbances, an underlying mood disorder, medication effect and/or an underlying medical problem cannot be ruled out based on this test. Clinical correlation is recommended.  The patient and his referring provider will be notified of the test results. The patient will be seen in follow up in sleep clinic at [facility].  I certify that I have reviewed the raw data recording prior to the issuance of this report in accordance with the standards of the American Academy of  Sleep Medicine (AASM).    INTERPRETING PHYSICIAN:   Huston Foley, MD, PhD Medical Director, [facility]) Diplomat, ABPN (Neurology and Sleep)   [facility] [address] [place], [place] [address] [phone]

## 2023-03-03 NOTE — Addendum Note (Signed)
Addended by: Huston Foley on: 03/03/2023 06:26 PM   Modules accepted: Orders

## 2023-03-07 ENCOUNTER — Telehealth: Payer: Self-pay | Admitting: Neurology

## 2023-03-07 NOTE — Telephone Encounter (Signed)
-----   Message from Huston Foley, MD sent at 03/03/2023  6:26 PM EDT ----- Patient was last seen on 01/27/2023.  He has been on BiPAP of 17/13 cm with good apnea control but needs a new machine.  He had a home sleep test on 03/02/2023.  Please advise patient that his home sleep test showed moderate sleep apnea.  He should qualify for new equipment and I will write for a new BiPAP machine and supplies, we can keep the settings the same as his old machine, he can use his preferred mask.  He will need a follow-up appointment within 2 to 3 months after starting treatment with his new equipment.  Please ensure that he make an appointment in sleep clinic for follow-up with one of our nurse practitioners or myself.  Please reinforce continuing treatment with full compliance.  Thanks,   Huston Foley, MD, PhD [facility])

## 2023-03-07 NOTE — Telephone Encounter (Signed)
Relayed results to wife of pt.   I relayed will send order to [facility] and request urgent (would like end of week) since going OOT.  I could not promise this but will send message.  She appreciated that.   Order messaged to [employer].

## 2023-03-07 NOTE — Telephone Encounter (Signed)
Pt wife called. Stated she is following-up on Sleep Study results, stated pt needs new CPAP machine.

## 2023-03-08 DIAGNOSIS — D1801 Hemangioma of skin and subcutaneous tissue: Secondary | ICD-10-CM | POA: Diagnosis not present

## 2023-03-08 DIAGNOSIS — D485 Neoplasm of uncertain behavior of skin: Secondary | ICD-10-CM | POA: Diagnosis not present

## 2023-03-08 DIAGNOSIS — C44319 Basal cell carcinoma of skin of other parts of face: Secondary | ICD-10-CM | POA: Diagnosis not present

## 2023-03-08 DIAGNOSIS — L812 Freckles: Secondary | ICD-10-CM | POA: Diagnosis not present

## 2023-03-08 DIAGNOSIS — Z85828 Personal history of other malignant neoplasm of skin: Secondary | ICD-10-CM | POA: Diagnosis not present

## 2023-03-08 DIAGNOSIS — L820 Inflamed seborrheic keratosis: Secondary | ICD-10-CM | POA: Diagnosis not present

## 2023-03-08 DIAGNOSIS — L570 Actinic keratosis: Secondary | ICD-10-CM | POA: Diagnosis not present

## 2023-03-08 DIAGNOSIS — L821 Other seborrheic keratosis: Secondary | ICD-10-CM | POA: Diagnosis not present

## 2023-03-08 NOTE — Telephone Encounter (Signed)
New, Doristine Mango, RN; Milon Dikes; Kathe Becton Received, thank you!     Previous Messages    ----- Message ----- From: Guy Begin, RN Sent: 03/07/2023   1:56 PM EDT To: Henderson Newcomer; Kathyrn Sheriff; Santina Evans; * Subject: new bipap machine  (he is already client)      Good afternoon,  pt is requesting machine by end of week if this is possible.  (Going OOT and would like prior to leaving).    James Pugh Male, 77 y.o., 1946/02/07 MRN: [id number] Phone: [phone]   New bipap machine.  Order in [facility]  [place]

## 2023-03-21 ENCOUNTER — Ambulatory Visit (INDEPENDENT_AMBULATORY_CARE_PROVIDER_SITE_OTHER): Payer: Medicare Other

## 2023-03-21 VITALS — Ht 66.0 in | Wt 158.0 lb

## 2023-03-21 DIAGNOSIS — M9903 Segmental and somatic dysfunction of lumbar region: Secondary | ICD-10-CM | POA: Diagnosis not present

## 2023-03-21 DIAGNOSIS — Z0000 Encounter for general adult medical examination without abnormal findings: Secondary | ICD-10-CM | POA: Diagnosis not present

## 2023-03-21 DIAGNOSIS — M5431 Sciatica, right side: Secondary | ICD-10-CM | POA: Diagnosis not present

## 2023-03-21 NOTE — Patient Instructions (Addendum)
James Pugh , Thank you for taking time to come for your Medicare Wellness Visit. I appreciate your ongoing commitment to your health goals. Please review the following plan we discussed and let me know if I can assist you in the future.   These are the goals we discussed:  Goals       Patient Stated (pt-stated)      Maintain good healthy.        This is a list of the screening recommended for you and due dates:  Health Maintenance  Topic Date Due   Yearly kidney health urinalysis for diabetes  Never done   Eye exam for diabetics  02/18/2021   Complete foot exam   05/30/2021   Hemoglobin A1C  12/30/2022   COVID-19 Vaccine (4 - 2023-24 season) 04/06/2023*   Zoster (Shingles) Vaccine (2 of 2) 06/21/2023*   Flu Shot  04/14/2023   Yearly kidney function blood test for diabetes  06/29/2023   Medicare Annual Wellness Visit  03/20/2024   DTaP/Tdap/Td vaccine (3 - Td or Tdap) 11/10/2024   Pneumonia Vaccine  Completed   Hepatitis C Screening  Completed   HPV Vaccine  Aged Out   Colon Cancer Screening  Discontinued  *Topic was postponed. The date shown is not the original due date.    Advanced directives: Please bring a copy of your health care power of attorney and living will to the office to be added to your chart at your convenience.   Conditions/risks identified: None  Next appointment: Follow up in one year for your annual wellness visit.   Preventive Care 79 Years and Older, Male  Preventive care refers to lifestyle choices and visits with your health care provider that can promote health and wellness. What does preventive care include? A yearly physical exam. This is also called an annual well check. Dental exams once or twice a year. Routine eye exams. Ask your health care provider how often you should have your eyes checked. Personal lifestyle choices, including: Daily care of your teeth and gums. Regular physical activity. Eating a healthy diet. Avoiding tobacco and  drug use. Limiting alcohol use. Practicing safe sex. Taking low doses of aspirin every day. Taking vitamin and mineral supplements as recommended by your health care provider. What happens during an annual well check? The services and screenings done by your health care provider during your annual well check will depend on your age, overall health, lifestyle risk factors, and family history of disease. Counseling  Your health care provider may ask you questions about your: Alcohol use. Tobacco use. Drug use. Emotional well-being. Home and relationship well-being. Sexual activity. Eating habits. History of falls. Memory and ability to understand (cognition). Work and work Astronomer. Screening  You may have the following tests or measurements: Height, weight, and BMI. Blood pressure. Lipid and cholesterol levels. These may be checked every 5 years, or more frequently if you are over 75 years old. Skin check. Lung cancer screening. You may have this screening every year starting at age 66 if you have a 30-pack-year history of smoking and currently smoke or have quit within the past 15 years. Fecal occult blood test (FOBT) of the stool. You may have this test every year starting at age 44. Flexible sigmoidoscopy or colonoscopy. You may have a sigmoidoscopy every 5 years or a colonoscopy every 10 years starting at age 57. Prostate cancer screening. Recommendations will vary depending on your family history and other risks. Hepatitis C blood test. Hepatitis  B blood test. Sexually transmitted disease (STD) testing. Diabetes screening. This is done by checking your blood sugar (glucose) after you have not eaten for a while (fasting). You may have this done every 1-3 years. Abdominal aortic aneurysm (AAA) screening. You may need this if you are a current or former smoker. Osteoporosis. You may be screened starting at age 37 if you are at high risk. Talk with your health care provider  about your test results, treatment options, and if necessary, the need for more tests. Vaccines  Your health care provider may recommend certain vaccines, such as: Influenza vaccine. This is recommended every year. Tetanus, diphtheria, and acellular pertussis (Tdap, Td) vaccine. You may need a Td booster every 10 years. Zoster vaccine. You may need this after age 20. Pneumococcal 13-valent conjugate (PCV13) vaccine. One dose is recommended after age 61. Pneumococcal polysaccharide (PPSV23) vaccine. One dose is recommended after age 55. Talk to your health care provider about which screenings and vaccines you need and how often you need them. This information is not intended to replace advice given to you by your health care provider. Make sure you discuss any questions you have with your health care provider. Document Released: 09/26/2015 Document Revised: 05/19/2016 Document Reviewed: 07/01/2015 Elsevier Interactive Patient Education  2017 [employer].  Fall Prevention in the Home Falls can cause injuries. They can happen to people of all ages. There are many things you can do to make your home safe and to help prevent falls. What can I do on the outside of my home? Regularly fix the edges of walkways and driveways and fix any cracks. Remove anything that might make you trip as you walk through a door, such as a raised step or threshold. Trim any bushes or trees on the path to your home. Use bright outdoor lighting. Clear any walking paths of anything that might make someone trip, such as rocks or tools. Regularly check to see if handrails are loose or broken. Make sure that both sides of any steps have handrails. Any raised decks and porches should have guardrails on the edges. Have any leaves, snow, or ice cleared regularly. Use sand or salt on walking paths during winter. Clean up any spills in your garage right away. This includes oil or grease spills. What can I do in the  bathroom? Use night lights. Install grab bars by the toilet and in the tub and shower. Do not use towel bars as grab bars. Use non-skid mats or decals in the tub or shower. If you need to sit down in the shower, use a plastic, non-slip stool. Keep the floor dry. Clean up any water that spills on the floor as soon as it happens. Remove soap buildup in the tub or shower regularly. Attach bath mats securely with double-sided non-slip rug tape. Do not have throw rugs and other things on the floor that can make you trip. What can I do in the bedroom? Use night lights. Make sure that you have a light by your bed that is easy to reach. Do not use any sheets or blankets that are too big for your bed. They should not hang down onto the floor. Have a firm chair that has side arms. You can use this for support while you get dressed. Do not have throw rugs and other things on the floor that can make you trip. What can I do in the kitchen? Clean up any spills right away. Avoid walking on wet floors. Keep  items that you use a lot in easy-to-reach places. If you need to reach something above you, use a strong step stool that has a grab bar. Keep electrical cords out of the way. Do not use floor polish or wax that makes floors slippery. If you must use wax, use non-skid floor wax. Do not have throw rugs and other things on the floor that can make you trip. What can I do with my stairs? Do not leave any items on the stairs. Make sure that there are handrails on both sides of the stairs and use them. Fix handrails that are broken or loose. Make sure that handrails are as long as the stairways. Check any carpeting to make sure that it is firmly attached to the stairs. Fix any carpet that is loose or worn. Avoid having throw rugs at the top or bottom of the stairs. If you do have throw rugs, attach them to the floor with carpet tape. Make sure that you have a light switch at the top of the stairs and the  bottom of the stairs. If you do not have them, ask someone to add them for you. What else can I do to help prevent falls? Wear shoes that: Do not have high heels. Have rubber bottoms. Are comfortable and fit you well. Are closed at the toe. Do not wear sandals. If you use a stepladder: Make sure that it is fully opened. Do not climb a closed stepladder. Make sure that both sides of the stepladder are locked into place. Ask someone to hold it for you, if possible. Clearly mark and make sure that you can see: Any grab bars or handrails. First and last steps. Where the edge of each step is. Use tools that help you move around (mobility aids) if they are needed. These include: Canes. Walkers. Scooters. Crutches. Turn on the lights when you go into a dark area. Replace any light bulbs as soon as they burn out. Set up your furniture so you have a clear path. Avoid moving your furniture around. If any of your floors are uneven, fix them. If there are any pets around you, be aware of where they are. Review your medicines with your doctor. Some medicines can make you feel dizzy. This can increase your chance of falling. Ask your doctor what other things that you can do to help prevent falls. This information is not intended to replace advice given to you by your health care provider. Make sure you discuss any questions you have with your health care provider. Document Released: 06/26/2009 Document Revised: 02/05/2016 Document Reviewed: 10/04/2014 Elsevier Interactive Patient Education  2017 [employer].

## 2023-03-21 NOTE — Progress Notes (Signed)
Subjective:   James Pugh is a 77 y.o. male who presents for Medicare Annual/Subsequent preventive examination.  Visit Complete: Virtual  I connected with  James Pugh on 03/21/23 by a audio enabled telemedicine application and verified that I am speaking with the correct person using two identifiers.  Patient Location: Home  Provider Location: Home Office  I discussed the limitations of evaluation and management by telemedicine. The patient expressed understanding and agreed to proceed.  Patient Medicare AWV questionnaire was completed by the patient on ; I have confirmed that all information answered by patient is correct and no changes since this date.  Review of Systems     Cardiac Risk Factors include: advanced age (>4men, >51 women);diabetes mellitus;male gender;hypertension     Objective:    Today's Vitals   03/21/23 0956  Weight: 158 lb (71.7 kg)  Height: 5\' 6"  (1.676 m)   Body mass index is 25.5 kg/m.     03/21/2023   10:12 AM 10/27/2021    9:35 AM 07/09/2021    7:12 PM 01/03/2021   12:56 PM 08/26/2020    9:06 AM 05/24/2018    9:00 PM 05/24/2018    8:59 AM  Advanced Directives  Does Patient Have a Medical Advance Directive? Yes Yes No Yes Yes Yes Yes  Type of Estate agent of [place];Living will Healthcare Power of [place];Living will  Healthcare Power of [place];Living will Healthcare Power of [place];Living will Healthcare Power of Attorney   Does patient want to make changes to medical advance directive?  No - Patient declined  No - Patient declined  No - Patient declined No - Patient declined  Copy of Healthcare Power of Attorney in Chart? No - copy requested No - copy requested  No - copy requested No - copy requested      Current Medications (verified) Outpatient Encounter Medications as of 03/21/2023  Medication Sig   atorvastatin (LIPITOR) 80 MG tablet Take 1 tablet (80 mg total) by mouth daily.   Cholecalciferol (VITAMIN  D3) 50 MCG (2000 UT) capsule Take 2,000 Units by mouth daily.   clopidogrel (PLAVIX) 75 MG tablet Take 1 tablet (75 mg total) by mouth daily.   lisinopril (ZESTRIL) 2.5 MG tablet Take 1 tablet (2.5 mg total) by mouth daily.   No facility-administered encounter medications on file as of 03/21/2023.    Allergies (verified) Patient has no known allergies.   History: Past Medical History:  Diagnosis Date   CERUMEN IMPACTION 08/22/2009   HYPERLIPIDEMIA 06/06/2009   PREDIABETES 07/13/2010   Stroke [facility])    Past Surgical History:  Procedure Laterality Date   TONSILLECTOMY     Family History  Problem Relation Age of Onset   Stroke Father    Heart disease Father    Diabetes Father        type ll   Alcohol abuse Other    Stroke Brother    Social History   Socioeconomic History   Marital status: Married    Spouse name: Not on file   Number of children: Not on file   Years of education: Not on file   Highest education level: Not on file  Occupational History   Occupation: Retired  Tobacco Use   Smoking status: Former    Packs/day: 1.00    Years: 20.00    Additional pack years: 0.00    Total pack years: 20.00    Types: Cigarettes    Quit date: 08/11/1983    Years since  quitting: 39.6   Smokeless tobacco: Never  Vaping Use   Vaping Use: Never used  Substance and Sexual Activity   Alcohol use: No   Drug use: No   Sexual activity: Not on file  Other Topics Concern   Not on file  Social History Narrative   Not on file   Social Determinants of Health   Financial Resource Strain: Low Risk  (03/21/2023)   Overall Financial Resource Strain (CARDIA)    Difficulty of Paying Living Expenses: Not hard at all  Food Insecurity: No Food Insecurity (03/21/2023)   Hunger Vital Sign    Worried About Running Out of Food in the Last Year: Never true    Ran Out of Food in the Last Year: Never true  Transportation Needs: No Transportation Needs (03/21/2023)   PRAPARE - Therapist, art (Medical): No    Lack of Transportation (Non-Medical): No  Physical Activity: Inactive (03/21/2023)   Exercise Vital Sign    Days of Exercise per Week: 0 days    Minutes of Exercise per Session: 0 min  Stress: No Stress Concern Present (03/21/2023)   [employer] of Occupational Health - Occupational Stress Questionnaire    Feeling of Stress : Not at all  Social Connections: Socially Integrated (03/21/2023)   Social Connection and Isolation Panel [NHANES]    Frequency of Communication with Friends and Family: More than three times a week    Frequency of Social Gatherings with Friends and Family: More than three times a week    Attends Religious Services: More than 4 times per year    Active Member of [employer] or Organizations: Yes    Attends Engineer, structural: More than 4 times per year    Marital Status: Married    Tobacco Counseling Counseling given: Not Answered   Clinical Intake:  Pre-visit preparation completed: NoInformation entered by :: Theresa Mulligan LPN   Activities of Daily Living    03/21/2023   10:11 AM  In your present state of health, do you have any difficulty performing the following activities:  Hearing? 0  Vision? 0  Difficulty concentrating or making decisions? 0  Walking or climbing stairs? 0  Dressing or bathing? 0  Doing errands, shopping? 0  Preparing Food and eating ? N  Using the Toilet? N  In the past six months, have you accidently leaked urine? N  Do you have problems with loss of bowel control? N  Managing your Medications? N  Managing your Finances? N  Housekeeping or managing your Housekeeping? N    Patient Care Team: Kristian Covey, MD as PCP - General [place], Vella Kohler, LCSW as Social Worker (Licensed Clinical Social Worker)  Indicate any recent [employer] you may have received from other than [facility] providers in the past year (date may be approximate).     Assessment:   This is a routine  wellness examination for James Pugh.  Hearing/Vision screen Hearing Screening - Comments:: Denies hearing difficulties   Vision Screening - Comments:: Wears rx glasses - up to date with routine eye exams with  Dr Randon Goldsmith  Dietary issues and exercise activities discussed:     Goals Addressed               This Visit's Progress     Patient Stated (pt-stated)        Maintain good healthy.       Depression Screen    03/21/2023   10:04 AM 10/27/2021  9:30 AM 08/26/2020    9:08 AM 05/14/2019    9:42 AM 11/14/2017   12:53 PM 10/26/2016   10:08 AM 11/11/2014   11:06 AM  PHQ 2/9 Scores  PHQ - 2 Score 0 0 0 0 0 0 0  PHQ- 9 Score    0 0      Fall Risk    03/21/2023   10:12 AM 10/27/2021    9:34 AM 01/28/2021    7:32 AM 08/26/2020    9:08 AM 05/14/2019    9:42 AM  Fall Risk   Falls in the past year? 0 0 0 0 0  Number falls in past yr: 0 0  0   Injury with Fall? 0 0  0   Risk for fall due to : No Fall Risks No Fall Risks  Impaired vision   Follow up Falls prevention discussed   Falls prevention discussed     MEDICARE RISK AT HOME:  Medicare Risk at Home - 03/21/23 1016     Any stairs in or around the home? Yes    If so, are there any without handrails? No    Home free of loose throw rugs in walkways, pet beds, electrical cords, etc? Yes    Adequate lighting in your home to reduce risk of falls? Yes    Life alert? No    Use of a cane, walker or w/c? No    Grab bars in the bathroom? No    Shower chair or bench in shower? Yes    Elevated toilet seat or a handicapped toilet? No             TIMED UP AND GO:  Was the test performed?  No    Cognitive Function:    01/26/2022    8:16 AM  MMSE - Mini Mental State Exam  Orientation to time 4  Orientation to Place 5  Registration 3  Attention/ Calculation 5  Recall 3  Language- name 2 objects 2  Language- repeat 1  Language- follow 3 step command 2  Language- read & follow direction 1  Write a sentence 1  Copy design 1   Total score 28        03/21/2023   10:13 AM 10/27/2021    9:35 AM 08/26/2020    9:12 AM  6CIT Screen  What Year? 0 points 0 points 0 points  What month? 0 points 0 points 0 points  What time? 0 points 0 points   Count back from 20 0 points 0 points 0 points  Months in reverse 0 points 0 points 0 points  Repeat phrase 0 points 0 points 0 points  Total Score 0 points 0 points     Immunizations Immunization History  Administered Date(s) Administered   Fluad Quad(high Dose 65+) 05/14/2019, 05/30/2020, 06/19/2021, 06/30/2022   Hepatitis A 06/06/2009   Influenza Split 08/11/2011, 06/21/2013   Influenza Whole 06/06/2009, 07/13/2010   Influenza, High Dose Seasonal PF 06/06/2018   Influenza-Unspecified 08/02/2012   PFIZER(Purple Top)SARS-COV-2 Vaccination 10/16/2019, 11/06/2019, 06/28/2020   PNEUMOCOCCAL CONJUGATE-20 05/25/2021   Pneumococcal Conjugate-13 10/24/2013   Pneumococcal Polysaccharide-23 06/06/2009   Td 09/13/2004   Tdap 11/11/2014   Zoster Recombinant(Shingrix) 02/11/2021    TDAP status: Up to date  Flu Vaccine status: Up to date  Pneumococcal vaccine status: Up to date  Covid-19 vaccine status: Completed vaccines  Qualifies for Shingles Vaccine? Yes   Zostavax completed Yes   Shingrix Completed?: Yes  Screening Tests Health Maintenance  Topic Date Due   Diabetic kidney evaluation - Urine ACR  Never done   OPHTHALMOLOGY EXAM  02/18/2021   FOOT EXAM  05/30/2021   HEMOGLOBIN A1C  12/30/2022   COVID-19 Vaccine (4 - 2023-24 season) 04/06/2023 (Originally 05/14/2022)   Zoster Vaccines- Shingrix (2 of 2) 06/21/2023 (Originally 04/08/2021)   INFLUENZA VACCINE  04/14/2023   Diabetic kidney evaluation - eGFR measurement  06/29/2023   Medicare Annual Wellness (AWV)  03/20/2024   DTaP/Tdap/Td (3 - Td or Tdap) 11/10/2024   Pneumonia Vaccine 54+ Years old  Completed   Hepatitis C Screening  Completed   HPV VACCINES  Aged Out   Colonoscopy  Discontinued    Health  Maintenance  Health Maintenance Due  Topic Date Due   Diabetic kidney evaluation - Urine ACR  Never done   OPHTHALMOLOGY EXAM  02/18/2021   FOOT EXAM  05/30/2021   HEMOGLOBIN A1C  12/30/2022    Colorectal cancer screening: No longer required.   Lung Cancer Screening: (Low Dose CT Chest recommended if Age 64-80 years, 20 pack-year currently smoking OR have quit w/in 15years.) does not qualify.     Additional Screening:  Hepatitis C Screening: does qualify; Completed 07/28/16  Vision Screening: Recommended annual ophthalmology exams for early detection of glaucoma and other disorders of the eye. Is the patient up to date with their annual eye exam?  Yes  Who is the provider or what is the name of the office in which the patient attends annual eye exams? Dr Randon Goldsmith If pt is not established with a provider, would they like to be referred to a provider to establish care? No .   Dental Screening: Recommended annual dental exams for proper oral hygiene  Diabetic Foot Exam: Diabetic Foot Exam: Overdue, Pt has been advised about the importance in completing this exam. Pt is scheduled for diabetic foot exam on Followed by PCP.  Community Resource Referral / Chronic Care Management:  CRR required this visit?  No   CCM required this visit?  No     Plan:     I have personally reviewed and noted the following in the patient's chart:   Medical and social history Use of alcohol, tobacco or illicit drugs  Current medications and supplements including opioid prescriptions. Patient is not currently taking opioid prescriptions. Functional ability and status Nutritional status Physical activity Advanced directives List of other physicians Hospitalizations, surgeries, and ER visits in previous 12 months Vitals Screenings to include cognitive, depression, and falls Referrals and appointments  In addition, I have reviewed and discussed with patient certain preventive protocols, quality  metrics, and best practice recommendations. A written personalized care plan for preventive services as well as general preventive health recommendations were provided to patient.     Tillie Rung, LPN   04/14/9561   After Visit Summary: (MyChart) Due to this being a telephonic visit, the after visit summary with patients personalized plan was offered to patient via MyChart   Nurse Notes: Patient due Hemoglobin A1C

## 2023-03-29 DIAGNOSIS — Z85828 Personal history of other malignant neoplasm of skin: Secondary | ICD-10-CM | POA: Diagnosis not present

## 2023-03-29 DIAGNOSIS — N481 Balanitis: Secondary | ICD-10-CM | POA: Diagnosis not present

## 2023-04-14 ENCOUNTER — Telehealth: Payer: Self-pay | Admitting: Neurology

## 2023-04-14 DIAGNOSIS — C44319 Basal cell carcinoma of skin of other parts of face: Secondary | ICD-10-CM | POA: Diagnosis not present

## 2023-04-14 DIAGNOSIS — Z85828 Personal history of other malignant neoplasm of skin: Secondary | ICD-10-CM | POA: Diagnosis not present

## 2023-04-14 NOTE — Telephone Encounter (Signed)
This is his recent usage on bipap as FYI:

## 2023-04-14 NOTE — Telephone Encounter (Signed)
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 Pt is concerned that there may be an issue with compliance by not wearing it. Pt would like a call back to discuss.

## 2023-04-14 NOTE — Telephone Encounter (Signed)
I spoke with the patient.  He anticipates it may just be a few nights of not using the BiPAP.  The surgical site is on his cheek.  I told him we do not encourage going without machine but if he is unable to use it then I recommended that he avoid sleeping on his back if at all possible and also would be helpful to slightly elevate head of bed around 20 to 30 degrees.  As of right now the patient's compliance is excellent. I asked him to give [place] an update at the beginning of next week if he has not been able to resume his machine use by then.  He verbalized appreciation for the call.

## 2023-04-15 NOTE — Telephone Encounter (Signed)
If  the site of surgery interferes with his mask and headgear , he will not be using it until healed - his dermatologist Alvy Beal will tell him when to return to using it.

## 2023-04-22 ENCOUNTER — Telehealth: Payer: Self-pay | Admitting: Family Medicine

## 2023-04-22 DIAGNOSIS — Z0000 Encounter for general adult medical examination without abnormal findings: Secondary | ICD-10-CM

## 2023-04-22 DIAGNOSIS — E119 Type 2 diabetes mellitus without complications: Secondary | ICD-10-CM

## 2023-04-22 NOTE — Telephone Encounter (Signed)
Pt wife is calling and her husband would like lab in advance

## 2023-04-25 DIAGNOSIS — L738 Other specified follicular disorders: Secondary | ICD-10-CM | POA: Diagnosis not present

## 2023-04-25 DIAGNOSIS — Z85828 Personal history of other malignant neoplasm of skin: Secondary | ICD-10-CM | POA: Diagnosis not present

## 2023-04-26 NOTE — Telephone Encounter (Signed)
Labs placed and patient's wife aware

## 2023-05-24 DIAGNOSIS — C44319 Basal cell carcinoma of skin of other parts of face: Secondary | ICD-10-CM | POA: Diagnosis not present

## 2023-05-24 DIAGNOSIS — D485 Neoplasm of uncertain behavior of skin: Secondary | ICD-10-CM | POA: Diagnosis not present

## 2023-05-24 DIAGNOSIS — Z85828 Personal history of other malignant neoplasm of skin: Secondary | ICD-10-CM | POA: Diagnosis not present

## 2023-05-30 ENCOUNTER — Telehealth: Payer: Self-pay | Admitting: Family Medicine

## 2023-05-30 NOTE — Telephone Encounter (Signed)
Pt's spouse called to ask if it would be okay for Pt to stop his blood thinners for 2 days before the mohs surgery on his face and for 2 days after the surgery?  Please advise.

## 2023-05-30 NOTE — Telephone Encounter (Signed)
Patient's wife informed of the message below and voiced understanding

## 2023-06-02 ENCOUNTER — Ambulatory Visit: Payer: Medicare Other | Admitting: Neurology

## 2023-06-14 ENCOUNTER — Ambulatory Visit (INDEPENDENT_AMBULATORY_CARE_PROVIDER_SITE_OTHER): Payer: Medicare Other | Admitting: Neurology

## 2023-06-14 ENCOUNTER — Telehealth: Payer: Self-pay | Admitting: *Deleted

## 2023-06-14 ENCOUNTER — Encounter: Payer: Self-pay | Admitting: Neurology

## 2023-06-14 VITALS — BP 112/65 | HR 50 | Ht 66.0 in | Wt 158.8 lb

## 2023-06-14 DIAGNOSIS — R413 Other amnesia: Secondary | ICD-10-CM

## 2023-06-14 DIAGNOSIS — R4189 Other symptoms and signs involving cognitive functions and awareness: Secondary | ICD-10-CM | POA: Diagnosis not present

## 2023-06-14 DIAGNOSIS — G4733 Obstructive sleep apnea (adult) (pediatric): Secondary | ICD-10-CM | POA: Diagnosis not present

## 2023-06-14 DIAGNOSIS — I6350 Cerebral infarction due to unspecified occlusion or stenosis of unspecified cerebral artery: Secondary | ICD-10-CM | POA: Diagnosis not present

## 2023-06-14 DIAGNOSIS — R001 Bradycardia, unspecified: Secondary | ICD-10-CM

## 2023-06-14 MED ORDER — MEMANTINE HCL 10 MG PO TABS: 10.0000 mg | ORAL_TABLET | Freq: Two times a day (BID) | ORAL | 5 refills | Status: DC

## 2023-06-14 NOTE — Telephone Encounter (Signed)
Bipap supplies message sent to [facility] team. Per Dr Frances Furbish, pt needs a small headgear and medium F40 mask.

## 2023-06-14 NOTE — Progress Notes (Signed)
Subjective:    Patient ID: James Pugh is a 77 y.o. male.  HPI    Interim history:   [name] is a 77 year old right-handed gentleman with an underlying medical history of diabetes, R pontine stroke, hyperlipidemia, memory loss and overweight state, who presents for follow-up consultation of his cognitive concerns as well as sleep apnea, on BiPAP therapy, after interim home sleep testing and starting a new BiPAP machine.  The patient is accompanied by his wife today.  I last saw him on 01/27/2023, at which time we talked about his neuropsychological evaluation report from November 2023.  He had seen Dr. Kieth Brightly.  He was compliant with his BiPAP.  He was eligible for new machine.  He was advised to proceed with a home sleep test for reevaluation.  He had an MMSE of 28 out of 30 at the time.  I suggested that we monitor his memory.  He had a home sleep test on 03/02/2023 which showed a total AHI of 27/h, O2 nadir 82% with mild to moderate snoring detected.  Given his CPAP intolerance in the past I prescribed a new BiPAP machine.  He has a [employer] air curve 10 VAuto BiPAP machine, set up date was 03/11/2023.  His DME company is [employer].  Today, 06/14/2023: I reviewed his BiPAP compliance data from 05/14/2023 through 06/12/2023, which is a total of 30 days, during which time he used his machine 28 days with percent use days greater than 4 hours at 90%, indicating excellent compliance with an average usage of 7 hours and 7 minutes for days on treatment, residual AHI at goal at 3.9/h, leak acceptable with the 95th percentile at 6 L/min on a pressure of 17/13 cm.  He reports doing well, has adjusted well to his new machine, no issues with tolerance of the pressure, likes the F40 mask from [employer] which is new to him.  He does need a smaller headgear which is difficult as the combination with small headgear and medium mask is difficult to find or order.  He has had some trouble getting his supplies.   He and his wife have noticed an increase in the leak in the last 10 to 14 days, also a slight increase in his events with that.  As far as his memory, he feels stable, wife reports that it is really just very short-term memory issues, otherwise he is doing well, he tries to hydrate well, could do a little better by self-report.  She points out that when he did the memory evaluation with the neuropsychologist it was the time constraints and that time pressure that made him perform less well in part.  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   Of note, he had a sleep study through our sleep lab on 12/20/2017 which was a split-night sleep study.  Baseline AHI was 48.3/h, O2 nadir 74%.  He was started on CPAP of 12 cm at the time.    The patient's allergies, current medications, family history, past medical history, past social history, past surgical history and problem list were reviewed and updated as appropriate.    Previously:     I saw him on 01/26/2022, at which time he reported doing well. He was up-to-date with his supplies, still reported some short-term  memory issues.  He had difficulty with word finding and name recall in particular.  His MMSE was 28 at the time.  I suggested we proceed with neuropsychological evaluation with neuropsychology.     He saw Dr. Kieth Brightly in consultation on 07/21/2022 for cognitive evaluation and testing on 09/02/2022 as well as feedback appointment on 12/01/2022.  I reviewed the impression and recommendations:    << Impression/Diagnosis:                     Overall the results of the current neuropsychological evaluation do show consistency between subjective reports of changes in memory and learning and deficits with regard to targeted naming but no significant changes in lexical fluency.  The  patient has a past medical history of subcortical stroke with pontine ischemic/infarct consistent with past medical history.  Previous brain studies have suggested some degree of subcortical foci of chronic small vessel ischemic disease although this was not severe or significant relative to age.  The patient is showing an overall decrease in global cognitive functioning with the greatest impact having to do with learning and memory both auditory and visual with visual greater than auditory deficits, changes in visual and auditory reasoning and problem-solving from premorbid estimates but only mild changes related to visual spatial visual constructional capacity.  There is no indication of lateralization of findings relative to cognitive domains outside of motor functioning.   As far as diagnostic considerations the most likely culprit for these changes have to do with microvascular ischemic change/small vessel disease impacting subcortical brain regions.  The type of memory deficits were particularly loaded on memory components for hippocampal brain regions but there were some indications of changes in cortical function as well.  As this is the first objective assessment of this wide range of cognitive domains the patient would meet a diagnostic criterion for moderate cognitive impairment that appears to be somewhat progressive in nature.  I suspect this is likely vascular in nature related to small vessel disease but there are some changes consistent with cortical brain regions as well and at this point we cannot completely rule out the possibility of an underlying cortical dementia such as Alzheimer's dementia.  The patient does not show patterns consistent with Lewy body dementia.  It will be imperative that the patient continue to follow medical advice around cerebrovascular health and maintaining diligent efforts around his significant obstructive sleep apnea and hypertensive state as does both significantly  increased risk of further stroke.  We will need to retest the patient in approximately 9 to 12 months to get a more definitive question answered regarding any cortical changes consistent with an Alzheimer's type condition but at this point the most likely culprit is vascular in nature.   I will sit down with the patient and his wife and go over the results of the current neuropsychological evaluation.  At this point, the patient is competent and maintains reasoning and problem-solving capacity to continue to engage in a full range of life activities.  Visual spatial visual constructional abilities appear to be generally intact although changing.  We will need to monitor the patient going forward but he is able to complete ADLs independently.  There will need to likely need to be some behavioral changes as he engages in day-to-day activities that tend to heighten his stress and frustration which then have a negative impact with his relationships and quality of life.  I would stress for the patient to engage in some  more positive activities and avoid/curtail his obsessional type viewing of cable news and whether events as they clearly cause stress and become obsessional to some degree.    Diagnosis:                                Moderate cognitive impairment   Right pontine cerebrovascular accident (HCC)   Short-term memory loss   Word finding difficulty   OSA (obstructive sleep apnea) >>   I reviewed his BiPAP compliance data from 12/26/2022 through 01/24/2023, which is a total of 30 days, during which time he used his machine 29 days with percent use days greater than 4 hours at 97%, indicating excellent compliance with an average usage of 6 hours and 37 minutes, residual AHI at goal at 2.5/h, leak acceptable with the 95th percentile at 13 L/min on a pressure of 17/13 cm.       I saw him on 01/28/2021, at which time he was compliant with his BiPAP but reported that he did not sleep well through the  night.  His wife was worried about his short-term memory.  We proceeded with a brain MRI.  He had a brain MRI without contrast on 02/11/2021 and I reviewed the results: IMPRESSION:    MRI brain without contrast demonstrating: -Mild perisylvian atrophy. -Mild chronic small vessel ischemic disease. -Chronic right paramedian pontine ischemic infarct.   They were notified via phone call.   I reviewed his BiPAP compliance data from 12/26/2021 through 01/24/2022, which is a total of 30 days, during which time he used his machine 29 days with percent use days greater than 4 hours at 93%, indicating excellent compliance with an average usage of 6 hours and 48 minutes, residual AHI at goal at 1/h, leak acceptable with the 95th percentile at 11.6 L/min on a pressure of 17/13 cm.    I saw him on 11/06/18, at which time he was compliant with treatment and BiPAP therapy.  He was advised to follow-up in 1 year.   He saw Ihor Austin, NP in the interim on 12/02/2019, at which time he continued to do well on BiPAP of 17/13.   I reviewed his BiPAP compliance data from 12/27/2020 through 01/25/2021, which is a total of 30 days, during which time he used his machine 28 days with percent use days greater than 4 hours at 87%, indicating very good compliance with an average usage for days on treatment of 6 hours and 30 minutes, residual AHI at goal at 0.4/h, leak on the low side, with the 95th percentile at 7.3 L/min on a pressure of 17/13 centimeters.       I saw him on 05/03/2018, at which time he was able to tolerate the BiPAP pressure. He was sleeping better at night per wife's report. His residual sleep disordered breathing improved after we increase the pressure to 16/12. I suggested we increase it further to 17/13 due to residual AHI around 10 at the time.   I reviewed his compliance data in the interim and AHI had improved. He was advised to continue with the pressure of 17/13 cm.   He was seen in the interim in  stroke clinic for follow-up on 07/06/2018.   I reviewed his BiPAP compliance data from 10/03/2018 through 11/01/2018 which is a total of 30 days, during which time he used his BiPAP every night except for 1, with percent used days greater than 4 hours  at 97%, indicating excellent compliance with an average usage of 7 hours and 4 minutes, residual AHI at goal at 1.9 per hour, leak on the low side with the 95th percentile at 2.1 L/m on a pressure of 17/13.    I saw him on 03/22/2018, at which time he was compliant with BiPAP but his residual AHI was around 15. I suggested we increase the pressure. He did report that sometimes it felt like he was not getting enough air in. Overall, he felt an improvement in his sleep quality and his wife noted that his daytime energy was better.    I reviewed his BiPAP compliance data from 04/02/2018 through 05/01/2018 which is a total of 30 days, during which time he used his BiPAP every night with percent used days greater than 4 hours at 100%, indicating superb compliance with an average usage of 6 hours and 40 minutes, residual AHI slightly suboptimal still at 10 per hour but improved from before, leak low with the 95th percentile at 3.4 L/m on a pressure of 16/12 cm.    I first met him on 11/14/2017 at the request of Dr. Roda Shutters, at which time he was reported to have witnessed breathing pauses while asleep, snoring and excessive daytime somnolence. He was advised to proceed with sleep study testing. He had a split-night sleep study on 12/20/2017. I went over his test results with him in detail today. He had significant sleep disruption at baseline, he had severe events, particularly in rem sleep. He did fairly well on CPAP but did have difficulty maintaining sleep later on. He was briefly tried on BiPAP of 14/10 but barely achieved any sleep on BiPAP during the study. I suggested a home treatment pressure of 12 cm. He called in the interim reporting residual increase in events.  His AHI was around 15 on a pressure of 12 cm and in mid-May I changed his pressure setting to BiPAP of 14/10. He has had difficulty with mask tolerance  and seal and try different masks in the interim. He does report difficulty getting in touch with his DME company recently.     I reviewed his BiPAP compliance data from the last 30 days which is from 02/19/2018 through 03/20/2018, during which time he used his machine every night except for 1, percent used days greater than 4 hours was 97% which is excellent, average usage also excellent at 6 hours and 59 minutes, residual AHI suboptimal at 14.8 per hour primarily because of obstructive events. Previously on CPAP he did have more central events. Seal is very good, 95th percentile of leak at 2.6 L/m on a pressure of 14/10. Ramp time per wife's report is 5 minutes.    11/14/2017: (He) reports snoring and excessive daytime somnolence. He has been witnessed to have witnessed breathing pauses while asleep. He has woken up with a sense of shortness of breath. He had extensive stroke workup during his hospitalization from 10/22/2017 through 10/24/2017. He was in an inpatient rehabilitation from 10/24/2017 through 10/31/2017. I reviewed the hospital records. His Epworth sleepiness score is 9 out of 24 today, fatigue score is 12 out of 63.  He sleeps on his sides and back. BT is around 11 PM and WT around 7 AM. He is retired from [employer], IT trainer. Quit smoking about 40 years, no EtOH, he does not drink caffeine on a regular basis, no FHx of OSA. He had a TE as a child.  He denies AM HAs, has nocturia  about 1-2/night on average. He takes a nap in the afternoons sometimes.     His Past Medical History Is Significant For: Past Medical History:  Diagnosis Date   CERUMEN IMPACTION 08/22/2009   HYPERLIPIDEMIA 06/06/2009   PREDIABETES 07/13/2010   Stroke (HCC)     His Past Surgical History Is Significant For: Past Surgical History:  Procedure Laterality Date    TONSILLECTOMY      His Family History Is Significant For: Family History  Problem Relation Age of Onset   Stroke Father    Heart disease Father    Diabetes Father        type ll   Alcohol abuse Other    Stroke Brother     His Social History Is Significant For: Social History   Socioeconomic History   Marital status: Married    Spouse name: Not on file   Number of children: Not on file   Years of education: Not on file   Highest education level: Not on file  Occupational History   Occupation: Retired  Tobacco Use   Smoking status: Former    Current packs/day: 0.00    Average packs/day: 1 pack/day for 20.0 years (20.0 ttl pk-yrs)    Types: Cigarettes    Start date: 08/11/1963    Quit date: 08/11/1983    Years since quitting: 39.8   Smokeless tobacco: Never  Vaping Use   Vaping status: Never Used  Substance and Sexual Activity   Alcohol use: No   Drug use: No   Sexual activity: Not on file  Other Topics Concern   Not on file  Social History Narrative   Not on file   Social Determinants of Health   Financial Resource Strain: Low Risk  (03/21/2023)   Overall Financial Resource Strain (CARDIA)    Difficulty of Paying Living Expenses: Not hard at all  Food Insecurity: No Food Insecurity (03/21/2023)   Hunger Vital Sign    Worried About Running Out of Food in the Last Year: Never true    Ran Out of Food in the Last Year: Never true  Transportation Needs: No Transportation Needs (03/21/2023)   PRAPARE - Administrator, Civil Service (Medical): No    Lack of Transportation (Non-Medical): No  Physical Activity: Inactive (03/21/2023)   Exercise Vital Sign    Days of Exercise per Week: 0 days    Minutes of Exercise per Session: 0 min  Stress: No Stress Concern Present (03/21/2023)   [employer] of Occupational Health - Occupational Stress Questionnaire    Feeling of Stress : Not at all  Social Connections: Socially Integrated (03/21/2023)   Social  Connection and Isolation Panel [NHANES]    Frequency of Communication with Friends and Family: More than three times a week    Frequency of Social Gatherings with Friends and Family: More than three times a week    Attends Religious Services: More than 4 times per year    Active Member of [employer] or Organizations: Yes    Attends Engineer, structural: More than 4 times per year    Marital Status: Married    His Allergies Are:  No Known Allergies:   His Current Medications Are:  Outpatient Encounter Medications as of 06/14/2023  Medication Sig   atorvastatin (LIPITOR) 80 MG tablet Take 1 tablet (80 mg total) by mouth daily.   Cholecalciferol (VITAMIN D3) 50 MCG (2000 UT) capsule Take 2,000 Units by mouth daily.   clopidogrel (PLAVIX) 75 MG  tablet Take 1 tablet (75 mg total) by mouth daily.   lisinopril (ZESTRIL) 2.5 MG tablet Take 1 tablet (2.5 mg total) by mouth daily.   No facility-administered encounter medications on file as of 06/14/2023.  :  Review of Systems:  Out of a complete 14 point review of systems, all are reviewed and negative with the exception of these symptoms as listed below:  Review of Systems  Neurological:        ESS 4, FSS 14.  Doing ok on new machine.   Set date 03-11-2023.    Objective:  Neurological Exam  Physical Exam Physical Examination:   Vitals:   06/14/23 0932  BP: 112/65  Pulse: (!) 50    General Examination: The patient is a very pleasant 77 y.o. male in no acute distress. He appears well-developed and well-nourished and well groomed.   HEENT: Normocephalic, atraumatic, pupils are equal, round and reactive, corrective eyeglasses in place. Extraocular tracking is good, he is status post cataract surgeries. Face is symmetric, speech clear.    Chest: Clear to auscultation without wheezing, rhonchi or crackles noted.   Heart: S1+S2+0, regular and normal without murmurs, rubs or gallops noted.    Abdomen: Soft, non-tender and  non-distended.  Normal bowel sounds.   Extremities: There is no obvious edema.      Skin: Warm and dry without trophic changes noted. Sun exposure type changes.    Musculoskeletal: exam reveals no obvious joint deformities.   Neurologically:  Mental status: The patient is awake, alert and oriented in all 4 spheres. His immediate and remote memory, attention, language skills and fund of knowledge are appropriate, wife provides some details of the history.  There is no evidence of aphasia, agnosia, apraxia or anomia. Speech is clear with normal prosody and enunciation. Thought process is linear. Mood is normal and affect is normal.        01/26/2022    8:16 AM  MMSE - Mini Mental State Exam  Orientation to time 4  Orientation to Place 5  Registration 3  Attention/ Calculation 5  Recall 3  Language- name 2 objects 2  Language- repeat 1  Language- follow 3 step command 2  Language- read & follow direction 1  Write a sentence 1  Copy design 1  Total score 28      On 01/26/2022: CDT: 4/4, AFT: 12/min.   Cranial nerves II - XII are as described above under HEENT exam.  Motor exam: Normal bulk, and tone is noted. There is no tremor. Fine motor skills and coordination: grossly intact.  Cerebellar testing: No dysmetria or intention tremor. There is no truncal or gait ataxia.  Sensory exam: intact to light touch.  Gait, station and balance: He stands easily. No veering to one side is noted. No leaning to one side is noted. Posture is age-appropriate and stance is narrow based. Gait shows normal stride length and normal pace. No problems turning are noted, no obvious limp, no walking aid.              Assessment and Plan:  In summary, DENZIL MCEACHRON is a very pleasant 77 year old right-handed gentleman with an underlying medical history of diabetes, R pontine stroke, hyperlipidemia, memory loss and overweight state, who presents for follow-up consultation of his cognitive concerns as well as  sleep apnea, on BiPAP therapy, after interim home sleep testing and starting a new BiPAP machine.  He has had no recurrence of stroke symptoms, he has had cognitive complaints  for the past couple years.  He originally had a split-night study in April 2019 and started on CPAP therapy but could not tolerated.  He has been able to tolerate BiPAP therapy and is on a new machine with good tolerance and likes the new fullface mask from [employer], F40.  He is commended for his treatment adherence.  The increase in leak lately may be due to the headgear stretching. We did a brain MRI in June 2022, which showed stable findings. MMSE in May 2023 was benign, neuropsychological evaluation with Dr. Kieth Brightly in November and December 2023 supported diagnosis of cognitive impairment.  Today he is advised to start generic Namenda 10 mg strength half a pill twice daily for the first month and then increase to 1 pill twice daily thereafter.  I would like to avoid donepezil because of risk for bradycardia and his heart rate is already mildly low.  He is not on a beta-blocker.  We talked about expectations and limitations and possible common side effects of memantine.  He is willing to proceed.  His wife is supportive as well.  We talked about the importance of maintaining a healthy lifestyle, good nutrition, good hydration and physical activity.  He tends to stay active.  Wife adds that he has had some mood irritability but this can be secondary to frustration.  He is not depressed appearing and denies any issues with depression or major anxiety.  He is encouraged to continue with his BiPAP consistently and follow-up routinely in this clinic in about 6 months, we will start with Namenda today.  We may consider a repeat MMSE at the next appointment.  He is advised to follow-up with Ihor Austin, NP next time. I answered all the questions today and the patient and his wife are in agreement.  I spent 45 minutes in total face-to-face time  and in reviewing records during pre-charting, more than 50% of which was spent in counseling and coordination of care, reviewing test results, reviewing medications and treatment regimen and/or in discussing or reviewing the diagnosis of memory loss, stroke, sleep apnea, the prognosis and treatment options. Pertinent laboratory and imaging test results that were available during this visit with the patient were reviewed by me and considered in my medical decision making (see chart for details).

## 2023-06-14 NOTE — Patient Instructions (Addendum)
It was nice to see you again.  You are compliant with your BiPAP, keep up the good work! Try to hydrate well with water, 6-8 cups/day are recommended, 8 oz size each.  We will reach out to [facility] about your headgear/mask dilemma.  Please continue using your BiPAP regularly. While your insurance requires that you use BiPAP at least 4 hours each night on 70% of the nights, I recommend, that you not skip any nights and use it throughout the night if you can. Getting used to BiPAP and staying with the treatment long term does take time and patience and discipline. Untreated obstructive sleep apnea when it is moderate to severe can have an adverse impact on cardiovascular health and raise her risk for heart disease, arrhythmias, hypertension, congestive heart failure, stroke and diabetes. Untreated obstructive sleep apnea causes sleep disruption, nonrestorative sleep, and sleep deprivation. This can have an impact on your day to day functioning and cause daytime sleepiness and impairment of cognitive function, memory loss, mood disturbance, and problems focussing. Using BiPAP regularly can improve these symptoms.   We will start you for your memory on Namenda (generic name: Memantine), starting at 5 mg twice daily with gradual buildup to 10 mg twice daily. You will start the 10 mg pill and take 1/2 pill twice daily for the first month and then go to 1 pill twice daily thereafter.   Please note that side effects may include, but are not limited to: nausea, confusion, hallucination, personality changes. If you are having mild side effects, try to stick with the treatment as these initial side effects may go away after the first 10-14 days.     We are avoiding Aricept (donepezil), as your heart rate is below 60 (aka bradycardia); your pulse was 50/min today.

## 2023-06-15 NOTE — Telephone Encounter (Signed)
[facility] confirmed receipt of order.  

## 2023-06-22 DIAGNOSIS — Z85828 Personal history of other malignant neoplasm of skin: Secondary | ICD-10-CM | POA: Diagnosis not present

## 2023-06-22 DIAGNOSIS — C44319 Basal cell carcinoma of skin of other parts of face: Secondary | ICD-10-CM | POA: Diagnosis not present

## 2023-06-23 ENCOUNTER — Encounter: Payer: Self-pay | Admitting: Neurology

## 2023-06-28 ENCOUNTER — Other Ambulatory Visit: Payer: Medicare Other

## 2023-06-28 DIAGNOSIS — E119 Type 2 diabetes mellitus without complications: Secondary | ICD-10-CM

## 2023-06-28 DIAGNOSIS — Z0000 Encounter for general adult medical examination without abnormal findings: Secondary | ICD-10-CM

## 2023-06-28 LAB — COMPREHENSIVE METABOLIC PANEL
ALT: 22 U/L (ref 0–53)
AST: 18 U/L (ref 0–37)
Albumin: 4 g/dL (ref 3.5–5.2)
Alkaline Phosphatase: 47 U/L (ref 39–117)
BUN: 15 mg/dL (ref 6–23)
CO2: 28 meq/L (ref 19–32)
Calcium: 10 mg/dL (ref 8.4–10.5)
Chloride: 101 meq/L (ref 96–112)
Creatinine, Ser: 0.85 mg/dL (ref 0.40–1.50)
GFR: 82.91 mL/min (ref >60.00)
Glucose, Bld: 125 mg/dL — ABNORMAL HIGH (ref 70–99)
Potassium: 4.3 meq/L (ref 3.5–5.1)
Sodium: 136 meq/L (ref 135–145)
Total Bilirubin: 0.6 mg/dL (ref 0.2–1.2)
Total Protein: 7 g/dL (ref 6.0–8.3)

## 2023-06-28 LAB — LIPID PANEL
Cholesterol: 134 mg/dL (ref 0–200)
HDL: 37.7 mg/dL — ABNORMAL LOW (ref >39.00)
LDL Cholesterol: 79 mg/dL (ref 0–99)
NonHDL: 96.36
Total CHOL/HDL Ratio: 4
Triglycerides: 87 mg/dL (ref 0.0–149.0)
VLDL: 17.4 mg/dL (ref 0.0–40.0)

## 2023-06-28 LAB — MICROALBUMIN / CREATININE URINE RATIO
Creatinine,U: 86.9 mg/dL
Microalb Creat Ratio: 1.3 mg/g (ref 0.0–30.0)
Microalb, Ur: 1.2 mg/dL (ref 0.0–1.9)

## 2023-06-28 LAB — HEMOGLOBIN A1C: Hgb A1c MFr Bld: 6.7 % — ABNORMAL HIGH (ref 4.6–6.5)

## 2023-07-05 ENCOUNTER — Encounter: Payer: Self-pay | Admitting: Family Medicine

## 2023-07-05 ENCOUNTER — Ambulatory Visit: Payer: Medicare Other | Admitting: Family Medicine

## 2023-07-05 VITALS — BP 136/64 | HR 65 | Temp 98.0°F | Ht 65.75 in | Wt 160.5 lb

## 2023-07-05 DIAGNOSIS — E785 Hyperlipidemia, unspecified: Secondary | ICD-10-CM | POA: Diagnosis not present

## 2023-07-05 DIAGNOSIS — E119 Type 2 diabetes mellitus without complications: Secondary | ICD-10-CM | POA: Diagnosis not present

## 2023-07-05 DIAGNOSIS — Z23 Encounter for immunization: Secondary | ICD-10-CM | POA: Diagnosis not present

## 2023-07-05 DIAGNOSIS — I10 Essential (primary) hypertension: Secondary | ICD-10-CM | POA: Diagnosis not present

## 2023-07-05 MED ORDER — ATORVASTATIN CALCIUM 80 MG PO TABS: 80.0000 mg | ORAL_TABLET | Freq: Every day | ORAL | 3 refills | Status: DC

## 2023-07-05 MED ORDER — LISINOPRIL 2.5 MG PO TABS: 2.5000 mg | ORAL_TABLET | Freq: Every day | ORAL | 3 refills | Status: DC

## 2023-07-05 MED ORDER — CLOPIDOGREL BISULFATE 75 MG PO TABS: 75.0000 mg | ORAL_TABLET | Freq: Every day | ORAL | 3 refills | Status: DC

## 2023-07-05 NOTE — Addendum Note (Signed)
Addended by: Christy Sartorius on: 07/05/2023 08:44 AM   Modules accepted: Orders

## 2023-07-05 NOTE — Progress Notes (Signed)
Established Patient Office Visit  Subjective   Patient ID: James Pugh, male    DOB: 1946/05/26  Age: 77 y.o. MRN: [id number]  No chief complaint on file.   HPI   James Pugh is seen for medical follow-up.  He has past history of CVA, hypertension, hyperlipidemia, type 2 diabetes which has been controlled without medication, history of obstructive sleep apnea, and cognitive impairment followed by neurology and currently treated with Namenda 10 mg twice daily.  His other medications include atorvastatin 80 mg daily, Plavix 75 mg daily, and lisinopril 2.5 mg daily.  Generally doing well.  Stays active with managing his house here and also has a home in [place].  Spends a lot of time volunteering with their church.  He does have some cognitive impairment and struggles with short-term memory.  He seemed a little uncertain regarding whether he was taken Namenda.  This was prescribed per neurology.  He had labs done a week ago and these were reviewed with patient.  LDL cholesterol 79.  Liver panel normal.  Electrolytes stable.  A1c 6.7%.  Urine microalbumin screen normal.  He does need flu vaccine.  He thinks he has had Shingrix.  We have documentation of 1 previous Shingrix vaccine.  Pneumonia vaccines complete.  Aged out of further colonoscopy.  He states he had diabetic eye exam within the past year but we have no documentation  Past Medical History:  Diagnosis Date   CERUMEN IMPACTION 08/22/2009   HYPERLIPIDEMIA 06/06/2009   PREDIABETES 07/13/2010   Stroke [facility])    Past Surgical History:  Procedure Laterality Date   TONSILLECTOMY      reports that he quit smoking about 39 years ago. His smoking use included cigarettes. He started smoking about 59 years ago. He has a 20 pack-year smoking history. He has never used smokeless tobacco. He reports that he does not drink alcohol and does not use drugs. family history includes Alcohol abuse in an other family member; Diabetes in his father;  Heart disease in his father; Stroke in his brother and father. No Known Allergies  Review of Systems  Constitutional:  Negative for malaise/fatigue and weight loss.  Eyes:  Negative for blurred vision.  Respiratory:  Negative for shortness of breath.   Cardiovascular:  Negative for chest pain.  Gastrointestinal:  Negative for abdominal pain.  Genitourinary:  Negative for dysuria.  Neurological:  Negative for dizziness, weakness and headaches.      Objective:     BP 136/64 (BP Location: Left Arm, Patient Position: Sitting, Cuff Size: Normal)   Pulse 65   Temp 98 F (36.7 C) (Oral)   Ht 5' 5.75" (1.67 m)   Wt 160 lb 8 oz (72.8 kg)   SpO2 97%   BMI 26.10 kg/m  BP Readings from Last 3 Encounters:  07/05/23 136/64  06/14/23 112/65  01/27/23 125/67   Wt Readings from Last 3 Encounters:  07/05/23 160 lb 8 oz (72.8 kg)  06/14/23 158 lb 12.8 oz (72 kg)  03/21/23 158 lb (71.7 kg)      Physical Exam Vitals reviewed.  Constitutional:      Appearance: He is well-developed.  HENT:     Right Ear: External ear normal.     Left Ear: External ear normal.  Eyes:     Pupils: Pupils are equal, round, and reactive to light.  Neck:     Thyroid: No thyromegaly.  Cardiovascular:     Rate and Rhythm: Normal rate and regular rhythm.  Pulmonary:     Effort: Pulmonary effort is normal. No respiratory distress.     Breath sounds: Normal breath sounds. No wheezing or rales.  Musculoskeletal:     Cervical back: Neck supple.     Right lower leg: No edema.     Left lower leg: No edema.  Skin:    Comments: Feet reveal no skin lesions. Good distal foot pulses. Good capillary refill. No calluses. Normal sensation with monofilament testing   Neurological:     Mental Status: He is alert and oriented to person, place, and time.      No results found for any visits on 07/05/23.  Last CBC Lab Results  Component Value Date   WBC 4.7 06/28/2022   HGB 12.8 (L) 06/28/2022   HCT 37.3 (L)  06/28/2022   MCV 88.6 06/28/2022   MCH 30.4 05/28/2022   RDW 14.1 06/28/2022   PLT 255.0 06/28/2022   Last metabolic panel Lab Results  Component Value Date   GLUCOSE 125 (H) 06/28/2023   NA 136 06/28/2023   K 4.3 06/28/2023   CL 101 06/28/2023   CO2 28 06/28/2023   BUN 15 06/28/2023   CREATININE 0.85 06/28/2023   GFR 82.91 06/28/2023   CALCIUM 10.0 06/28/2023   PROT 7.0 06/28/2023   ALBUMIN 4.0 06/28/2023   BILITOT 0.6 06/28/2023   ALKPHOS 47 06/28/2023   AST 18 06/28/2023   ALT 22 06/28/2023   ANIONGAP 8 05/28/2022   Last lipids Lab Results  Component Value Date   CHOL 134 06/28/2023   HDL 37.70 (L) 06/28/2023   LDLCALC 79 06/28/2023   LDLDIRECT 121.0 11/04/2014   TRIG 87.0 06/28/2023   CHOLHDL 4 06/28/2023   Last hemoglobin A1c Lab Results  Component Value Date   HGBA1C 6.7 (H) 06/28/2023      The ASCVD Risk score (Arnett DK, et al., 2019) failed to calculate for the following reasons:   The patient has a prior MI or stroke diagnosis    Assessment & Plan:   Problem List Items Addressed This Visit       Unprioritized   Dyslipidemia   Relevant Medications   atorvastatin (LIPITOR) 80 MG tablet   Diabetes mellitus type 2 in nonobese (HCC)   Relevant Medications   lisinopril (ZESTRIL) 2.5 MG tablet   atorvastatin (LIPITOR) 80 MG tablet   Benign essential HTN - Primary   Relevant Medications   lisinopril (ZESTRIL) 2.5 MG tablet   atorvastatin (LIPITOR) 80 MG tablet  Chronic medical problems as above.  Blood pressure stable.  Recent LDL cholesterol 79.  He also has cognitive impairment treated with Namenda and followed by neurology.  We discussed the following issues  -Recommend flu vaccine and patient consents -Refilled his regular medications for 1 year -Did discuss possible addition of Zetia to bring LDL less than 70 but he declines.  We did recommend low saturated fat diet with handout given. -Continue yearly diabetic eye exam -Set up 19-month  follow-up and recheck A1c at that time  Return in about 6 months (around 01/03/2024).    Evelena Peat, MD

## 2023-07-06 ENCOUNTER — Other Ambulatory Visit: Payer: Self-pay | Admitting: Neurology

## 2023-07-20 DIAGNOSIS — M9903 Segmental and somatic dysfunction of lumbar region: Secondary | ICD-10-CM | POA: Diagnosis not present

## 2023-07-20 DIAGNOSIS — M5431 Sciatica, right side: Secondary | ICD-10-CM | POA: Diagnosis not present

## 2023-08-19 DIAGNOSIS — Z961 Presence of intraocular lens: Secondary | ICD-10-CM | POA: Diagnosis not present

## 2023-08-19 DIAGNOSIS — H53001 Unspecified amblyopia, right eye: Secondary | ICD-10-CM | POA: Diagnosis not present

## 2023-08-19 DIAGNOSIS — H52203 Unspecified astigmatism, bilateral: Secondary | ICD-10-CM | POA: Diagnosis not present

## 2023-08-31 DIAGNOSIS — L578 Other skin changes due to chronic exposure to nonionizing radiation: Secondary | ICD-10-CM | POA: Diagnosis not present

## 2023-08-31 DIAGNOSIS — Z85828 Personal history of other malignant neoplasm of skin: Secondary | ICD-10-CM | POA: Diagnosis not present

## 2023-08-31 DIAGNOSIS — D1801 Hemangioma of skin and subcutaneous tissue: Secondary | ICD-10-CM | POA: Diagnosis not present

## 2023-08-31 DIAGNOSIS — L812 Freckles: Secondary | ICD-10-CM | POA: Diagnosis not present

## 2023-08-31 DIAGNOSIS — L821 Other seborrheic keratosis: Secondary | ICD-10-CM | POA: Diagnosis not present

## 2023-09-20 DIAGNOSIS — M5431 Sciatica, right side: Secondary | ICD-10-CM | POA: Diagnosis not present

## 2023-09-20 DIAGNOSIS — M9903 Segmental and somatic dysfunction of lumbar region: Secondary | ICD-10-CM | POA: Diagnosis not present

## 2023-09-21 DIAGNOSIS — M9903 Segmental and somatic dysfunction of lumbar region: Secondary | ICD-10-CM | POA: Diagnosis not present

## 2023-09-21 DIAGNOSIS — M5431 Sciatica, right side: Secondary | ICD-10-CM | POA: Diagnosis not present

## 2023-09-26 DIAGNOSIS — M9903 Segmental and somatic dysfunction of lumbar region: Secondary | ICD-10-CM | POA: Diagnosis not present

## 2023-09-26 DIAGNOSIS — M5431 Sciatica, right side: Secondary | ICD-10-CM | POA: Diagnosis not present

## 2023-10-04 ENCOUNTER — Encounter: Payer: Medicare Other | Admitting: Psychology

## 2023-12-28 NOTE — Progress Notes (Unsigned)
 Marland Kitchen

## 2023-12-29 ENCOUNTER — Encounter: Payer: Self-pay | Admitting: Adult Health

## 2023-12-29 ENCOUNTER — Ambulatory Visit (INDEPENDENT_AMBULATORY_CARE_PROVIDER_SITE_OTHER): Payer: Medicare Other | Admitting: Adult Health

## 2023-12-29 VITALS — BP 129/60 | HR 59 | Ht 66.0 in | Wt 160.0 lb

## 2023-12-29 DIAGNOSIS — G4733 Obstructive sleep apnea (adult) (pediatric): Secondary | ICD-10-CM | POA: Diagnosis not present

## 2023-12-29 DIAGNOSIS — I6350 Cerebral infarction due to unspecified occlusion or stenosis of unspecified cerebral artery: Secondary | ICD-10-CM

## 2023-12-29 DIAGNOSIS — R4189 Other symptoms and signs involving cognitive functions and awareness: Secondary | ICD-10-CM | POA: Diagnosis not present

## 2023-12-29 MED ORDER — MEMANTINE HCL ER 28 MG PO CP24: 28.0000 mg | ORAL_CAPSULE | Freq: Every day | ORAL | 5 refills | Status: DC

## 2023-12-29 NOTE — Patient Instructions (Addendum)
 Your Plan:  Will change Namenda IR to XR 28 mg daily  Continue nightly use of BiPAP for adequate sleep apnea management  Continue to follow with DME [employer] for any needed supplies or CPAP related concerns  Continue routine follow-up with your PCP for stroke risk factor management    Follow-up in 1 year or call earlier if needed     Thank you for coming to see us  at [facility]. I hope we have been able to provide you high quality care today.  You may receive a patient satisfaction survey over the next few weeks. We would appreciate your feedback and comments so that we may continue to improve ourselves and the health of our patients.

## 2024-01-13 ENCOUNTER — Ambulatory Visit (INDEPENDENT_AMBULATORY_CARE_PROVIDER_SITE_OTHER): Admitting: Family Medicine

## 2024-01-13 ENCOUNTER — Encounter: Payer: Self-pay | Admitting: Family Medicine

## 2024-01-13 VITALS — BP 122/50 | HR 54 | Temp 97.7°F | Wt 154.4 lb

## 2024-01-13 DIAGNOSIS — R5383 Other fatigue: Secondary | ICD-10-CM | POA: Diagnosis not present

## 2024-01-13 DIAGNOSIS — E119 Type 2 diabetes mellitus without complications: Secondary | ICD-10-CM

## 2024-01-13 DIAGNOSIS — G4733 Obstructive sleep apnea (adult) (pediatric): Secondary | ICD-10-CM

## 2024-01-13 LAB — TSH: TSH: 0.72 u[IU]/mL (ref 0.35–5.50)

## 2024-01-13 LAB — VITAMIN B12: Vitamin B-12: 239 pg/mL (ref 211–911)

## 2024-01-13 LAB — COMPREHENSIVE METABOLIC PANEL WITH GFR
ALT: 21 U/L (ref 0–53)
AST: 22 U/L (ref 0–37)
Albumin: 4.1 g/dL (ref 3.5–5.2)
Alkaline Phosphatase: 47 U/L (ref 39–117)
BUN: 21 mg/dL (ref 6–23)
CO2: 28 meq/L (ref 19–32)
Calcium: 9.5 mg/dL (ref 8.4–10.5)
Chloride: 102 meq/L (ref 96–112)
Creatinine, Ser: 1.07 mg/dL (ref 0.40–1.50)
GFR: 65.96 mL/min (ref >60.00)
Glucose, Bld: 111 mg/dL — ABNORMAL HIGH (ref 70–99)
Potassium: 4.7 meq/L (ref 3.5–5.1)
Sodium: 136 meq/L (ref 135–145)
Total Bilirubin: 0.9 mg/dL (ref 0.2–1.2)
Total Protein: 6.6 g/dL (ref 6.0–8.3)

## 2024-01-13 LAB — HEMOGLOBIN A1C: Hgb A1c MFr Bld: 7 % — ABNORMAL HIGH (ref 4.6–6.5)

## 2024-01-13 NOTE — Patient Instructions (Signed)
 Try to increase sleep to 7 to 9 hours per night.

## 2024-01-13 NOTE — Progress Notes (Signed)
 Established Patient Office Visit  Subjective   Patient ID: James Pugh, male    DOB: March 17, 1946  Age: 78 y.o. MRN: [id number]  Chief Complaint  Patient presents with   Fatigue    Patient complains of fatigue, x2 weeks    HPI   James Pugh 78 years old with history of CVA, hypertension, obstructive sleep apnea, type 2 diabetes, dyslipidemia, mild cognitive impairment.  He is seen with nonspecific symptoms of fatigue past few weeks or so.  Sleeping okay but generally gets about 6 to 7 hours at night.  He feels like the quality of sleep he gets is fair.  He uses CPAP regularly.  Denies any recent illness.  No chest pains.  No dyspnea.  Appetite and weight stable.  Stays generally very active with doing projects around the house.  Current medications reviewed and include Namenda , lisinopril , Plavix , atorvastatin .  No significant myalgias.  Mood stable.  Past Medical History:  Diagnosis Date   CERUMEN IMPACTION 08/22/2009   HYPERLIPIDEMIA 06/06/2009   PREDIABETES 07/13/2010   Stroke [facility])    Past Surgical History:  Procedure Laterality Date   TONSILLECTOMY      reports that he quit smoking about 40 years ago. His smoking use included cigarettes. He started smoking about 60 years ago. He has a 20 pack-year smoking history. He has never used smokeless tobacco. He reports that he does not drink alcohol and does not use drugs. family history includes Alcohol abuse in an other family member; Diabetes in his father; Heart disease in his father; Stroke in his brother and father. No Known Allergies  Review of Systems  Constitutional:  Positive for malaise/fatigue. Negative for chills and fever.  Eyes:  Negative for blurred vision.  Respiratory:  Negative for shortness of breath.   Cardiovascular:  Negative for chest pain.  Gastrointestinal:  Negative for abdominal pain.  Genitourinary:  Negative for dysuria.  Neurological:  Negative for dizziness, focal weakness, weakness and headaches.       Objective:     BP (!) 122/50 (BP Location: Left Arm, Patient Position: Sitting, Cuff Size: Normal)   Pulse (!) 54   Temp 97.7 F (36.5 C) (Oral)   Wt 154 lb 6.4 oz (70 kg)   SpO2 96%   BMI 24.92 kg/m  BP Readings from Last 3 Encounters:  01/13/24 (!) 122/50  12/29/23 129/60  07/05/23 136/64   Wt Readings from Last 3 Encounters:  01/13/24 154 lb 6.4 oz (70 kg)  12/29/23 160 lb (72.6 kg)  07/05/23 160 lb 8 oz (72.8 kg)      Physical Exam Vitals reviewed.  Constitutional:      General: He is not in acute distress.    Appearance: He is well-developed. He is not ill-appearing.  Eyes:     Pupils: Pupils are equal, round, and reactive to light.  Neck:     Thyroid : No thyromegaly.  Cardiovascular:     Rate and Rhythm: Normal rate and regular rhythm.  Pulmonary:     Effort: Pulmonary effort is normal. No respiratory distress.     Breath sounds: Normal breath sounds. No wheezing or rales.  Musculoskeletal:     Cervical back: Neck supple.     Right lower leg: No edema.     Left lower leg: No edema.  Neurological:     Mental Status: He is alert and oriented to person, place, and time.      No results found for any visits on 01/13/24.  Last  CBC Lab Results  Component Value Date   WBC 4.7 06/28/2022   HGB 12.8 (L) 06/28/2022   HCT 37.3 (L) 06/28/2022   MCV 88.6 06/28/2022   MCH 30.4 05/28/2022   RDW 14.1 06/28/2022   PLT 255.0 06/28/2022   Last metabolic panel Lab Results  Component Value Date   GLUCOSE 125 (H) 06/28/2023   NA 136 06/28/2023   K 4.3 06/28/2023   CL 101 06/28/2023   CO2 28 06/28/2023   BUN 15 06/28/2023   CREATININE 0.85 06/28/2023   GFR 82.91 06/28/2023   CALCIUM  10.0 06/28/2023   PROT 7.0 06/28/2023   ALBUMIN 4.0 06/28/2023   BILITOT 0.6 06/28/2023   ALKPHOS 47 06/28/2023   AST 18 06/28/2023   ALT 22 06/28/2023   ANIONGAP 8 05/28/2022   Last lipids Lab Results  Component Value Date   CHOL 134 06/28/2023   HDL 37.70 (L)  06/28/2023   LDLCALC 79 06/28/2023   LDLDIRECT 121.0 11/04/2014   TRIG 87.0 06/28/2023   CHOLHDL 4 06/28/2023   Last hemoglobin A1c Lab Results  Component Value Date   HGBA1C 6.7 (H) 06/28/2023   Last thyroid  functions Lab Results  Component Value Date   TSH 0.318 (L) 05/24/2018      The ASCVD Risk score (Arnett DK, et al., 2019) failed to calculate for the following reasons:   Risk score cannot be calculated because patient has a medical history suggesting prior/existing ASCVD    Assessment & Plan:   Problem List Items Addressed This Visit       Unprioritized   OSA (obstructive sleep apnea) - Primary   Diabetes mellitus type 2 in nonobese (HCC)   Relevant Orders   Hemoglobin A1c   Other Visit Diagnoses       Fatigue, unspecified type       Relevant Orders   CBC with Differential/Platelet   TSH   CMP   Vitamin B12     Patient seen with 2 to 3-week history of nonspecific fatigue.  Feels like he is sleeping generally well but only gets about 6 hours at night due to his choice.  Etiology unclear.  He does have multiple chronic problems as above.  He does have obstructive sleep apnea but does use CPAP regularly.  We suggested the following:  -Try to increase sleep to 7 to 9 hours at night if possible - Check screening labs as above - Try to reduce high glycemic foods especially in view of his type 2 diabetes. - Continue to use his CPAP consistently  No follow-ups on file.    Glean Lamy, MD

## 2024-01-14 LAB — CBC WITH DIFFERENTIAL/PLATELET
Basophils Absolute: 0.1 10*3/uL (ref 0.0–0.1)
Basophils Relative: 1 % (ref 0.0–3.0)
Eosinophils Absolute: 0.2 10*3/uL (ref 0.0–0.7)
Eosinophils Relative: 3 % (ref 0.0–5.0)
HCT: 37.9 % — ABNORMAL LOW (ref 39.0–52.0)
Hemoglobin: 12.7 g/dL — ABNORMAL LOW (ref 13.0–17.0)
Lymphocytes Relative: 26.4 % (ref 12.0–46.0)
Lymphs Abs: 1.6 10*3/uL (ref 0.7–4.0)
MCHC: 33.5 g/dL (ref 30.0–36.0)
MCV: 90.9 fl (ref 78.0–100.0)
Monocytes Absolute: 0.6 10*3/uL (ref 0.1–1.0)
Monocytes Relative: 10.5 % (ref 3.0–12.0)
Neutro Abs: 3.6 10*3/uL (ref 1.4–7.7)
Neutrophils Relative %: 59.1 % (ref 43.0–77.0)
Platelets: 261 10*3/uL (ref 150.0–400.0)
RBC: 4.18 Mil/uL — ABNORMAL LOW (ref 4.22–5.81)
RDW: 14.7 % (ref 11.5–15.5)
WBC: 6 10*3/uL (ref 4.0–10.5)

## 2024-01-15 ENCOUNTER — Other Ambulatory Visit: Payer: Self-pay | Admitting: Neurology

## 2024-01-20 ENCOUNTER — Other Ambulatory Visit: Payer: Self-pay | Admitting: Adult Health

## 2024-02-28 DIAGNOSIS — L570 Actinic keratosis: Secondary | ICD-10-CM | POA: Diagnosis not present

## 2024-02-28 DIAGNOSIS — H02412 Mechanical ptosis of left eyelid: Secondary | ICD-10-CM | POA: Diagnosis not present

## 2024-02-28 DIAGNOSIS — H50111 Monocular exotropia, right eye: Secondary | ICD-10-CM | POA: Diagnosis not present

## 2024-02-28 DIAGNOSIS — H02421 Myogenic ptosis of right eyelid: Secondary | ICD-10-CM | POA: Diagnosis not present

## 2024-02-28 DIAGNOSIS — L853 Xerosis cutis: Secondary | ICD-10-CM | POA: Diagnosis not present

## 2024-02-28 DIAGNOSIS — D1801 Hemangioma of skin and subcutaneous tissue: Secondary | ICD-10-CM | POA: Diagnosis not present

## 2024-02-28 DIAGNOSIS — H02422 Myogenic ptosis of left eyelid: Secondary | ICD-10-CM | POA: Diagnosis not present

## 2024-02-28 DIAGNOSIS — H02831 Dermatochalasis of right upper eyelid: Secondary | ICD-10-CM | POA: Diagnosis not present

## 2024-02-28 DIAGNOSIS — H02423 Myogenic ptosis of bilateral eyelids: Secondary | ICD-10-CM | POA: Diagnosis not present

## 2024-02-28 DIAGNOSIS — H02411 Mechanical ptosis of right eyelid: Secondary | ICD-10-CM | POA: Diagnosis not present

## 2024-02-28 DIAGNOSIS — H02834 Dermatochalasis of left upper eyelid: Secondary | ICD-10-CM | POA: Diagnosis not present

## 2024-02-28 DIAGNOSIS — H53483 Generalized contraction of visual field, bilateral: Secondary | ICD-10-CM | POA: Diagnosis not present

## 2024-02-28 DIAGNOSIS — H02413 Mechanical ptosis of bilateral eyelids: Secondary | ICD-10-CM | POA: Diagnosis not present

## 2024-02-28 DIAGNOSIS — H57813 Brow ptosis, bilateral: Secondary | ICD-10-CM | POA: Diagnosis not present

## 2024-02-28 DIAGNOSIS — H0279 Other degenerative disorders of eyelid and periocular area: Secondary | ICD-10-CM | POA: Diagnosis not present

## 2024-02-28 DIAGNOSIS — L821 Other seborrheic keratosis: Secondary | ICD-10-CM | POA: Diagnosis not present

## 2024-02-28 DIAGNOSIS — Z85828 Personal history of other malignant neoplasm of skin: Secondary | ICD-10-CM | POA: Diagnosis not present

## 2024-04-09 DIAGNOSIS — H53483 Generalized contraction of visual field, bilateral: Secondary | ICD-10-CM | POA: Diagnosis not present

## 2024-04-11 ENCOUNTER — Telehealth: Payer: Self-pay | Admitting: Family Medicine

## 2024-04-11 DIAGNOSIS — E785 Hyperlipidemia, unspecified: Secondary | ICD-10-CM

## 2024-04-11 DIAGNOSIS — E119 Type 2 diabetes mellitus without complications: Secondary | ICD-10-CM

## 2024-04-11 NOTE — Telephone Encounter (Signed)
 Patient's wife states the patient usually gets his lab work done a week before his yearly appt.  She is wanting these entered in the system.  If this is not ok, please let me k now and I will call her back.

## 2024-04-12 ENCOUNTER — Telehealth: Payer: Self-pay | Admitting: *Deleted

## 2024-04-12 NOTE — Telephone Encounter (Signed)
 Please see previous encounter

## 2024-04-12 NOTE — Telephone Encounter (Signed)
 I spoke with the patient's wife and she inquired if A1C could be added to labs?

## 2024-04-12 NOTE — Telephone Encounter (Signed)
 Copied from CRM [id number]. Topic: Clinical - Request for Lab/Test Order >> Apr 12, 2024  9:08 AM Chasity T wrote: Reason for CRM: Macario wife of patient is requesting for Mykal to contact her back regarding labs for husband on her cell phone.

## 2024-04-12 NOTE — Telephone Encounter (Signed)
 Labs placed. Left detailed message on patient vm informing him of this and to call to schedule lab appt.

## 2024-04-16 NOTE — Telephone Encounter (Signed)
 I spoke with patient's wife and she would like lab ordered. Labs placed.

## 2024-04-16 NOTE — Addendum Note (Signed)
 Addended by: METTA KRISTEN CROME on: 04/16/2024 08:58 AM   Modules accepted: Orders

## 2024-05-18 ENCOUNTER — Telehealth: Payer: Self-pay | Admitting: Family Medicine

## 2024-05-18 MED ORDER — CEFUROXIME AXETIL 500 MG PO TABS: 500.0000 mg | ORAL_TABLET | Freq: Two times a day (BID) | ORAL | 0 refills | Status: AC

## 2024-05-18 MED ORDER — NIRMATRELVIR/RITONAVIR (PAXLOVID)TABLET: 3.0000 | ORAL_TABLET | Freq: Two times a day (BID) | ORAL | 0 refills | Status: AC

## 2024-05-18 NOTE — Telephone Encounter (Signed)
 He and his wife Macario both began to feel ill yesterday, and today they have the same symptoms including fever, body aches, ST, and a dry cough. No chest pain or NVD. We saw his wife in our clinic today, and she tested positive for both Covid and for Strep pharyngitis. She was treated with Paxlovid  and Cefuoxime. Since James Pugh most likely has the same thing, we will treat him with these medications as well (apparently no appointments were available with us  until next week).

## 2024-05-21 ENCOUNTER — Ambulatory Visit: Admitting: Family Medicine

## 2024-06-12 ENCOUNTER — Other Ambulatory Visit: Payer: Self-pay | Admitting: Adult Health

## 2024-06-29 ENCOUNTER — Other Ambulatory Visit

## 2024-06-29 DIAGNOSIS — E119 Type 2 diabetes mellitus without complications: Secondary | ICD-10-CM | POA: Diagnosis not present

## 2024-06-29 DIAGNOSIS — E785 Hyperlipidemia, unspecified: Secondary | ICD-10-CM

## 2024-06-29 LAB — LIPID PANEL
Cholesterol: 141 mg/dL (ref 0–200)
HDL: 38.4 mg/dL — ABNORMAL LOW (ref >39.00)
LDL Cholesterol: 90 mg/dL (ref 0–99)
NonHDL: 102.84
Total CHOL/HDL Ratio: 4
Triglycerides: 65 mg/dL (ref 0.0–149.0)
VLDL: 13 mg/dL (ref 0.0–40.0)

## 2024-06-29 LAB — HEMOGLOBIN A1C: Hgb A1c MFr Bld: 7.2 % — ABNORMAL HIGH (ref 4.6–6.5)

## 2024-07-01 ENCOUNTER — Ambulatory Visit: Payer: Self-pay | Admitting: Family Medicine

## 2024-07-06 ENCOUNTER — Telehealth: Payer: Self-pay | Admitting: Family Medicine

## 2024-07-06 ENCOUNTER — Encounter: Payer: Self-pay | Admitting: Family Medicine

## 2024-07-06 ENCOUNTER — Ambulatory Visit: Admitting: Family Medicine

## 2024-07-06 VITALS — BP 116/56 | HR 62 | Temp 98.0°F | Ht 65.75 in | Wt 157.7 lb

## 2024-07-06 DIAGNOSIS — Z8673 Personal history of transient ischemic attack (TIA), and cerebral infarction without residual deficits: Secondary | ICD-10-CM

## 2024-07-06 DIAGNOSIS — E785 Hyperlipidemia, unspecified: Secondary | ICD-10-CM

## 2024-07-06 DIAGNOSIS — E119 Type 2 diabetes mellitus without complications: Secondary | ICD-10-CM | POA: Diagnosis not present

## 2024-07-06 DIAGNOSIS — I10 Essential (primary) hypertension: Secondary | ICD-10-CM | POA: Diagnosis not present

## 2024-07-06 MED ORDER — EZETIMIBE 10 MG PO TABS: 10.0000 mg | ORAL_TABLET | Freq: Every day | ORAL | 3 refills | Status: AC

## 2024-07-06 NOTE — Progress Notes (Signed)
 Established Patient Office Visit  Subjective   Patient ID: James Pugh, male    DOB: 04-10-1946  Age: 78 y.o. MRN: [id number]  Chief Complaint  Patient presents with   Annual Exam    HPI   James Pugh is here for annual medical follow-up.  He has history of CVA, hypertension, hyperlipidemia, type 2 diabetes.  Also has history of borderline low B12.  Not clear if he is taking B12 supplement currently.  He had recent labs significant for A1c 7.2% and LDL cholesterol 90.  His current medications include atorvastatin  80 mg daily, Plavix  75 mg daily, lisinopril  2.5 mg daily, Namenda  XR 28 mg daily.  He is followed by neurology regarding his chronic cognitive impairment.  He had previous neuropsychological testing 11/23 with Dr. 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  Stays quite active.  Denies any recent falls.  No chest pains.  No focal weakness.  No recent speech changes.  Flu vaccine already given.  Past Medical History:  Diagnosis Date   CERUMEN IMPACTION 08/22/2009   HYPERLIPIDEMIA 06/06/2009   PREDIABETES 07/13/2010   Stroke [facility])    Past Surgical History:  Procedure Laterality Date   TONSILLECTOMY      reports that he quit smoking about 40 years ago. His smoking use included cigarettes. He started smoking about 60 years ago. He has a 20 pack-year smoking history. He has never used smokeless tobacco. He reports that he does not drink alcohol and does not use drugs. family history includes Alcohol abuse in an other family member; Diabetes in his father; Heart disease in his father; Stroke in his brother and father. No Known Allergies  Review of Systems  Constitutional:  Negative for chills, fever and malaise/fatigue.  Eyes:  Negative for blurred vision.  Respiratory:  Negative for shortness of breath.   Cardiovascular:  Negative for chest pain.  Gastrointestinal:  Negative  for abdominal pain.  Genitourinary:  Negative for dysuria.  Neurological:  Negative for dizziness, speech change, focal weakness, weakness and headaches.      Objective:     BP (!) 116/56   Pulse 62   Temp 98 F (36.7 C) (Oral)   Ht 5' 5.75 (1.67 m)   Wt 157 lb 11.2 oz (71.5 kg)   SpO2 95%   BMI 25.65 kg/m  BP Readings from Last 3 Encounters:  07/06/24 (!) 116/56  01/13/24 (!) 122/50  12/29/23 129/60   Wt Readings from Last 3 Encounters:  07/06/24 157 lb 11.2 oz (71.5 kg)  01/13/24 154 lb 6.4 oz (70 kg)  12/29/23 160 lb (72.6 kg)      Physical Exam Vitals reviewed.  Constitutional:      General: He is not in acute distress.    Appearance: He is not ill-appearing.  Cardiovascular:     Rate and Rhythm: Normal rate and regular rhythm.  Pulmonary:     Effort: Pulmonary effort is normal.     Breath sounds: Normal breath sounds. No wheezing or rales.  Musculoskeletal:     Right lower leg: No edema.     Left lower leg: No edema.  Skin:    Comments: Feet reveal no skin lesions. Good distal foot pulses. Good capillary refill. No calluses. Normal sensation with monofilament testing   Neurological:     Mental Status: He is alert.      No results found for any visits on 07/06/24.  Last  CBC Lab Results  Component Value Date   WBC 6.0 01/13/2024   HGB 12.7 (L) 01/13/2024   HCT 37.9 (L) 01/13/2024   MCV 90.9 01/13/2024   MCH 30.4 05/28/2022   RDW 14.7 01/13/2024   PLT 261.0 01/13/2024   Last metabolic panel Lab Results  Component Value Date   GLUCOSE 111 (H) 01/13/2024   NA 136 01/13/2024   K 4.7 01/13/2024   CL 102 01/13/2024   CO2 28 01/13/2024   BUN 21 01/13/2024   CREATININE 1.07 01/13/2024   GFR 65.96 01/13/2024   CALCIUM  9.5 01/13/2024   PROT 6.6 01/13/2024   ALBUMIN 4.1 01/13/2024   BILITOT 0.9 01/13/2024   ALKPHOS 47 01/13/2024   AST 22 01/13/2024   ALT 21 01/13/2024   ANIONGAP 8 05/28/2022   Last lipids Lab Results  Component Value  Date   CHOL 141 06/29/2024   HDL 38.40 (L) 06/29/2024   LDLCALC 90 06/29/2024   LDLDIRECT 121.0 11/04/2014   TRIG 65.0 06/29/2024   CHOLHDL 4 06/29/2024   Last hemoglobin A1c Lab Results  Component Value Date   HGBA1C 7.2 (H) 06/29/2024      The ASCVD Risk score (Arnett DK, et al., 2019) failed to calculate for the following reasons:   The 2019 ASCVD risk score is only valid for ages 61 to 49   Risk score cannot be calculated because patient has a medical history suggesting prior/existing ASCVD    Assessment & Plan:   #1 type 2 diabetes.  Recent A1c 7.2%.  We discussed ideally would like to see this around 6.5 or less with his history of CVA.  He prefers lifestyle modification versus adding medication at this point.  Reassess in 4 months.  Check urine microalbumin screen then  #2 hypertension.  At goal.  Continue low-dose lisinopril  2.5 mg daily.  Continue low-sodium diet  #3 dyslipidemia.  Recent LDL 90.  Goal less than 70.  Patient on high-dose statin with Lipitor  80 mg daily.  Add Zetia 10 mg daily and recheck fasting lipids at 24-month follow-up  #4 history of borderline low B12.  Would recommend daily over-the-counter B12 1000 mcg and consider repeat level at 37-month follow-up  #5 history of cognitive impairment.  Possibly vascular in nature.  Patient on Namenda .  Continue close follow-up with neurology.   Wolm Scarlet, MD

## 2024-07-06 NOTE — Telephone Encounter (Signed)
 Mrs Grissinger is concerned about the cholesterol medication that has been prescribed for James Pugh.  Please advise if he is to discontinue the current medication and start the new, as she is not sure.  Please advise at [phone]

## 2024-07-06 NOTE — Telephone Encounter (Signed)
 Patient's wife informed of the message below and voiced understanding

## 2024-07-06 NOTE — Patient Instructions (Signed)
 A1C 7.2 (goal < 7).   Keep sugars and starches down  Be sure to take over the counter B12 1,000 mcg daily  Continue with yearly eye exam  Adding Zetia for cholesterol (goal LDL < 70 and was 90 recently).  Set up 4 month follow up.

## 2024-07-25 DIAGNOSIS — C44319 Basal cell carcinoma of skin of other parts of face: Secondary | ICD-10-CM | POA: Diagnosis not present

## 2024-07-25 DIAGNOSIS — D048 Carcinoma in situ of skin of other sites: Secondary | ICD-10-CM | POA: Diagnosis not present

## 2024-07-25 DIAGNOSIS — C44519 Basal cell carcinoma of skin of other part of trunk: Secondary | ICD-10-CM | POA: Diagnosis not present

## 2024-07-25 DIAGNOSIS — L570 Actinic keratosis: Secondary | ICD-10-CM | POA: Diagnosis not present

## 2024-07-25 DIAGNOSIS — D485 Neoplasm of uncertain behavior of skin: Secondary | ICD-10-CM | POA: Diagnosis not present

## 2024-07-25 DIAGNOSIS — L812 Freckles: Secondary | ICD-10-CM | POA: Diagnosis not present

## 2024-07-25 DIAGNOSIS — D1801 Hemangioma of skin and subcutaneous tissue: Secondary | ICD-10-CM | POA: Diagnosis not present

## 2024-07-25 DIAGNOSIS — L821 Other seborrheic keratosis: Secondary | ICD-10-CM | POA: Diagnosis not present

## 2024-07-25 DIAGNOSIS — Z85828 Personal history of other malignant neoplasm of skin: Secondary | ICD-10-CM | POA: Diagnosis not present

## 2024-09-13 ENCOUNTER — Other Ambulatory Visit: Payer: Self-pay | Admitting: Family Medicine

## 2024-09-20 ENCOUNTER — Other Ambulatory Visit: Payer: Self-pay | Admitting: Family Medicine

## 2024-11-06 ENCOUNTER — Ambulatory Visit: Admitting: Family Medicine

## 2024-12-31 ENCOUNTER — Ambulatory Visit: Admitting: Neurology
